# Patient Record
Sex: Male | Born: 1940 | Race: Black or African American | Hispanic: No | Smoking: Current every day smoker
Health system: Southern US, Community
[De-identification: ages and names within clinical notes are randomized; demographics above are authoritative.]

## PROBLEM LIST (undated history)

## (undated) DIAGNOSIS — I10 Essential (primary) hypertension: Secondary | ICD-10-CM

## (undated) DIAGNOSIS — Z794 Long term (current) use of insulin: Secondary | ICD-10-CM

## (undated) DIAGNOSIS — I219 Acute myocardial infarction, unspecified: Secondary | ICD-10-CM

## (undated) DIAGNOSIS — E1165 Type 2 diabetes mellitus with hyperglycemia: Secondary | ICD-10-CM

## (undated) DIAGNOSIS — C61 Malignant neoplasm of prostate: Secondary | ICD-10-CM

## (undated) DIAGNOSIS — R740 Nonspecific elevation of levels of transaminase and lactic acid dehydrogenase [LDH]: Secondary | ICD-10-CM

## (undated) DIAGNOSIS — E11621 Type 2 diabetes mellitus with foot ulcer: Secondary | ICD-10-CM

## (undated) DIAGNOSIS — R7401 Elevation of levels of liver transaminase levels: Secondary | ICD-10-CM

## (undated) DIAGNOSIS — I509 Heart failure, unspecified: Secondary | ICD-10-CM

## (undated) DIAGNOSIS — I2510 Atherosclerotic heart disease of native coronary artery without angina pectoris: Secondary | ICD-10-CM

## (undated) DIAGNOSIS — IMO0001 Reserved for inherently not codable concepts without codable children: Secondary | ICD-10-CM

## (undated) DIAGNOSIS — L97519 Non-pressure chronic ulcer of other part of right foot with unspecified severity: Secondary | ICD-10-CM

## (undated) DIAGNOSIS — I998 Other disorder of circulatory system: Secondary | ICD-10-CM

## (undated) DIAGNOSIS — I739 Peripheral vascular disease, unspecified: Secondary | ICD-10-CM

## (undated) DIAGNOSIS — I4891 Unspecified atrial fibrillation: Secondary | ICD-10-CM

## (undated) DIAGNOSIS — E785 Hyperlipidemia, unspecified: Secondary | ICD-10-CM

## (undated) HISTORY — PX: OTHER SURGICAL HISTORY: SHX169

## (undated) HISTORY — DX: Type 2 diabetes mellitus with hyperglycemia: E11.65

## (undated) HISTORY — DX: Reserved for inherently not codable concepts without codable children: IMO0001

## (undated) HISTORY — DX: Malignant neoplasm of prostate: C61

## (undated) HISTORY — DX: Hyperlipidemia, unspecified: E78.5

## (undated) HISTORY — DX: Type 2 diabetes mellitus with foot ulcer: E11.621

## (undated) HISTORY — DX: Long term (current) use of insulin: Z79.4

## (undated) HISTORY — PX: CORONARY ARTERY BYPASS GRAFT: SHX141

## (undated) HISTORY — PX: LEG AMPUTATION BELOW KNEE: SHX694

## (undated) HISTORY — DX: Non-pressure chronic ulcer of other part of right foot with unspecified severity: L97.519

## (undated) HISTORY — DX: Peripheral vascular disease, unspecified: I73.9

## (undated) HISTORY — PX: CLOSED REDUCTION CLAVICLE FRACTURE: SUR253

## (undated) HISTORY — DX: Other disorder of circulatory system: I99.8

---

## 1998-01-29 ENCOUNTER — Emergency Department (HOSPITAL_COMMUNITY): Admission: EM | Admit: 1998-01-29 | Discharge: 1998-01-29 | Payer: Self-pay | Admitting: Emergency Medicine

## 1998-05-26 ENCOUNTER — Ambulatory Visit (HOSPITAL_COMMUNITY): Admission: RE | Admit: 1998-05-26 | Discharge: 1998-05-26 | Payer: Self-pay | Admitting: Gastroenterology

## 1998-06-28 ENCOUNTER — Encounter: Payer: Self-pay | Admitting: Emergency Medicine

## 1998-06-28 ENCOUNTER — Inpatient Hospital Stay (HOSPITAL_COMMUNITY): Admission: EM | Admit: 1998-06-28 | Discharge: 1998-07-08 | Payer: Self-pay | Admitting: Emergency Medicine

## 1998-07-01 ENCOUNTER — Encounter: Payer: Self-pay | Admitting: Thoracic Surgery (Cardiothoracic Vascular Surgery)

## 1998-07-02 ENCOUNTER — Encounter: Payer: Self-pay | Admitting: Thoracic Surgery (Cardiothoracic Vascular Surgery)

## 1998-07-03 ENCOUNTER — Encounter: Payer: Self-pay | Admitting: Thoracic Surgery (Cardiothoracic Vascular Surgery)

## 1998-07-04 ENCOUNTER — Encounter: Payer: Self-pay | Admitting: Emergency Medicine

## 1998-07-05 ENCOUNTER — Encounter: Payer: Self-pay | Admitting: Thoracic Surgery (Cardiothoracic Vascular Surgery)

## 1998-07-11 ENCOUNTER — Encounter: Payer: Self-pay | Admitting: Thoracic Surgery (Cardiothoracic Vascular Surgery)

## 1998-07-11 ENCOUNTER — Emergency Department (HOSPITAL_COMMUNITY): Admission: EM | Admit: 1998-07-11 | Discharge: 1998-07-11 | Payer: Self-pay | Admitting: Emergency Medicine

## 1998-07-12 ENCOUNTER — Inpatient Hospital Stay (HOSPITAL_COMMUNITY)
Admission: AD | Admit: 1998-07-12 | Discharge: 1998-07-14 | Payer: Self-pay | Admitting: Thoracic Surgery (Cardiothoracic Vascular Surgery)

## 2001-04-02 ENCOUNTER — Emergency Department (HOSPITAL_COMMUNITY): Admission: EM | Admit: 2001-04-02 | Discharge: 2001-04-02 | Payer: Self-pay | Admitting: *Deleted

## 2001-10-25 ENCOUNTER — Emergency Department (HOSPITAL_COMMUNITY): Admission: EM | Admit: 2001-10-25 | Discharge: 2001-10-25 | Payer: Self-pay | Admitting: Emergency Medicine

## 2001-10-28 ENCOUNTER — Encounter: Payer: Self-pay | Admitting: Internal Medicine

## 2001-10-28 ENCOUNTER — Inpatient Hospital Stay (HOSPITAL_COMMUNITY): Admission: AD | Admit: 2001-10-28 | Discharge: 2001-11-01 | Payer: Self-pay | Admitting: Internal Medicine

## 2002-08-31 ENCOUNTER — Emergency Department (HOSPITAL_COMMUNITY): Admission: EM | Admit: 2002-08-31 | Discharge: 2002-08-31 | Payer: Self-pay | Admitting: Emergency Medicine

## 2003-03-22 ENCOUNTER — Emergency Department (HOSPITAL_COMMUNITY): Admission: AD | Admit: 2003-03-22 | Discharge: 2003-03-22 | Payer: Self-pay | Admitting: Family Medicine

## 2003-04-19 ENCOUNTER — Emergency Department (HOSPITAL_COMMUNITY): Admission: EM | Admit: 2003-04-19 | Discharge: 2003-04-19 | Payer: Self-pay | Admitting: Emergency Medicine

## 2004-02-08 ENCOUNTER — Emergency Department (HOSPITAL_COMMUNITY): Admission: EM | Admit: 2004-02-08 | Discharge: 2004-02-08 | Payer: Self-pay | Admitting: Emergency Medicine

## 2004-02-18 ENCOUNTER — Emergency Department (HOSPITAL_COMMUNITY): Admission: EM | Admit: 2004-02-18 | Discharge: 2004-02-18 | Payer: Self-pay | Admitting: Emergency Medicine

## 2004-03-02 ENCOUNTER — Ambulatory Visit (HOSPITAL_COMMUNITY): Admission: RE | Admit: 2004-03-02 | Discharge: 2004-03-02 | Payer: Self-pay | Admitting: Cardiology

## 2005-05-15 ENCOUNTER — Encounter: Admission: RE | Admit: 2005-05-15 | Discharge: 2005-05-15 | Payer: Self-pay | Admitting: Gastroenterology

## 2005-08-31 ENCOUNTER — Encounter: Admission: RE | Admit: 2005-08-31 | Discharge: 2005-08-31 | Payer: Self-pay | Admitting: Orthopedic Surgery

## 2005-09-05 ENCOUNTER — Encounter: Admission: RE | Admit: 2005-09-05 | Discharge: 2005-09-05 | Payer: Self-pay | Admitting: Orthopedic Surgery

## 2005-09-13 ENCOUNTER — Inpatient Hospital Stay (HOSPITAL_COMMUNITY): Admission: RE | Admit: 2005-09-13 | Discharge: 2005-09-16 | Payer: Self-pay | Admitting: Vascular Surgery

## 2005-09-15 ENCOUNTER — Encounter (INDEPENDENT_AMBULATORY_CARE_PROVIDER_SITE_OTHER): Payer: Self-pay | Admitting: *Deleted

## 2005-10-03 ENCOUNTER — Inpatient Hospital Stay (HOSPITAL_COMMUNITY): Admission: RE | Admit: 2005-10-03 | Discharge: 2005-10-09 | Payer: Self-pay | Admitting: Orthopedic Surgery

## 2005-10-20 ENCOUNTER — Inpatient Hospital Stay (HOSPITAL_COMMUNITY): Admission: RE | Admit: 2005-10-20 | Discharge: 2005-10-25 | Payer: Self-pay | Admitting: Orthopedic Surgery

## 2005-10-20 ENCOUNTER — Encounter (INDEPENDENT_AMBULATORY_CARE_PROVIDER_SITE_OTHER): Payer: Self-pay | Admitting: Specialist

## 2005-10-23 ENCOUNTER — Ambulatory Visit: Payer: Self-pay | Admitting: Physical Medicine & Rehabilitation

## 2006-09-13 ENCOUNTER — Ambulatory Visit (HOSPITAL_COMMUNITY): Admission: RE | Admit: 2006-09-13 | Discharge: 2006-09-13 | Payer: Self-pay | Admitting: Orthopedic Surgery

## 2007-06-26 ENCOUNTER — Encounter: Admission: RE | Admit: 2007-06-26 | Discharge: 2007-07-17 | Payer: Self-pay | Admitting: Orthopedic Surgery

## 2007-12-25 ENCOUNTER — Encounter: Admission: RE | Admit: 2007-12-25 | Discharge: 2008-03-16 | Payer: Self-pay | Admitting: Orthopedic Surgery

## 2008-03-18 ENCOUNTER — Ambulatory Visit (HOSPITAL_COMMUNITY): Admission: RE | Admit: 2008-03-18 | Discharge: 2008-03-19 | Payer: Self-pay | Admitting: Orthopedic Surgery

## 2008-06-03 ENCOUNTER — Inpatient Hospital Stay (HOSPITAL_COMMUNITY): Admission: RE | Admit: 2008-06-03 | Discharge: 2008-06-06 | Payer: Self-pay | Admitting: Orthopedic Surgery

## 2008-08-10 ENCOUNTER — Encounter (HOSPITAL_BASED_OUTPATIENT_CLINIC_OR_DEPARTMENT_OTHER): Admission: RE | Admit: 2008-08-10 | Discharge: 2008-11-08 | Payer: Self-pay | Admitting: Internal Medicine

## 2008-08-12 ENCOUNTER — Inpatient Hospital Stay (HOSPITAL_COMMUNITY): Admission: EM | Admit: 2008-08-12 | Discharge: 2008-08-16 | Payer: Self-pay | Admitting: Emergency Medicine

## 2008-08-12 ENCOUNTER — Ambulatory Visit: Payer: Self-pay | Admitting: Cardiology

## 2008-08-14 ENCOUNTER — Encounter (INDEPENDENT_AMBULATORY_CARE_PROVIDER_SITE_OTHER): Payer: Self-pay | Admitting: Internal Medicine

## 2008-08-14 ENCOUNTER — Ambulatory Visit: Payer: Self-pay | Admitting: Vascular Surgery

## 2008-10-21 ENCOUNTER — Ambulatory Visit: Admission: RE | Admit: 2008-10-21 | Discharge: 2008-10-21 | Payer: Self-pay | Admitting: General Surgery

## 2008-10-21 ENCOUNTER — Ambulatory Visit: Payer: Self-pay | Admitting: Vascular Surgery

## 2008-11-10 ENCOUNTER — Encounter (HOSPITAL_BASED_OUTPATIENT_CLINIC_OR_DEPARTMENT_OTHER): Admission: RE | Admit: 2008-11-10 | Discharge: 2008-12-04 | Payer: Self-pay | Admitting: General Surgery

## 2008-11-11 ENCOUNTER — Ambulatory Visit (HOSPITAL_COMMUNITY): Admission: RE | Admit: 2008-11-11 | Discharge: 2008-11-11 | Payer: Self-pay | Admitting: General Surgery

## 2008-12-05 ENCOUNTER — Encounter (HOSPITAL_BASED_OUTPATIENT_CLINIC_OR_DEPARTMENT_OTHER): Admission: RE | Admit: 2008-12-05 | Discharge: 2009-03-05 | Payer: Self-pay | Admitting: General Surgery

## 2009-02-08 ENCOUNTER — Ambulatory Visit: Admission: RE | Admit: 2009-02-08 | Discharge: 2009-04-21 | Payer: Self-pay | Admitting: Radiation Oncology

## 2009-03-17 ENCOUNTER — Encounter (HOSPITAL_BASED_OUTPATIENT_CLINIC_OR_DEPARTMENT_OTHER): Admission: RE | Admit: 2009-03-17 | Discharge: 2009-04-19 | Payer: Self-pay | Admitting: General Surgery

## 2009-04-12 LAB — URINALYSIS, MICROSCOPIC - CHCC
Bilirubin (Urine): NEGATIVE
Ketones: NEGATIVE mg/dL
Protein: NEGATIVE mg/dL
Specific Gravity, Urine: 1.02 (ref 1.003–1.035)

## 2009-04-14 LAB — URINE CULTURE

## 2009-04-20 ENCOUNTER — Encounter (HOSPITAL_BASED_OUTPATIENT_CLINIC_OR_DEPARTMENT_OTHER): Admission: RE | Admit: 2009-04-20 | Discharge: 2009-07-15 | Payer: Self-pay | Admitting: General Surgery

## 2009-05-05 ENCOUNTER — Ambulatory Visit (HOSPITAL_BASED_OUTPATIENT_CLINIC_OR_DEPARTMENT_OTHER): Admission: RE | Admit: 2009-05-05 | Discharge: 2009-05-05 | Payer: Self-pay | Admitting: Urology

## 2009-05-25 ENCOUNTER — Ambulatory Visit: Admission: RE | Admit: 2009-05-25 | Discharge: 2009-06-25 | Payer: Self-pay | Admitting: Radiation Oncology

## 2009-07-16 ENCOUNTER — Encounter (HOSPITAL_BASED_OUTPATIENT_CLINIC_OR_DEPARTMENT_OTHER): Admission: RE | Admit: 2009-07-16 | Discharge: 2009-10-14 | Payer: Self-pay | Admitting: General Surgery

## 2009-08-03 ENCOUNTER — Ambulatory Visit: Payer: Self-pay | Admitting: Vascular Surgery

## 2009-09-01 ENCOUNTER — Ambulatory Visit: Payer: Self-pay | Admitting: Vascular Surgery

## 2009-10-15 ENCOUNTER — Encounter: Admission: RE | Admit: 2009-10-15 | Discharge: 2009-10-15 | Payer: Self-pay | Admitting: Family Medicine

## 2009-10-26 ENCOUNTER — Encounter (HOSPITAL_BASED_OUTPATIENT_CLINIC_OR_DEPARTMENT_OTHER): Admission: RE | Admit: 2009-10-26 | Discharge: 2009-12-16 | Payer: Self-pay | Admitting: General Surgery

## 2010-07-10 LAB — GLUCOSE, CAPILLARY
Glucose-Capillary: 148 mg/dL — ABNORMAL HIGH (ref 70–99)
Glucose-Capillary: 93 mg/dL (ref 70–99)
Glucose-Capillary: 124 mg/dL — ABNORMAL HIGH (ref 70–99)

## 2010-07-21 ENCOUNTER — Emergency Department (HOSPITAL_COMMUNITY)
Admission: EM | Admit: 2010-07-21 | Discharge: 2010-07-21 | Disposition: A | Discharge status: Home / Self Care | Payer: Medicare Other | Attending: Emergency Medicine | Admitting: Emergency Medicine

## 2010-07-21 ENCOUNTER — Emergency Department (HOSPITAL_COMMUNITY): Payer: Medicare Other

## 2010-07-21 DIAGNOSIS — C61 Malignant neoplasm of prostate: Secondary | ICD-10-CM | POA: Insufficient documentation

## 2010-07-21 DIAGNOSIS — E119 Type 2 diabetes mellitus without complications: Secondary | ICD-10-CM | POA: Insufficient documentation

## 2010-07-21 DIAGNOSIS — D649 Anemia, unspecified: Secondary | ICD-10-CM | POA: Insufficient documentation

## 2010-07-21 DIAGNOSIS — I252 Old myocardial infarction: Secondary | ICD-10-CM | POA: Insufficient documentation

## 2010-07-21 DIAGNOSIS — R10815 Periumbilic abdominal tenderness: Secondary | ICD-10-CM | POA: Insufficient documentation

## 2010-07-21 DIAGNOSIS — I2510 Atherosclerotic heart disease of native coronary artery without angina pectoris: Secondary | ICD-10-CM | POA: Insufficient documentation

## 2010-07-21 DIAGNOSIS — R110 Nausea: Secondary | ICD-10-CM | POA: Insufficient documentation

## 2010-07-21 DIAGNOSIS — K921 Melena: Secondary | ICD-10-CM | POA: Insufficient documentation

## 2010-07-21 DIAGNOSIS — I10 Essential (primary) hypertension: Secondary | ICD-10-CM | POA: Insufficient documentation

## 2010-07-21 DIAGNOSIS — N419 Inflammatory disease of prostate, unspecified: Secondary | ICD-10-CM | POA: Insufficient documentation

## 2010-07-21 DIAGNOSIS — K6289 Other specified diseases of anus and rectum: Secondary | ICD-10-CM | POA: Insufficient documentation

## 2010-07-21 DIAGNOSIS — Z79899 Other long term (current) drug therapy: Secondary | ICD-10-CM | POA: Insufficient documentation

## 2010-07-21 DIAGNOSIS — R112 Nausea with vomiting, unspecified: Secondary | ICD-10-CM

## 2010-07-21 DIAGNOSIS — R109 Unspecified abdominal pain: Secondary | ICD-10-CM | POA: Insufficient documentation

## 2010-07-21 LAB — POCT CARDIAC MARKERS
CKMB, poc: 4.9 ng/mL (ref 1.0–8.0)
Myoglobin, poc: 184 ng/mL (ref 12–200)
Troponin i, poc: <0.05 ng/mL (ref 0.00–0.09)

## 2010-07-21 LAB — CBC
HCT: 31.5 % — ABNORMAL LOW (ref 39.0–52.0)
Hemoglobin: 9.9 g/dL — ABNORMAL LOW (ref 13.0–17.0)
MCH: 22.4 pg — ABNORMAL LOW (ref 26.0–34.0)
MCHC: 31.4 g/dL (ref 30.0–36.0)
MCV: 71.3 fL — ABNORMAL LOW (ref 78.0–100.0)
Platelets: 356 10*3/uL (ref 150–400)
RBC: 4.42 MIL/uL (ref 4.22–5.81)
RDW: 14.6 % (ref 11.5–15.5)
WBC: 8.3 10*3/uL (ref 4.0–10.5)

## 2010-07-21 LAB — COMPREHENSIVE METABOLIC PANEL
ALT: 18 U/L (ref 0–53)
AST: 25 U/L (ref 0–37)
Albumin: 2.6 g/dL — ABNORMAL LOW (ref 3.5–5.2)
Alkaline Phosphatase: 70 U/L (ref 39–117)
BUN: 12 mg/dL (ref 6–23)
CO2: 27 mEq/L (ref 19–32)
Calcium: 8.7 mg/dL (ref 8.4–10.5)
Chloride: 105 mEq/L (ref 96–112)
Creatinine, Ser: 1.13 mg/dL (ref 0.4–1.5)
GFR calc Af Amer: >60 mL/min (ref >60)
GFR calc non Af Amer: >60 mL/min (ref >60)
Glucose, Bld: 196 mg/dL — ABNORMAL HIGH (ref 70–99)
Potassium: 3.8 mEq/L (ref 3.5–5.1)
Sodium: 137 mEq/L (ref 135–145)
Total Bilirubin: 0.5 mg/dL (ref 0.3–1.2)
Total Protein: 6.8 g/dL (ref 6.0–8.3)

## 2010-07-21 LAB — DIFFERENTIAL
Basophils Absolute: 0 10*3/uL (ref 0.0–0.1)
Basophils Relative: 0 % (ref 0–1)
Eosinophils Absolute: 0.1 10*3/uL (ref 0.0–0.7)
Eosinophils Relative: 1 % (ref 0–5)
Lymphocytes Relative: 22 % (ref 12–46)
Lymphs Abs: 1.8 10*3/uL (ref 0.7–4.0)
Monocytes Absolute: 0.6 10*3/uL (ref 0.1–1.0)
Monocytes Relative: 7 % (ref 3–12)
Neutro Abs: 5.9 10*3/uL (ref 1.7–7.7)
Neutrophils Relative %: 71 % (ref 43–77)

## 2010-07-21 LAB — URINE MICROSCOPIC-ADD ON

## 2010-07-21 LAB — URINALYSIS, ROUTINE W REFLEX MICROSCOPIC
Bilirubin Urine: NEGATIVE
Glucose, UA: NEGATIVE mg/dL
Ketones, ur: NEGATIVE mg/dL
Leukocytes, UA: NEGATIVE
Nitrite: NEGATIVE
Protein, ur: 30 mg/dL — AB
Specific Gravity, Urine: 1.025 (ref 1.005–1.030)
Urobilinogen, UA: 1 mg/dL (ref 0.0–1.0)
pH: 6 (ref 5.0–8.0)

## 2010-07-21 LAB — GLUCOSE, CAPILLARY: Glucose-Capillary: 128 mg/dL — ABNORMAL HIGH (ref 70–99)

## 2010-07-21 LAB — OCCULT BLOOD, POC DEVICE: Fecal Occult Bld: NEGATIVE

## 2010-07-21 LAB — LIPASE, BLOOD: Lipase: 24 U/L (ref 11–59)

## 2010-07-21 MED ORDER — IOHEXOL 300 MG/ML  SOLN
100.0000 mL | Freq: Once | INTRAMUSCULAR | Status: AC | PRN
  Administered 2010-07-21: 100 mL via INTRAVENOUS

## 2010-07-22 LAB — GC/CHLAMYDIA PROBE AMP, GENITAL
Chlamydia, DNA Probe: NEGATIVE
GC Probe Amp, Genital: NEGATIVE

## 2010-07-29 LAB — GLUCOSE, CAPILLARY
Glucose-Capillary: 127 mg/dL — ABNORMAL HIGH (ref 70–99)
Glucose-Capillary: 207 mg/dL — ABNORMAL HIGH (ref 70–99)
Glucose-Capillary: 222 mg/dL — ABNORMAL HIGH (ref 70–99)
Glucose-Capillary: 246 mg/dL — ABNORMAL HIGH (ref 70–99)
Glucose-Capillary: 249 mg/dL — ABNORMAL HIGH (ref 70–99)

## 2010-07-30 LAB — GLUCOSE, CAPILLARY
Glucose-Capillary: 190 mg/dL — ABNORMAL HIGH (ref 70–99)
Glucose-Capillary: 207 mg/dL — ABNORMAL HIGH (ref 70–99)
Glucose-Capillary: 211 mg/dL — ABNORMAL HIGH (ref 70–99)
Glucose-Capillary: 221 mg/dL — ABNORMAL HIGH (ref 70–99)
Glucose-Capillary: 222 mg/dL — ABNORMAL HIGH (ref 70–99)
Glucose-Capillary: 224 mg/dL — ABNORMAL HIGH (ref 70–99)
Glucose-Capillary: 226 mg/dL — ABNORMAL HIGH (ref 70–99)
Glucose-Capillary: 231 mg/dL — ABNORMAL HIGH (ref 70–99)
Glucose-Capillary: 249 mg/dL — ABNORMAL HIGH (ref 70–99)
Glucose-Capillary: 110 mg/dL — ABNORMAL HIGH (ref 70–99)
Glucose-Capillary: 127 mg/dL — ABNORMAL HIGH (ref 70–99)
Glucose-Capillary: 135 mg/dL — ABNORMAL HIGH (ref 70–99)
Glucose-Capillary: 148 mg/dL — ABNORMAL HIGH (ref 70–99)
Glucose-Capillary: 176 mg/dL — ABNORMAL HIGH (ref 70–99)
Glucose-Capillary: 217 mg/dL — ABNORMAL HIGH (ref 70–99)
Glucose-Capillary: 218 mg/dL — ABNORMAL HIGH (ref 70–99)
Glucose-Capillary: 219 mg/dL — ABNORMAL HIGH (ref 70–99)
Glucose-Capillary: 241 mg/dL — ABNORMAL HIGH (ref 70–99)
Glucose-Capillary: 50 mg/dL — ABNORMAL LOW (ref 70–99)

## 2010-07-31 LAB — GLUCOSE, CAPILLARY
Glucose-Capillary: 100 mg/dL — ABNORMAL HIGH (ref 70–99)
Glucose-Capillary: 162 mg/dL — ABNORMAL HIGH (ref 70–99)
Glucose-Capillary: 194 mg/dL — ABNORMAL HIGH (ref 70–99)
Glucose-Capillary: 200 mg/dL — ABNORMAL HIGH (ref 70–99)

## 2010-07-31 LAB — COMPREHENSIVE METABOLIC PANEL
Albumin: 3.3 g/dL — ABNORMAL LOW (ref 3.5–5.2)
BUN: 26 mg/dL — ABNORMAL HIGH (ref 6–23)
Calcium: 8.9 mg/dL (ref 8.4–10.5)
Creatinine, Ser: 1.27 mg/dL (ref 0.4–1.5)
Glucose, Bld: 253 mg/dL — ABNORMAL HIGH (ref 70–99)
Potassium: 4.3 mEq/L (ref 3.5–5.1)
Total Protein: 7.4 g/dL (ref 6.0–8.3)

## 2010-07-31 LAB — HEMOGLOBIN A1C
Hgb A1c MFr Bld: 8.7 % — ABNORMAL HIGH (ref 4.6–6.1)
Mean Plasma Glucose: 203 mg/dL

## 2010-08-03 LAB — BRAIN NATRIURETIC PEPTIDE
Pro B Natriuretic peptide (BNP): 161 pg/mL — ABNORMAL HIGH (ref 0.0–100.0)
Pro B Natriuretic peptide (BNP): 169 pg/mL — ABNORMAL HIGH (ref 0.0–100.0)
Pro B Natriuretic peptide (BNP): 268 pg/mL — ABNORMAL HIGH (ref 0.0–100.0)

## 2010-08-03 LAB — BASIC METABOLIC PANEL
BUN: 19 mg/dL (ref 6–23)
BUN: 20 mg/dL (ref 6–23)
CO2: 24 mEq/L (ref 19–32)
CO2: 26 mEq/L (ref 19–32)
Calcium: 8.9 mg/dL (ref 8.4–10.5)
Chloride: 104 mEq/L (ref 96–112)
Chloride: 105 mEq/L (ref 96–112)
Creatinine, Ser: 1.25 mg/dL (ref 0.4–1.5)
Creatinine, Ser: 1.3 mg/dL (ref 0.4–1.5)
Creatinine, Ser: 1.41 mg/dL (ref 0.4–1.5)
GFR calc Af Amer: >60 mL/min (ref >60)
GFR calc Af Amer: >60 mL/min (ref >60)
Potassium: 3.7 mEq/L (ref 3.5–5.1)
Sodium: 136 mEq/L (ref 135–145)

## 2010-08-03 LAB — GLUCOSE, CAPILLARY
Glucose-Capillary: 105 mg/dL — ABNORMAL HIGH (ref 70–99)
Glucose-Capillary: 120 mg/dL — ABNORMAL HIGH (ref 70–99)
Glucose-Capillary: 138 mg/dL — ABNORMAL HIGH (ref 70–99)
Glucose-Capillary: 165 mg/dL — ABNORMAL HIGH (ref 70–99)
Glucose-Capillary: 166 mg/dL — ABNORMAL HIGH (ref 70–99)
Glucose-Capillary: 174 mg/dL — ABNORMAL HIGH (ref 70–99)
Glucose-Capillary: 185 mg/dL — ABNORMAL HIGH (ref 70–99)
Glucose-Capillary: 186 mg/dL — ABNORMAL HIGH (ref 70–99)
Glucose-Capillary: 207 mg/dL — ABNORMAL HIGH (ref 70–99)
Glucose-Capillary: 54 mg/dL — ABNORMAL LOW (ref 70–99)
Glucose-Capillary: 84 mg/dL (ref 70–99)
Glucose-Capillary: 85 mg/dL (ref 70–99)
Glucose-Capillary: 92 mg/dL (ref 70–99)
Glucose-Capillary: 99 mg/dL (ref 70–99)

## 2010-08-03 LAB — CARDIAC PANEL(CRET KIN+CKTOT+MB+TROPI)
CK, MB: 4.3 ng/mL — ABNORMAL HIGH (ref 0.3–4.0)
CK, MB: 4.7 ng/mL — ABNORMAL HIGH (ref 0.3–4.0)
Relative Index: 1.1 (ref 0.0–2.5)
Relative Index: 1.2 (ref 0.0–2.5)
Total CK: 381 U/L — ABNORMAL HIGH (ref 7–232)
Troponin I: 0.01 ng/mL (ref 0.00–0.06)

## 2010-08-03 LAB — WOUND CULTURE

## 2010-08-03 LAB — CK TOTAL AND CKMB (NOT AT ARMC)
CK, MB: 5.3 ng/mL — ABNORMAL HIGH (ref 0.3–4.0)
Relative Index: 1.3 (ref 0.0–2.5)
Total CK: 408 U/L — ABNORMAL HIGH (ref 7–232)

## 2010-08-03 LAB — CBC
HCT: 28.6 % — ABNORMAL LOW (ref 39.0–52.0)
Hemoglobin: 9.2 g/dL — ABNORMAL LOW (ref 13.0–17.0)
MCHC: 32.1 g/dL (ref 30.0–36.0)
MCV: 71.3 fL — ABNORMAL LOW (ref 78.0–100.0)
Platelets: 319 10*3/uL (ref 150–400)
RBC: 4.05 MIL/uL — ABNORMAL LOW (ref 4.22–5.81)
RDW: 15.9 % — ABNORMAL HIGH (ref 11.5–15.5)
WBC: 8.6 10*3/uL (ref 4.0–10.5)

## 2010-08-03 LAB — COMPREHENSIVE METABOLIC PANEL
ALT: 31 U/L (ref 0–53)
Albumin: 2.8 g/dL — ABNORMAL LOW (ref 3.5–5.2)
Alkaline Phosphatase: 59 U/L (ref 39–117)
Chloride: 111 mEq/L (ref 96–112)
Potassium: 3.6 mEq/L (ref 3.5–5.1)
Sodium: 143 mEq/L (ref 135–145)
Total Bilirubin: 0.4 mg/dL (ref 0.3–1.2)
Total Protein: 6.9 g/dL (ref 6.0–8.3)

## 2010-08-03 LAB — DIFFERENTIAL
Basophils Relative: 0 % (ref 0–1)
Eosinophils Absolute: 0.1 10*3/uL (ref 0.0–0.7)
Lymphs Abs: 1.5 10*3/uL (ref 0.7–4.0)
Monocytes Relative: 7 % (ref 3–12)
Neutro Abs: 6.3 10*3/uL (ref 1.7–7.7)
Neutrophils Relative %: 74 % (ref 43–77)

## 2010-08-03 LAB — ANAEROBIC CULTURE

## 2010-08-03 LAB — SEDIMENTATION RATE: Sed Rate: 60 mm/hr — ABNORMAL HIGH (ref 0–16)

## 2010-08-03 LAB — LIPID PANEL
Cholesterol: 137 mg/dL (ref 0–200)
LDL Cholesterol: 82 mg/dL (ref 0–99)
Triglycerides: 58 mg/dL (ref <150)

## 2010-08-03 LAB — TSH: TSH: 1.629 u[IU]/mL (ref 0.350–4.500)

## 2010-08-03 LAB — APTT: aPTT: 32 seconds (ref 24–37)

## 2010-08-03 LAB — PROTIME-INR: INR: 1 (ref 0.00–1.49)

## 2010-08-05 NOTE — Consult Note (Signed)
NAME:  Philip Strong, Philip Strong NO.:  [id number]  MEDICAL RECORD NO.:  [id number]           PATIENT TYPE:  E  LOCATION:  [facility]                         FACILITY:  [facility]  PHYSICIAN:  Luis Abed, MD, FACCDATE OF BIRTH:  [birth year]  DATE OF CONSULTATION:  07/21/2010 DATE OF DISCHARGE:  07/21/2010                                CONSULTATION   PRIMARY CARDIOLOGIST:  Previously Dr. Roger Shelter.  PRIMARY CARE PROVIDER:  Dr. Sanda Linger.  PATIENT PROFILE:  A 70 year old African American male with prior history of coronary artery disease status post coronary artery bypass grafting in 2001 as well as peripheral vascular disease, presents to the ER with a 72-month history of progressive abdominal bloating, discomfort with nausea and vomiting.  PROBLEM LIST: 1. Abdominal pain.     a.     CT of the abdomen and pelvis performed today showing no      acute findings with mild cardiomegaly.  The aorta is calcified,      but not aneurysmal. 2. Coronary artery disease.     a.     Status post MI in 2001 with subsequent coronary artery      bypass grafting in the same year.     b.     August 14, 2008, 2-D echocardiogram, EF 55%-60% with moderate      LVH, grade 2 diastolic dysfunction, mild mitral regurgitation.      Mildly reduced RV function.  Mild-to-moderate tricuspid      regurgitation.  PASP 30 mmHg. 3. Peripheral vascular disease.     a.     Status post right BKA.     b.     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  The patient      refused AKA. 4. Insulin-dependent diabetes mellitus. 5. Hyperlipidemia. 6. Morbid obesity. 7. Prostate cancer status post seed implant in January 2011 followed     by Dr. Isabel Caprice. 8. Questionable history of TIA. 9. History of surgery of the right fifth finger.  ALLERGIES:  No known drug allergies.  HISTORY OF PRESENT ILLNESS:  A 70 year old [place] American male with the above problem list.  The patient is status post MI in 2001  with subsequent coronary artery bypass grafting.  He has had limited followup with Cardiology since then.  The patient also has peripheral vascular disease, status post right BKA with chronic nonhealing ulcer.  [facility] and has not seen there since August 2011.  Also, says he has not seen his primary care provider in a while, and his pending has changed to Dr. Sanda Linger.  Over the past month, the patient has intermittent epigastric and abdominal discomfort associated with nausea and occasional vomiting as well as bloating and gassy sensation.  Symptoms can last minutes to hour and improved with meals.  Over the past 3 or 4 days, symptoms have progressed and more frequent and also more likely be associated with nausea and vomiting.  The patient also reports progressive dysuria, occasional hematuria, and rectal pain when he urinating.  He is also noted bright red  blood on toilet paper when he wipes along with bowel movement.  For all of the reasons above, the patient presented to the [facility] ED.  He has not had any chest pain, pressure, tightness, squeezing, burning, anginal equivalent.  He also denies any recent dyspnea.  Despite his BKA, he does get around with using crutches and says he does sit-ups for 1 hour each day.  Here in the ED, the patient has had a CT of his abdomen and pelvis which shows no acute findings.  There is question of a T-wave changes on an isolated on one of his three 12-leads here and we have been asked to evaluate.  HOME MEDICATIONS: 1. Klor-Con 10 mEq daily. 2. Lasix 20 mg daily. 3. Norvasc 10 mg daily. 4. Lantus 6 units b.i.d. 5. Humalog 35 units t.i.d. with meals. 6. Lisinopril 40 mg daily. 7. Lovastatin 40 mg daily. 8. Toprol-XL 100 mg daily  FAMILY HISTORY:  Mother died at 51 with history of diabetes.  Father died of unknown age with "heart problems."  His 3 sisters with diabetes, 3 brothers with  hypertension.  SOCIAL HISTORY:  The patient lives in [place] by himself.  He is unemployed.  He previously smoked half a pack a day for only 8 or 9 years and quit 6 to 7 years ago.  He has not had any alcohol recently and only ever drank socially previously.  He smokes 5 marijuana joints a year.  REVIEW OF SYSTEMS:  Positive for abdominal discomfort, nausea, vomiting, or abdominal bloating.  He has dysuria with occasional hematuria.  He has noticed bright red blood on toilet paper when he wipes.  He has also had rectal discomfort with urination.  He is full code.  Otherwise, all systems reviewed and negative.  PHYSICAL EXAMINATION:  VITAL SIGNS:  Temperature 98, heart rate 61, respirations 18, blood pressure 175/52, pulse ox 100% on room air. GENERAL:  Pleasant, African American male, in no acute distress.  Awake, alert, and oriented x3.  Has a normal affect. HEENT:  Normal nares, grossly intact, nonfocal. SKIN:  Warm and dry without lesions or masses. NECK:  Supple and obese.  Difficult to assess JVP.  No bruits. LUNGS:  Respirations are unlabored.  Clear to auscultation. CARDIAC:  Regular.  Distant S1-S2.  No S3, S4, or murmurs. ABDOMEN:  Obese, soft, with mild central abdominal tenderness with deep palpation.  Bowel sounds present x4. EXTREMITIES:  Notable for a right BKA.  His left lower extremity is somewhat tight with trace to 1+ left lower extremity edema to the mid ankle.  Left DP is 1+.  CT of the abdomen and pelvis showed no acute findings, mild cardiomegaly, aorta is calcified and nonaneurysmal.  EKG, sinus bradycardia.  He has Q in III and aVF.  No acute ST-T changes.  On one of his three EKGs here in the ER, he has biphasic T on one isolated beat in inferior leads, but rhythm strip shows that is isolated to this one beat.  Hemoglobin 9.9, hematocrit 31.5, WBC 8.3, platelets 356.  Sodium 137, potassium 3.8, chloride 105, CO2 27, BUN 12, creatinine 1.13, glucose 196,  total bilirubin 0.5, alkaline phosphatase 70, AST 25, ALT 18, total protein 6.8, albumin 2.6, lipase 24, CK-MB 4.9, troponin-I less than 0.05.  Urinalysis negative.  Calcium 8.7.  ASSESSMENT: 1. Abdominal/epigastric pain.  The patient has a 6-month history of     progressive abdominal epigastric discomfort associated with     bloating, nausea, and  vomiting.  This prompted ER visit today.  The     patient denies any recent chest pain or dyspnea.  He says he does     not have a prior anginal equivalent.  EKG has been reviewed and     that shows no evidence of ischemic changes or other objective     findings of ischemia.  Point-of-care markers are negative.     Recommend primary care and GI followup along with initiation of the     PPI as symptoms do improve with meals and are concerning for     possible peptic ulcer disease.  No further ischemic evaluation once     at this time. 2. Dysuria with rectal pain.  The patient has history of prostate     cancer.  Consider urologic evaluation. 3. History of bright red blood per rectum.  The patient says this is     present when he wipes.  He also has rectal discomfort.  Question     hemorrhoids.  Defer to primary care/GI.     Nicolasa Ducking, ANP   ______________________________ Luis Abed, MD, [facility]    CB/MEDQ  D:  07/21/2010  T:  07/22/2010  Job:  [id number]  Electronically Signed by Nicolasa Ducking ANP on 08/02/2010 04:11:59 PM Electronically Signed by Willa Rough MD FACC on 08/05/2010 02:47:45 PM

## 2010-08-09 LAB — GLUCOSE, CAPILLARY
Glucose-Capillary: 275 mg/dL — ABNORMAL HIGH (ref 70–99)
Glucose-Capillary: 280 mg/dL — ABNORMAL HIGH (ref 70–99)
Glucose-Capillary: 352 mg/dL — ABNORMAL HIGH (ref 70–99)
Glucose-Capillary: 79 mg/dL (ref 70–99)
Glucose-Capillary: 80 mg/dL (ref 70–99)
Glucose-Capillary: 83 mg/dL (ref 70–99)
Glucose-Capillary: 87 mg/dL (ref 70–99)

## 2010-08-09 LAB — COMPREHENSIVE METABOLIC PANEL
BUN: 22 mg/dL (ref 6–23)
CO2: 22 mEq/L (ref 19–32)
Chloride: 106 mEq/L (ref 96–112)
Creatinine, Ser: 1.19 mg/dL (ref 0.4–1.5)
GFR calc non Af Amer: >60 mL/min (ref >60)
Glucose, Bld: 239 mg/dL — ABNORMAL HIGH (ref 70–99)
Total Bilirubin: 0.5 mg/dL (ref 0.3–1.2)

## 2010-08-09 LAB — HEMOGLOBIN A1C: Mean Plasma Glucose: 217 mg/dL

## 2010-08-09 LAB — CBC
HCT: 30 % — ABNORMAL LOW (ref 39.0–52.0)
MCV: 72.2 fL — ABNORMAL LOW (ref 78.0–100.0)
RBC: 4.16 MIL/uL — ABNORMAL LOW (ref 4.22–5.81)
WBC: 7.8 10*3/uL (ref 4.0–10.5)

## 2010-08-09 LAB — GLUCOSE, RANDOM: Glucose, Bld: 595 mg/dL (ref 70–99)

## 2010-08-09 LAB — PROTIME-INR: Prothrombin Time: 12.9 seconds (ref 11.6–15.2)

## 2010-08-26 ENCOUNTER — Ambulatory Visit: Payer: Medicare Other | Admitting: Internal Medicine

## 2010-09-06 NOTE — Op Note (Signed)
NAME:  Philip Strong, Philip Strong NO.:  [id number]   MEDICAL RECORD NO.:  [id number]          PATIENT TYPE:  INP   LOCATION:  5031                         FACILITY:  [facility]   PHYSICIAN:  Nadara Mustard, MD     DATE OF BIRTH:  Apr 15, 1941   DATE OF PROCEDURE:  03/18/2008  DATE OF DISCHARGE:                               OPERATIVE REPORT   POSTOPERATIVE DIAGNOSIS:  Chronic wound, right transtibial amputation.   POSTOPERATIVE DIAGNOSIS:  Chronic wound, right transtibial amputation.   PROCEDURE:  1. Revision of right transtibial amputation.  2. Cultures obtained x2.  3. Wound VAC applied.   SURGEON:  Nadara Mustard, MD   ANESTHESIA:  General.   ESTIMATED BLOOD LOSS:  Minimal.   ANTIBIOTICS:  2 g of Kefzol.   DRAINS:  None.   COMPLICATIONS:  None.   SPECIMEN:  None.   DISPOSITION:  To PACU in stable condition.   INDICATIONS FOR PROCEDURE:  The patient is a 70 year old gentleman who  is status post a right transtibial amputation.  He has had chronic ulcer  which has failed to heal despite conservative wound care measures as  well as p.o. antibiotics.  He presented at this time for revision of the  amputation.  Risks and benefits were discussed including infection,  neurovascular injury, nonhealing wound, and need for additional surgery.  The patient states he understands and wished to proceed at this time.   DESCRIPTION OF PROCEDURE:  The patient was brought to OR room #1 after  undergoing a general anesthetic.  After adequate level of anesthesia  obtained, the patient's right lower extremity was prepped using DuraPrep  and draped into a sterile field.  The chronic wound was ellipsed out in  1 block of tissue.  There was nonviable muscle deep within the wound and  this was also debrided back to viable muscle edges.  There was no  purulence.  The wound was irrigated with normal saline.  Cultures were  obtained.  The wound was closed using a far-near-near-far  suture  with 2-0 nylon.  The wound was covered with a wound VAC, set to -  75 mm of continuous suction.  The patient was extubated and taken to  PACU in stable condition.  Plan for 24-hour observation.  Discharge to  home in the morning.      Nadara Mustard, MD  Electronically Signed     MVD/MEDQ  D:  03/18/2008  T:  03/18/2008  Job:  [id number]

## 2010-09-06 NOTE — Assessment & Plan Note (Signed)
[facility]   NAME:  Philip Strong, DEC NO.:  [id number]   MEDICAL RECORD NO.:  [id number]      DATE OF BIRTH:  28-Jan-1941   PHYSICIAN:  Joanne Gavel, M.D.         VISIT DATE:  10/07/2008                                   OFFICE VISIT   HISTORY OF PRESENT ILLNESS:  This 70 year old male status post right BKA  with a large wound on medial aspect of the amputation stump.  The pain  is quite severe and there has been no odor or drainage.   PHYSICAL EXAMINATION:  Temperature 97.7, pulse 72, respirations 20,  blood pressure 140/70.  The lateral wound of the stump has essentially  healed.  This is covered with a scab.  The medial wound is essentially  unchanged in size, but there appeared to be some epithelialization at  the margins.  The middle of the wound has a very thick slough.  He will  not allow me even to do try to debride this.   PLAN:  Switch from hydrogel and PROMOGRAN to Santyl.  He will change  this three times a week.  We will see him in 7 days.      Joanne Gavel, M.D.  Electronically Signed     RA/MEDQ  D:  10/07/2008  T:  10/08/2008  Job:  [id number]

## 2010-09-06 NOTE — Procedures (Signed)
DUPLEX DEEP VENOUS EXAM - LOWER EXTREMITY   INDICATION:  Nonhealing right stump.   HISTORY:  Edema:  yes  Trauma/Surgery:  yes  Pain:  yes  PE:  no  Previous DVT:  no  Anticoagulants:  Other:   DUPLEX EXAM:                CFV   SFV   PopV  PTV    GSV                R  L  R  L  R  L  R   L  R  L  Thrombosis    0     0     0  Spontaneous   +     +     +  Phasic        +     +     +  Augmentation  +     +     +  Compressible  +     +     +  Competent     +     +     +   Legend:  + - yes  o - no  p - partial  D - decreased   IMPRESSION:  No evidence of deep vein thrombosis noted in the right  stump.    _____________________________  Philip Strong, M.D.   MG/MEDQ  D:  08/03/2009  T:  08/04/2009  Job:  [id number]

## 2010-09-06 NOTE — Discharge Summary (Signed)
NAME:  EASON, HOUSMAN               ACCOUNT NO.:  [id number]   MEDICAL RECORD NO.:  [id number]          PATIENT TYPE:  INP   LOCATION:  4708                         FACILITY:  [facility]   PHYSICIAN:  Kerrin Champagne, M.D.   DATE OF BIRTH:  [birth year]   DATE OF ADMISSION:  08/12/2008  DATE OF DISCHARGE:  08/16/2008                               DISCHARGE SUMMARY   ADMISSION DIAGNOSES:  1. 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  2. Diabetes mellitus.  3. Coronary artery disease, status post coronary artery bypass graft.  4. Hypertension  5. Hyperlipidemia.  6. History of myocardial infarction.  7. History of peripheral vascular disease, status post below-knee      amputation with wound dehiscence followed by Dr. Lajoyce Corners.  8. Congestive heart failure exacerbation.   DISCHARGE DIAGNOSES:  1. 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  2. Diabetes mellitus.  3. Coronary artery disease, status post coronary artery bypass graft.  4. Hypertension  5. Hyperlipidemia.  6. History of myocardial infarction.  7. History of peripheral vascular disease, status post below-knee      amputation with wound dehiscence followed by Dr. Lajoyce Corners.  8. Congestive heart failure exacerbation.  9. Pseudomonas infection to the right below-knee amputation wound.   PROCEDURE:  On August 14, 2008, the patient underwent incision and  debridement of right below-knee amputation stump wound medially  measuring 4 cm x 14 cm.  This was performed by Dr. Otelia Sergeant under general  anesthesia.   CONSULTATIONS:  1. Michiel Cowboy, MD, of Internal Medicine.  2. Di Kindle. Edilia Bo, MD, of Vascular Surgery.   BRIEF HISTORY:  The patient is a 70 year old male with known peripheral  vascular disease and diabetes  mellitus.  He underwent right great toe  amputation in June 2007, and then eventually required right below-knee  amputation.  He had stent placement in the right superficial femoral  artery by Dr. Arbie Cookey in 2007.  He has had chronic draining wound of the  right BKA.  This has been followed with Wound Care and Dr. Lajoyce Corners over the  past several months.  The patient was admitted to [facility] on August 12, 2008, when he presented to the emergency room with bilateral lower  extremity edema and exacerbation of congestive heart failure.  At that  time, he was having severe pain at the right BKA.  An exudative type of  purulent matter over the entire wound with yellow-green color was noted.  It was felt he would benefit from surgical intervention and eventually  underwent incision and debridement as stated above.   BRIEF HOSPITAL COURSE:  The patient initially was admitted by Dr.  Therisa Doyne, of [facility].  At that time, he was  evaluated for congestive heart failure with a 2-D echo, multiple labs as  well  as Lasix, strict I and O's and weight monitoring.  A consult was  obtained through the wound care nurse at [facility].  She recommended  that the patient be seen by Dr. Lajoyce Corners in consultation.  In Dr. Audrie Lia  absence, Dr. Otelia Sergeant saw the patient for evaluation of his chronic ulcer  of the right BKA.  Dr. Edilia Bo was notified for consult from a vascular  standpoint.  It was felt any vascular intervention would have little  impact on the circulation of his BKA and would be associated with  significant risk.  It was felt he would benefit from local debridement  and underwent the procedure by Dr. Otelia Sergeant on August 14, 2008.  During the  procedure, cultures were obtained.  Cultures eventually grew  Pseudomonas.  Wound care was started by the physical therapist during  the hospital stay with bedside pulsatile lavage and wet-to-dry dressing  changes.  The patient had less pain of his stump  following the  debridement.  He was seen by the physical therapist for mobility.  He  was felt stable for discharge to his home on August 16, 2008.  At that  time, he had no shortness of breath or cough.  He did remain on  telemetry and was noted to have 6 beats of V-TACH, but was asymptomatic.  His lungs were clear and heart rate was regular, otherwise.  From a  medical standpoint, his diabetes was stable as was his congestive heart  failure.  Arrangements were made through the case manager for home  health assistance for dressing changes and physical therapy.   PERTINENT LABORATORY VALUES:  On admission, the patient underwent  echocardiogram showing left ventricular ejection fraction 55-60%.  Otherwise, findings were without acute diagnosis.  EKG on admission  showed sinus rhythm, prolonged QT with no change since previous tracing  confirmed by Dr. Donette Larry.  CT scan of the right lower extremity showed  open wound along the medial aspect of the right leg with packing  material, wound in close proximity to the tibial, no abscess noted.  No  definite erosion changes of osteomyelitis, marked to diffuse osteopenia  noted.  Small wound along the lateral aspect of the BKA without  significant penetration.  CBC on admission with WBC 8.6 and repeat on  April 24 at 6.5.  Hemoglobin and hematocrit on admission 9.2 and 28.9.  Repeat values with hemoglobin 9.2 and hematocrit 28.6.  Sed rate on  April 24 was noted to be 60.  Chemistry studies on admission were within  normal limits with exception of glucose 153.  Repeat values showed  improvement in blood glucose values.  Albumin 2.8 on admission.  Hemoglobin A1c 7.4 on admission.  Cardiac markers showed CK and CK-MB,  MB mildly elevated with troponin values within normal limits.  BNP on  admission 169, repeat on April 22 was 268 and repeat on April 24 was  161.  Lipid profile showed values all within normal limits.  Wound  culture of the right BKA  with final growth of Pseudomonas aeruginosa  with no anaerobes isolated.   PLAN:  The patient was discharged to his home.  Arrangements were made  for home health wound care on a daily basis with normal saline packing.  He will follow up with Dr. Otelia Sergeant 1 week from discharge.  Follow up with  Dr. Donette Larry 1 week from discharge.  He was instructed to call the office  to arrange these appointments.  The patient will keep  his wound dry and  clean other than when he is having dressing changes by the home health  nurse.  Dietary restrictions include low-carbohydrate diet for his  diabetes.   MEDICATIONS:  1. Mevacor 40 mg daily.  2. Lasix 40 mg 2 daily for 1 week, then once daily.  3. Potassium 20 mEq daily.  4. Norvasc 10 mg one-half tablet daily.  5. Aspirin 81 mg daily.  6. Lisinopril 40 mg daily.  7. Lantus 60 units daily.  8. NovoLog sliding scale.  9. Metanx daily.  10.Percocet 1-2 every 4-6 hours as needed for pain.  11.Amaryl 2 mg daily at noon.   The patient will keep his BKA elevated.  He will call should he have  questions or concerns prior to his return office visit.   CONDITION ON DISCHARGE:  Stable.      Wende Neighbors, P.A.      Kerrin Champagne, M.D.  Electronically Signed    SMV/MEDQ  D:  09/17/2008  T:  09/18/2008  Job:  [id number]

## 2010-09-06 NOTE — H&P (Signed)
NAME:  Philip Strong, Philip Strong               ACCOUNT NO.:  [id number]   MEDICAL RECORD NO.:  [id number]          PATIENT TYPE:  INP   LOCATION:  1823                         FACILITY:  [facility]   PHYSICIAN:  Michiel Cowboy, MDDATE OF BIRTH:  Aug 12, 1940   DATE OF ADMISSION:  08/12/2008  DATE OF DISCHARGE:                              HISTORY & PHYSICAL   PRIMARY CARE Maximillion Gill:  Georgann Housekeeper, MD.   CHIEF COMPLAINT:  Shortness of breath.   The patient is a 70 year old gentleman who reports that over the past 2  weeks he had been having progressive worsening shortness of breath and  extremity edema which is somewhat improved.  He does not remember any  medications that he takes but states that he has been taking them as  prescribed.  No diet discrepancies, denies any chest pain.  He does have  a history of coronary artery disease and diabetes.  Otherwise, no  worsening shortness of breath on exertion, somewhat worsening orthopnea  though.  No abdominal swelling.  No fevers, no chills.  The patient does  endorse right leg pain.  He has a history of wound dehiscence there for  quite some time, treated repeatedly by Dr. Lajoyce Corners and followed by Dr. Lajoyce Corners  and apparently not healing well.  The patient has been recently on  antibiotics, but there is otherwise no diarrhea, no constipation.   REVIEW OF SYSTEMS:  Otherwise unremarkable.   PAST MEDICAL HISTORY:  Significant for:  1. Coronary artery disease status post CABG.  2. Diabetes.  3. Hypertension.  4. Hyperlipidemia.  5. History of myocardial infarction.  The patient does not have a      cardiologist.  6. Status post peripheral vascular disease and AKA amputation on the      right with dehiscence of the wound, followed by Dr. Lajoyce Corners.   SOCIAL HISTORY:  The patient does not smoke but does drink occasional  alcohol.  Does not abuse drugs.   FAMILY HISTORY:  Noncontributory.   ALLERGIES:  No known drug allergies.   MEDICATIONS:  The patient  takes some kind of fluid pill, he does not  know the dose.  Though, he does remember he takes Lantus 60 units in the  morning and 60 units at night and some insulin 30 units in the morning  but he cannot remember what the name of it is.   VITALS:  Temperature 98.4, blood pressure 166/70, pulse 86, respirations  20, saturating 100% on room air.  The patient appears to be in no acute  distress.  HEAD:  Nontraumatic.  Moist mucous membranes.  LUNGS:  Distant breath sounds bilaterally.  The patient is a very heavy  gentleman, but no crackles could be heard, no wheezes.  HEART:  Regular rate and rhythm, a slight systolic murmur is appreciated  about 2/6.  ABDOMEN:  Obese but nontender, nondistended.  LOWER EXTREMITIES:  Chronic edematous changes on left lower extremity  with no pitting edema but rather thickening of the skin and right lower  extremity surgically absent above the knee.  There is a poorly healed  wound,  there is some granulation tissue and somewhat foul discharge.  The patient states that has not changed for years now.  NEUROLOGIC:  Intact.  SKIN:  Significant for the wound above-mentioned.   LABS:  White blood cell count 8.6, hemoglobin 9.2, sodium 140, potassium  4.2, creatinine 1.25, BNP 169.  Chest x-ray showing sequelae of CABG,  mild of pulmonary edema and cardiomegaly, glucose 165.  Electrocardiogram showing sinus rhythm, heart rate 81.  There are Q-  waves in lead III, a small Q-wave in lead aVF.  No significant ST-  segment changes noted, no significant change from electrocardiogram on  November 2009.   ASSESSMENT AND PLAN:  1. This is a 70 year old gentleman with a past medical history      significant for coronary artery disease and diabetes who presents      with congestive heart failure exacerbation.  He does not carry      diagnosis of heart failure, per his records, but I, unfortunately,      cannot have an access to his office records.  He may just not  be      aware of his diagnosis.  He does not know any of his medications      besides his Lantus.  2. Congestive heart failure exacerbation.  Will cycle cardiac enzymes,      obtain 2-D echocardiogram, check TSH, give intravenous Lasix,      strict ins and outs, daily weight and orthostatics.  Make sure he      is on lisinopril.  Will need to restart his home medications as      soon as possible once the list is available.  3. Diabetes.  Will continue Lantus and sliding scale.  Unsure if he is      taking any oral medications for this.  4. History of hypertension.  Unsure what he takes at home but make      sure he is on lisinopril here.  5. History of coronary artery disease.  No chest pain, but the patient      is diabetic.  Will cycle cardiac enzymes since his heart failure      could be a presentation of coronary artery disease.  6. Right chronic leg wound.  Will check plain films for osteomyelitis.      Would strongly consider Dr. Audrie Lia opinion to see if his wound has      changed or not and if the patient would need a course of      antibiotics or not.  Will also call for wound consult.  7. Prophylaxis.  Protonix plus Lovenox.   Dr. Georgann Housekeeper, his primary care physician, will resume care in the  morning.      Michiel Cowboy, MD  Electronically Signed     AVD/MEDQ  D:  08/12/2008  T:  08/12/2008  Job:  [id number]   cc:   Georgann Housekeeper, MD

## 2010-09-06 NOTE — Procedures (Signed)
VASCULAR LAB EXAM   INDICATION:  Nonhealing right stump.   HISTORY:  Diabetes:  yes  Cardiac:  yes  Hypertension:  yes   EXAM:  Duplex of the right stump, history of above-knee amputation.   IMPRESSION:  Greater than 75% stenosis noted in the right mid  superficial femoral artery of the right stump.   ___________________________________________  Quita Skye. Hart Rochester, M.D.   MG/MEDQ  D:  08/03/2009  T:  08/04/2009  Job:  [id number]

## 2010-09-06 NOTE — Assessment & Plan Note (Signed)
[facility]   NAME:  Philip Strong, Philip Strong               ACCOUNT NO.:  [id number]   MEDICAL RECORD NO.:  [id number]      DATE OF BIRTH:  [birth year]   PHYSICIAN:  Joanne Gavel, M.D.         VISIT DATE:  10/14/2008                                   OFFICE VISIT   HISTORY OF PRESENT ILLNESS:  This is a 70 year old male with a right BKA  stump and a large wound.  This wound has been present for several years  has healed and broken down.   PHYSICAL EXAMINATION:  Today reveals, the lateral wound is healed.  The  medial wound is essentially unchanged.  There is some slough but  basically bright red and the patient will not allow any debridement,  even allow [place] to touch it.  He has been treated with Santyl for 7 days.   PLAN:  Continue Santyl, change 3 times a week.  I will order vascular  studies to see if there is an inflow problem.  This patient has made  very little progress in the 2 months that he has been coming to the  clinic for this particular wound.      Joanne Gavel, M.D.  Electronically Signed     RA/MEDQ  D:  10/14/2008  T:  10/14/2008  Job:  [id number]

## 2010-09-06 NOTE — Assessment & Plan Note (Signed)
[facility]   NAME:  Philip Strong, Philip Strong               ACCOUNT NO.:  [id number]   MEDICAL RECORD NO.:  [id number]      DATE OF BIRTH:  10-10-1940   PHYSICIAN:  Barry Dienes. Eloise Harman, M.D.     VISIT DATE:                                   OFFICE VISIT   SUBJECTIVE:  The patient is a 70 year old black man who was seen for  reevaluation of her right below-knee amputation stump infection.  He has  been treated with hyperbaric oxygen treatment which he is tolerating  very well.  He is enthusiastic about the progress today.  He has not had  significant pain in the area of the stump.   OBJECTIVE:  VITAL SIGNS:  Blood pressure 162/69, pulse 59, respirations  20, temperature 97.9, blood glucose level was 166.   The condition of the wound is as follows:  Wound #1:  It is located on the medial aspect of the right below-knee  amputation site just proximal to the distal most surface.  This wound  measures 12.5 cm x 2.9 cm x 0.4 cm.  The wound does not have significant  eschar but has minimal necrotic debris.  The wound base is red in color  with pink granulation tissue.  There was underlying muscle exposed but  no bone or tendon.  There was no foul odor and no significant drainage.  The periwound integrity was intact.   ASSESSMENT:  Interval improved appearance of right leg ulcer with  hyperbaric oxygen treatment.   PLAN:  After 5% lidocaine gel was applied to the ulcer, a #15-scalpel  blade was used to gently debride the necrotic slough from the wound.  This was accomplished without significant pain or discomfort.  Following  this, the wound was treated with Puracol, covered by hydrogel, covered  by a gauze, and wrapped in Kerlix.  They will be repeated in this  fashion every other day.  He will continue ongoing hyperbaric oxygen  treatments.  He should have a physician reevaluation in approximately 1  week.           ______________________________  Barry Dienes. Eloise Harman,  M.D.     DGP/MEDQ  D:  12/08/2008  T:  12/09/2008  Job:  [id number]

## 2010-09-06 NOTE — Assessment & Plan Note (Signed)
[facility]   NAME:  Philip Strong, Philip Strong               ACCOUNT NO.:  [id number]   MEDICAL RECORD NO.:  [id number]      DATE OF BIRTH:  1940-06-26   PHYSICIAN:  Jonelle Sports. Sevier, M.D.  VISIT DATE:  10/21/2008                                   OFFICE VISIT   HISTORY:  This 70 year old white male is being followed for ulcerations  of the right BKA stump, which originally had ulcerations both at the  lateral margin of the previous surgical incision and also much larger  open area immediately.  These have been treated with Santyl and then  more recently apparently with Silvadene cream.   He was seen by Dr. Wiliam Ke last week and was concerned about some problems  with vascular inflow and sent the patient for arterial studies, which  were indeed done this morning.  The sonographer's report, which has not  been reviewed and signed by the physician and which contains no  physician recommendation regarding any intervention, shows moderate  irregular plaque no noted throughout with significant stenoses in the  profunda artery, proximal to mid femoral artery and possibly the mid to  distal femoral artery.  The waveforms are monophasic from the common  femoral through the popliteal artery.   Despite this, the patient feels that the wound has gotten smaller and  this is verified as will be seen below.   PHYSICAL EXAMINATION:  VITAL SIGNS:  Blood pressure 130/87, pulse 82,  respirations 17, and temperature 98.1.   The more lateral wound has completely healed and the medial wound now  measures 3.0 x 10.0 x 0.2 cm with a good clean granular base.  This has  a significant reduction in size from his examination a week ago.   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   DISPOSITION:  Given that the patient is showing progressive improvement  more aggressive intervention is probably not indicated  at this point in  time.  Certainly, if he regresses at all, it will be an option to see if  the vascular surgeons feel like in opening of these vessels up and also  certainly the use of hyperbaric oxygen therapy would be in  consideration.   The wound cleaned with his continued improvement; however, our plan  today is simply to continue with dressing on a daily basis with  Silvadene cream, this to be applied after appropriate liquid soap  cleansing the wound on a daily basis and then this in turn covered with  a dry nonstick pad and a bulky dressing.   The patient will change this daily at home and will be seen again in 2  weeks or sooner if need be.           ______________________________  Jonelle Sports. Cheryll Cockayne, M.D.    RES/MEDQ  D:  10/21/2008  T:  10/22/2008  Job:  [id number]

## 2010-09-06 NOTE — Assessment & Plan Note (Signed)
[facility]   NAME:  Philip Strong, Philip Strong               ACCOUNT NO.:  [id number]   MEDICAL RECORD NO.:  [id number]      DATE OF BIRTH:  July 21, 1940   PHYSICIAN:  Joanne Gavel, M.D.         VISIT DATE:  09/02/2008                                   OFFICE VISIT   The patient had a BK amputation approximately 3 years ago.  The wound  has broken down several times and has closed several times.  Right now,  the patient has a sizable wound in the medial aspect of his amputation  and a much smaller wound, which is almost healed in the lateral aspect  of his amputation.  Last week, he was started on a Silvadene dressing.  I believe it will be better of with Santyl.   PLAN:  Santyl, moist gauze, and then wrap.  See in 7 days to consider  further debridement surgically.      Joanne Gavel, M.D.  Electronically Signed     RA/MEDQ  D:  09/02/2008  T:  09/02/2008  Job:  [id number]

## 2010-09-06 NOTE — Assessment & Plan Note (Signed)
[facility]   NAME:  Philip Strong, Philip Strong               ACCOUNT NO.:  [id number]   MEDICAL RECORD NO.:  [id number]      DATE OF BIRTH:  07-20-1940   PHYSICIAN:  Leonie Man, M.D.         VISIT DATE:                                   OFFICE VISIT   PROBLEM:  Wound dehiscence following right below-knee amputation with  nonhealing diabetic wound in this 70 year old man.  Curent Treatment:  Wet-to-dry saline gauze dressings and more recently, with Puracol .  The patient is also currently undergoing  hyperbaric oxygen therapy and  tolerating that well.   PHYSICAL EXAMINATION:  [name] is in no distress.  Temperature 97.8, pulse 55, respirations 18, and blood pressure 167/79.  Capillary blood glucose is 150.  The wound bed now is very clean and granulating with current wound  measurement: 3.6 x 12.5 x 0.5 cm.  Tthe wound does not require any debridement today.   TREATMENT:  Puracol, hydrogel dressings.   DISP  Sschedule Apligraft Pending approval.  I think this will more rapidly close his wound.      Leonie Man, M.D.  Electronically Signed     PB/MEDQ  D:  11/23/2008  T:  11/24/2008  Job:  [id number]

## 2010-09-06 NOTE — Assessment & Plan Note (Signed)
[facility]   NAME:  Philip Strong, Philip Strong NO.:  [id number]   MEDICAL RECORD NO.:  [id number]      DATE OF BIRTH:  09-09-1940   PHYSICIAN:  Leonie Man, M.D.         VISIT DATE:                                   OFFICE VISIT   PROBLEM:  Diabetic ulcer   HPI  Nonhealing right below-knee amputation wound in this 70 year old  diabetic man.  The patient had been treated for this stump with Santyl  wet-to-dry dressings.  He presents today again for wound evaluation.   EXAM  On examination today, he is afebrile, temperature 98.5, pulse 93,  respirations 20, blood pressure 146/76.   Capillary blood glucose is 114.  The wounds dimensions are smaller today with the right medial  transtibial wound at 4.3 x 12.5 x 0.4 cm and the lateral wound at 0.5 x  1.9 x 0.3.   </PROCEDURE/TREATMENT  Selective debridement on both these wounds for removal of a significant  amount of slough and necrotic material and fibrous tissue.  This was  debrided with a curette down to good clean granulations.   Wound dressing treatments with hydrogel and Promogran to both of these  wounds covered with a dry sterile dressing.  The patient will change  this every 3 days.   DISPOSITION:  Follow up in 1 week.      Leonie Man, M.D.  Electronically Signed     PB/MEDQ  D:  09/23/2008  T:  09/23/2008  Job:  [id number]

## 2010-09-06 NOTE — Assessment & Plan Note (Signed)
[facility]   NAME:  Philip Strong, Philip Strong               ACCOUNT NO.:  [id number]   MEDICAL RECORD NO.:  [id number]      DATE OF BIRTH:  [birth year]   PHYSICIAN:  Leonie Man, M.D.    VISIT DATE:  12/21/2008                                   OFFICE VISIT   PROBLEM:  Wound dehiscence following right below-knee amputation with  nonhealing diabetic wound in this 70 year old gentleman.  Current  treatment being hyperbaric oxygen and damp-to-dry gauze dressings.  More  recently, he has been having Puracol placed on the wound.   He comes in for reevaluation today.  His temperature is 98.3, pulse 73,  respirations 19, and blood pressure is 162/72.   The wound base is clean with no odor or any significant drainage.  I  have decided to go ahead and place an Apligraf on this wound, which was  ordered approximately 1 week ago.   However, the patient positioned supinely and the left stump area is  appropriately prepped, I prepared the Apligraf with multiple  fenestrations and applied it over the wound.  I cut off the excess and  carried it down to the remainder of the wound for complete coverage of  his stump wound.  This was covered with Mepitel and held in place with  Steri-Strips and a bulky gauze dressing was applied.  I will plan to see  the patient back in approximately 1 week for reevaluation.      Leonie Man, M.D.  Electronically Signed     PB/MEDQ  D:  12/21/2008  T:  12/22/2008  Job:  [id number]

## 2010-09-06 NOTE — Op Note (Signed)
NAME:  Philip Strong, Philip Strong               ACCOUNT NO.:  [id number]   MEDICAL RECORD NO.:  [id number]          PATIENT TYPE:  INP   LOCATION:  5008                         FACILITY:  [facility]   PHYSICIAN:  Nadara Mustard, MD     DATE OF BIRTH:  12/02/1940   DATE OF PROCEDURE:  06/03/2008  DATE OF DISCHARGE:                               OPERATIVE REPORT   PREOPERATIVE DIAGNOSIS:  Dehiscence, right transtibial amputation.   POSTOPERATIVE DIAGNOSIS:  Dehiscence, right transtibial amputation.   PROCEDURE:  Right transtibial amputation revision.   SURGEON:  Nadara Mustard, MD   ANESTHESIA:  General.   ESTIMATED BLOOD LOSS:  Minimal.   ANTIBIOTICS:  Vancomycin 1 g.   DRAINS:  None.   COMPLICATIONS:  None.   TOURNIQUET TIME:  None.   WOUND VAC:  Set at minus 75 mmHg.   DISPOSITION:  To PACU in stable condition.   INDICATIONS FOR PROCEDURE:  The patient is a 70 year old gentleman with  diabetic insensate neuropathy, status post transtibial amputation of the  right.  He has progressive wound dehiscence and breakdown and due to  failure of conservative care, the patient presents at this time for  revision transtibial amputation.  Risks and benefits were discussed  including infection, neurovascular injury, persistent pain, and need for  additional surgery.  The patient states he understands and wished to  proceed at this time.   DESCRIPTION OF PROCEDURE:  The patient was brought to OR room 1, and  underwent a general anesthetic.  After adequate level of anesthesia  obtained, the patient's right lower extremity was prepped using DuraPrep  and draped in a sterile field, and the dehiscence of the wound was  draped with an impervious Ioban, so this was not involved in the  surgical field.  An elliptical incision was made around the wound and  this was carried down to bone.  The tibia and fibula were resected  approximately 2 cm back to good healthy bone.  The entire wound was  removed in 1  block of tissue.  The wound was irrigated with normal  saline.  Hemostasis was obtained.  The wound was closed with #1 PDS with  both the deep and superficial fascial layers closed with #1 PDS.  The  skin was closed using a  modified vertical mattress suture as well as far-near, near-far suture  with 2-0 nylon.  The wound was then covered with a wound VAC sponge set  to minus 75 mmHg.  Discharged to PACU in stable condition.  Plan for  admission IV vancomycin and continuation of the wound VAC at minus 75  mmHg.      Nadara Mustard, MD  Electronically Signed     MVD/MEDQ  D:  06/03/2008  T:  06/03/2008  Job:  [id number]

## 2010-09-06 NOTE — Assessment & Plan Note (Signed)
[facility]   NAME:  Philip Strong, Philip Strong               ACCOUNT NO.:  [id number]   MEDICAL RECORD NO.:  [id number]      DATE OF BIRTH:  [birth year]   PHYSICIAN:  Joanne Gavel, M.D.         VISIT DATE:  11/11/2008                                   OFFICE VISIT   HISTORY OF PRESENT ILLNESS:  A 70 year old male with a BK stump  ulceration which has been present for several months.  He has been  treating the patient with Silvadene and just recently today has noticed  the onset of some pain.   PHYSICAL EXAMINATION:  Temperature 97.7, pulse 80, respirations 24, and  blood pressure 135/70.  The wound is 12.8 x 3.7 x 0.4.  It is bright red  in its base.  There is essentially no sign of healing.   I asked Dr. Lurene Shadow for consultation advice and he thinks we should stop  silver, change to Puracol, and set the patient up for HBO.  We will see  him in 7 days and consider HBO treatment.      Joanne Gavel, M.D.  Electronically Signed     RA/MEDQ  D:  11/11/2008  T:  11/11/2008  Job:  [id number]

## 2010-09-06 NOTE — Op Note (Signed)
NAME:  Philip Strong, Philip Strong               ACCOUNT NO.:  [id number]   MEDICAL RECORD NO.:  [id number]          PATIENT TYPE:  INP   LOCATION:  4708                         FACILITY:  [facility]   PHYSICIAN:  Kerrin Champagne, M.D.   DATE OF BIRTH:  [birth year]   DATE OF PROCEDURE:  08/14/2008  DATE OF DISCHARGE:                               OPERATIVE REPORT   PREOPERATIVE DIAGNOSIS:  Infected right below-knee amputation stump  medial wound, superficial involving skin and the fascial layers and  fatty layers.   POSTOPERATIVE DIAGNOSIS:  Infected right below-knee amputation stump  medial wound, superficial involving skin and the fascial layers and  fatty layers.   PROCEDURE:  Incision and debridement of right below-knee amputation  stump wound, medial, measuring 4 cm x 14 cm.   SURGEON:  Kerrin Champagne, MD   ANESTHESIA:  General via oral tracheal intubation, Dr. Leonides Grills.  Judie Petit.   ESTIMATED BLOOD LOSS:  25 mL.   DRAINS:  None.   TOURNIQUET TIME:  Zero minute.   BRIEF CLINICAL HISTORY:  The patient is a 70 year old male with  diabetes.  He has a history of peripheral vascular disease.  He was  treated for a right great toe amputation in June 2007, then on to right  below-knee amputation, and had stent placed in the right superficial  femoral artery by Dr. Tawanna Cooler Early in 2007.  He had problems with healing  of the wound.  He eventually went on to the wound dehiscence in November  2009.  He underwent open debridement of this area of his medial leg.  At  that time, this had persistent drainage and open wound since then a  period of nearly 6-8 months.  He is brought to the operating room as he  has been admitted to Medicine Service to undergo diuresis for anasarca  with bilateral lower extremity edema findings.  During this  hospitalization, requested consulted for evaluation of his stump.  His  attending physician, Dr. Aldean Baker is not available for evaluation.  He is found to  have an infection superficially involving the right  medial below-knee amputation stump.  He has severe pain with any  attempts at palpation or debridement of this area either in his room or  in the office.  It is felt that he required a general anesthetic in  order to undergo debridement of the open wound site.  The findings of  severe pain tended by history of peripheral vascular disease suggests  that this is a vascular type ulcer and is having difficulty healing.   INTRAOPERATIVE FINDINGS:  A exudative type purulent material over the  entire wound site with yellow green in coloration, underwent debridement  of edges and scraping of the wound down to more bleeding granulation  type tissue.  Cultures were obtained following the superficial  debridement, anaerobic and aerobic.   DESCRIPTION OF PROCEDURE:  After adequate general anesthesia, right  lower extremity was prepped with Betadine scrub and prep solution and  draped in the usual manner to the level of the knee.  Right leg was then  debrided  over the medial ulcer extending from the medial aspect of the  calf about 3 cm distal to the knee joint line across the anterior aspect  of the stump distally and medially.  The entire length of this open  wound is now 4 cm x 14 cm.  The entire length of this was debrided  sharply using a #10 blade scalpel and curettage using about a 1/4 inch  to 1/2 inch curette down to bleeding granulation tissue.  Following  this, cultures were obtained both anaerobic and aerobic.  The patient  then had a dressing of normal saline wet-to-dry applied.  Dressing of 4  x 4s soaked with saline applied to the open wound site and ABDs fixed to  the skin with Kerlix.  The patient was then reactivated, extubated, and  returned to the recovery room in satisfactory condition.      Kerrin Champagne, M.D.  Electronically Signed     JEN/MEDQ  D:  08/14/2008  T:  08/15/2008  Job:  [id number]

## 2010-09-06 NOTE — Discharge Summary (Signed)
NAME:  BRANDOL, CORP               ACCOUNT NO.:  [id number]   MEDICAL RECORD NO.:  [id number]          PATIENT TYPE:  INP   LOCATION:  4708                         FACILITY:  [facility]   PHYSICIAN:  Georgann Housekeeper, MD      DATE OF BIRTH:  06-09-1940   DATE OF ADMISSION:  08/12/2008  DATE OF DISCHARGE:  08/16/2008                               DISCHARGE SUMMARY   DISCHARGE DIAGNOSES:  1. Volume overload diastolic dysfunction.  2. Leg edema.  3. Right below-knee amputation with stump wound, slow to heal, status      post surgical debridement by Orthopedic.  4. Renal insufficiency.  5. History of coronary artery disease.  6. Hypertension.  7. History of diabetes.   DISCHARGE MEDICATIONS:  1. Mevacor 40 mg daily.  2. Lasix 40 mg b.i.d. for 1 week, then daily.  3. Potassium 20 mEq daily.  4. Norvasc 5 mg daily.  5. Aspirin 81 mg daily.  6. Lisinopril 40 mg daily.  7. Lantus 50 units b.i.d.  8. NovoLog sliding scale with meals.  9. Metanx 1 tablet daily.  10.Percocet 1-2 q.6 p.r.n.  11.Amaryl 2 mg 1 tablet at noon.   Continue wound care as per home health and Wound Clinic to the right  BKA.   CONSULTATION:  Orthopedic.   PROCEDURES:  A 2-D echo with results suggesting LV function 55-60% with  diastolic defects consistent with diastolic dysfunction.   LABORATORY DATA:  His white count was normal.  Hemoglobin 9.2,  creatinine 1.4.  LFTs normal.  Hemoglobin A1c 7.4.  Cardiac markers  negative.  Cholesterol total 137, LDL 82, HDL 43.  TSH 1.6.   HOSPITAL COURSE:  A 70 year old male admitted with leg edema worsening,  no shortness of breath.  1. History of CAD, underwent 2-D echo.  He ruled out for an MI.  Echo      showed diastolic dysfunction, was put on IV Lasix and then switched      to p.o., did well with the swelling.  He still has significant      edema on the right thigh around the stump and the wound.  Continued      on Lasix and potassium.  Electrolytes remained stable.  2. His right BKA continues to have poor wound healing.  He had been      consulted by Vascular and did not think any vascular intervention      at this point.  He was seen by Orthopedic Dr. Otelia Sergeant for Dr. Lajoyce Corners,      who is his primary orthopedic and underwent some surgical cleaning      and debridement.  No evidence of any infection.  He has continued      with the wound care as well [facility] Clinic for followup of the wound.      He tolerated the procedure well in the OR.  Please refer to the      orthopedic note for details.  3. As far as the diabetes, continue on Lantus and sliding scale.  A1c      was 7.4.  4. History  of hypertension.  Blood pressure remained stable in 130s to      150s as the patient was discharged home in stable and improved      condition.  Followup outpatient with the [facility]ound Clinic as well as      Orthopedic Dr. Otelia Sergeant or Dr. Lajoyce Corners, and I will see him back in 1-2      weeks.      Georgann Housekeeper, MD  Electronically Signed     KH/MEDQ  D:  09/23/2008  T:  09/23/2008  Job:  [id number]

## 2010-09-06 NOTE — Assessment & Plan Note (Signed)
[facility]   NAME:  Philip Strong, Philip Strong               ACCOUNT NO.:  [id number]   MEDICAL RECORD NO.:  [id number]      DATE OF BIRTH:  January 07, 1941   PHYSICIAN:  Lenon Curt. Chilton Si, M.D.   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  The patient was subsequently admitted for an  infected right BKA stump.  He underwent operative procedure to incise  and debride the stump area by Dr. Vira Browns.  The patient remained in  the hospital in aggregate less than 1 week and now returns to Wound Care  for continued treatment.   While in the hospital, lab work indicated a hemoglobin A1c of 9.2 and a  glucose of 595.   PHYSICAL EXAMINATION:  VITAL SIGNS:  Temp 97.6, pulse 76, respirations  20, and blood pressure 126/76.  Capillary glucose 118.  EXTREMITIES:  Wound #1 in the medial right leg stump area shows a wound  5 x 14 x 0.4 cm.  There is a wound #2 laterally in the right stump area  measuring 0.2 x 2.8 x 0.3 cm.  The largest wound is wide open with fatty  tissue exposed.  It does appear reasonably clean.  There is not a lot of  debris on the wound, and there is only moderate amount of drainage.   TREATMENT:  I recultured the right stump area because of possibility of  cellulitis.  Following this, we applied Silvadene, wet gauze, a bulky  wrap, and Coban outer wrap.  This should be done daily.  The patient  states he is already on an antibiotic and is supposed to call to let us  know what he is currently using.   Because of the wide open nature of this wound, we have recommended that  he return Sep 02, 2008 for repeat evaluation.  Culture report should be  back by then, and hopefully, we will know what antibiotic he is taking.   ICD-9 #998.30 disruption of the wound.  CPT  [id number].      Lenon Curt Chilton Si, M.D.  Electronically Signed     AGG/MEDQ  D:  08/28/2008  T:  08/29/2008  Job:  [id number]

## 2010-09-06 NOTE — Assessment & Plan Note (Signed)
[facility]   NAME:  Philip Strong, Philip Strong               ACCOUNT NO.:  [id number]   MEDICAL RECORD NO.:  [id number]      DATE OF BIRTH:  Sep 12, 1940   PHYSICIAN:  Jonelle Sports. Sevier, M.D.  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  Several weeks ago, he had a  septic episode, this was treated aggressively and since that time with  conventional wound therapy.  He has shown continued improvement.  Most  recently, he has been using Silvadene on these wounds, although the  records show that our intention was that he would have been on Santyl.   PHYSICAL EXAMINATION:  Blood pressure 151/72, pulse 75, respirations not  recorded, temperature 97.7, blood glucose 97.  On the medial aspect of  his wound, he has a large opened area measuring 5.5 x 13.0 x 0.4 cm with  a reasonable granular base, but still some areas of slough.  He has  linear more lateral lesion measuring 0.6 x 2.2 x 0.3 cm that has a  considerable slough in the base.   IMPRESSION:  Continued improvement wounds of right to below-knee  amputation stump.   DISPOSITION:  The wounds today are debrided selectively of the slough  and I am able to get them reasonably clean.  I also took some crust from  the central portion of the wound, which has healed.   The wound was then dressed with an application of Santyl covered with  moist gauze and a protective wrap.   This will be changed on a daily basis at home.   The patient will complete the Cipro, which he has previously been given  having 2 days worth of therapy remaining.   Followup visit will be given here for 2 weeks sooner p.r.n.           ______________________________  Jonelle Sports. Cheryll Cockayne, M.D.     RES/MEDQ  D:  09/16/2008  T:  09/16/2008  Job:  [id number]

## 2010-09-06 NOTE — Assessment & Plan Note (Signed)
OFFICE VISIT   Philip Strong, Philip Strong  DOB:  10-20-1940                                       09/01/2009  ZJIRC#:[id number]   The patient is a 70 year old male referred by Dr. 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  The patient had a right below knee  amputation four years ago by Dr. Lajoyce Corners.  He states that this never  completely healed.  He has been undergoing local wound care since that  time.  He has undergone several treatments including a VAC which did not  help and several different types of therapy with local wound care.  He  has had no problems in the left leg.  He denies any prior interventions  in the right leg.  However, from our records he previously underwent a  right superficial femoral artery stent in May of 2007 by Dr. Arbie Cookey.  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   The patient denies any pain in his stump.  He states that the wound has  been improving over the last few weeks.  He states he has had more  improved in the last few weeks then in the last year.  He has been using  compression therapy in his right leg to decrease the amount of fluid on  the right side.  He denies any fever or chills.   CHRONIC MEDICAL PROBLEMS:  Include diabetes, hypertension, coronary  artery disease and elevated cholesterol, all of which are currently  controlled.  Past medical history is otherwise unremarkable.   PAST SURGICAL HISTORY:  He denies.   FAMILY HISTORY:  Remarkable for his mother and sisters who had diabetes  and vascular disease at a young age.   SOCIAL HISTORY:  He is single.  He has three children.  He is retired.  He is a former smoker, quit in 2000.  He does not consume alcohol  regularly.   REVIEW OF SYSTEMS:  Full twelve point review of systems was performed  with the patient  today.  Please see intake referral form for details  regarding that.   PHYSICAL EXAM:  Vital signs:  Blood pressure is 165/72 in the left arm,  heart rate is 82 and regular, respirations 16.  HEENT:  Unremarkable.  Neck:  Has 2+ carotid pulses without bruit.  Chest:  Clear to  auscultation.  Cardiac:  Exam is regular rate and rhythm without murmur.  Abdomen:  Soft, nontender, nondistended, obese, no masses.  Extremities:  He has a right below knee amputation with an open wound on the medial  aspect which measures 9 x 3 cm.  There are some areas of granulation  tissue with small islands of fibrinous exudate.  There is no purulent  discharge.  There is no surrounding erythema.  Left foot is warm and  well-perfused.  He has a 2+ right femoral pulse.  He has a 1+ left  femoral pulse.  He has absent popliteal and pedal pulses in the left  leg.  He has absent popliteal pulse in the right leg.  There is some  limb discrepancy with the right leg being approximately 10% larger than  the left secondary to edema.  Neurological:  Exam shows symmetric upper  extremity and lower extremity motor strength with no focal weakness.  Musculoskeletal:  Exam shows no major deformities other than his right  below knee amputation.  Skin:  Has no open ulcers or rashes other than  the aforementioned wound in the right below knee amputation.   I reviewed his duplex exam dated 08/03/2009.  This showed a greater than  75% stenosis of the right mid superficial femoral artery.   I had a lengthy discussion with the patient today regarding the wound in  his right leg.  He currently does not wish to have any intervention due  to the fact that he thinks the wound is improving.  I did offer him that  we could repeat his arteriogram to see if the stent in his right leg had  narrowed down and possibly increased perfusion pressure to this wound to  improve wound healing.  He currently is not interested in this since he   thinks the wound is improving.  I also discussed with him the  possibility of doing an above knee amputation to rid him of this chronic  wound on the right leg and he was also not interested in that at this  time.  Since he is currently not interested in any intervention I  believe the best option for him would be to have him return in six  months' time to reduplex his right superficial femoral artery and will  do an ABI on the left leg for followup at that time as well.  He will  continue to his local wound care as he is currently doing with  compression and local dressings.  He will continue to follow with Dr.  Lurene Shadow.     Philip Hora. Fields, MD  Electronically Signed   CEF/MEDQ  D:  09/01/2009  T:  09/02/2009  Job:  [id number]   cc:   Leonie Man, M.D.

## 2010-09-06 NOTE — Assessment & Plan Note (Signed)
[facility]   NAME:  Philip Strong, Philip Strong               ACCOUNT NO.:  [id number]   MEDICAL RECORD NO.:  [id number]      DATE OF BIRTH:  22-Jan-1941   PHYSICIAN:  Jonelle Sports. Sevier, M.D.  VISIT DATE:  11/04/2008                                   OFFICE VISIT   HISTORY:  This 70 year old black male with a BK stump ulcer on the right  lower extremity, is problematic because of known inflow blockage in that  extremity more proximally.  Because his wound is progressed despite  this, we have not moved to vascular surgical re-consultation on the one  hand or to use of HBO therapy on the other hand.  Most recently at home,  he has been cleansing the wound daily and placing Silvadene on it,  covered then with a bulky dressing.  He reports no fever, chills, odor,  drainage, or systemic symptoms.   PHYSICAL EXAMINATION:  Blood pressure 181/72, pulse 71, respirations 18,  and temperature 97.9.  The wound itself today appears essentially  unchanged, measures perhaps slightly larger in its width at 13.1 x 4.1  cm and 0.2 cm in depth.  Approximately, 90% of the base of the wound is  covered with a gelatinous very nonadherent slough.  There is no  surrounding erythema and no significant odor or drainage.   IMPRESSION:  Stable, but not progressing wound.   DISPOSITION:  The plan today will be to change the patient back to wet-  to-dry dressings of which he seems to handle more satisfactory than his  Silvadene applications.  This is done after the wound is debrided using  a curette of all the slough adhered before described.  He tolerates this  without any anesthesia.  He is treated today with a wet-to-dry dressing,  covered it with a bulky over dressing, and is instructed to change this  daily at home, cleansing the wound with warm Dial soap water, rinsing it  well, patting it dry, then reapplying wet-to-dry dressing as we have  done today.   Followup visit will be by the nurse in 1  week and by the physician in 2.           ______________________________  Jonelle Sports. Cheryll Cockayne, M.D.     RES/MEDQ  D:  11/04/2008  T:  11/04/2008  Job:  [id number]

## 2010-09-06 NOTE — Assessment & Plan Note (Signed)
[facility]   NAME:  Philip Strong, Philip Strong               ACCOUNT NO.:  [id number]   MEDICAL RECORD NO.:  [id number]      DATE OF BIRTH:  04/08/1941   PHYSICIAN:  Jonelle Sports. Sevier, M.D.  VISIT DATE:  08/12/2008                                   OFFICE VISIT   HISTORY:  This 70 year old black male was referred by Dr. Lajoyce Corners for  assistance with management of a dehisced BK amputation wound of the  right lower extremity.   The patient has a fairly complex medical history and is a very poor  historian when it comes to details.  He apparently has had hypertension  and diabetes for a number of years and a recent random glucose of 595  with hemoglobin A1c of 9.2 would suggest that his diabetes control has  been less than optimal.   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  That original wound  dehisced and in February this year was taken back to the operating room  and the wound was debrided and attempts were made again to close it or  at least to approximated and he was placed in a wound VAC.  That was  continued for a period of time, but the exact duration is uncertain.  Whatever, the wound never healed and has continued to show evidence of  some necrotic tissue and not to close.  His most recent debridement was  that done in the hospital in February with some subsequent office  debridements by Dr. Lajoyce Corners.   He arrives today for our evaluation and advice.   PAST MEDICAL HISTORY:  Significant for hypertension and diabetes as  indicated above and also he has had coronary artery bypass grafting.  Although the details are not clear by any means, apparently, there were  some attempts to diurese him because of extreme edema in the stump and  in his contralateral  leg but that resulted in some deterioration of  renal function and so was discontinued.  He was anemic on blood test  done in February of this year at the time his hospitalization and it is  not clear whether that has ever been addressed.   He denies any awareness of any chronic renal disease or chronic hepatic  disease and says that the edema on an ongoing basis has not been a  significant problem as best he can recall.   His regular medications at the moment include:  1. Glimepiride 2 mg b.i.d.  2. Lovastatin 40 mg daily.  3. Lisinopril 40 mg daily.  4. Lantus 60 units b.i.d.  5. NovoLog 32 units daily in the a.m.  6. Amlodipine 10 mg daily.  7. Klor-Con 20 mEq daily when he takes Lasix.  8. Lasix 20 mg daily, but apparently that had been stopped because of      the concern for his renal deterioration.  9. He has recently been on doxycycline 100 mg b.i.d. (no culture      results available or found in computer).  10.Oxycodone/APAP 5/500 b.i.d. as  needed.   He has no known medicinal allergies.   PHYSICAL EXAMINATION:  VITAL SIGNS:  Blood pressure is 176/71, pulse is  69, respirations 20, and temperature 97.7.  GENERAL:  This is a huge black male appearing younger than his stated  age who has significant edema of the entirety of the lower extremities  of both the problematic right extremity and the left extremity.  EXTREMITIES:  Indeed, the edema in his left calf is so tense that it  feels almost like wound.  Distal pulses not palpable in that extremity  through the edema.  The right lower extremity ends in a BK transtibial amputation which is  dehisced particularly with the suture line being significantly dehisced  in its medial portion and a little bit so at the lateral end of the  wound.  This major area of dehiscence measures 26 x 6 x 0.3 cm and is  covered with malodorous seminecrotic slough and other debris.  Some  suture material is seen no protruding from the wound.    I have listened today to his heart and he has a slow rhythm and I can  not hear a ventricular gallop.   DISPOSITION:  I initially attempted to begin some limited debridement of  the slough from his wound base, but he is extremely skittish and fearful  and would not allow this even those things that I think would not have  been associated with any significant pain whatsoever.  It thus becomes  clear that this wound cannot be debrided without some regional or more  extensive anesthesia.   On the other hand too it seems unlikely that this wound will ever heel  as long as he has the rather severe edema that he presently has.  He  admittedly has been unfaithful in wearing a recommended stump shrinker,  but I do not think that alone would be an adequate answer.   In view of the above and his unstable medical condition with unexplained  and untreated of anemia, history of renal deterioration with diuretic  use and his poorly controlled diabetes, I have called his primary  doctor, Dr. Donette Larry today and discussed with him the need for  hospitalization for medical tuning up and stabilization which should  include an echocardiogram to give [place] an ejection fraction and should  include a chest film.  Both those things would be necessary information  before we could undertake possibly hyperbaric oxygen.  Once he is  medically stable in the hospital (and Dr. Donette Larry agrees with the need  for this) and his fluid overload has been controlled, the wound will be  appropriately debrided by Dr. Lajoyce Corners again and a wound VAC immediately  applied.  He will then be returned for followup in this clinic and we  will hope that everything will allow [place] to offer him hyperbaric oxygen  therapy which is justified in the face of a failed flap if you will, the  consequences of it, its unhealing being even higher likely AK amputation  on that extremity.   Accordingly at Dr. Venita Sheffield recommendations, the patient is sent  from  here directly to the [facility] ER where he will be seen and evaluated  by an [facility] hospitalist, admitted, and Dr. Donette Larry will follow through  with recommendations as above.           ______________________________  Jonelle Sports. Cheryll Cockayne, M.D.     RES/MEDQ  D:  08/12/2008  T:  08/13/2008  Job:  [id number]   cc:   Nadara Mustard, MD  Georgann Housekeeper, MD

## 2010-09-06 NOTE — Op Note (Signed)
NAME:  Philip Strong, Philip Strong               ACCOUNT NO.:  [id number]   MEDICAL RECORD NO.:  [id number]          PATIENT TYPE:  AMB   LOCATION:  SDS                          FACILITY:  [facility]   PHYSICIAN:  Nadara Mustard, MD     DATE OF BIRTH:  1940-09-07   DATE OF PROCEDURE:  09/13/2006  DATE OF DISCHARGE:                               OPERATIVE REPORT   PREOPERATIVE DIAGNOSIS:  Nonhealing wound, right transtibial amputation.   POSTOPERATIVE DIAGNOSIS:  Nonhealing wound, right transtibial  amputation.   PROCEDURE:  1. Irrigation and debridement of skin and soft tissue, preparation of      wound bed.  2. Application of Apligraf, 2 units to 2 wounds, right transfibula      amputation.   SURGEON:  Dr. Lajoyce Corners   ANESTHESIA:  None.   DRAINS:  None.   COMPLICATIONS:  None.   DISPOSITION:  To home in stable condition.   INDICATION FOR PROCEDURE:  The patient is a 70 year old gentleman who is  status post a right transfibula amputation.  After failing all  conservative care, the wounds have progressed to the point where they  have not healed any further, and the patient presents at this time for  application of Apligraf.  Risks and benefits were discussed.  The  patient states he understands and wishes to proceed with application of  Apligraf at this time.   DESCRIPTION OF PROCEDURE:  The patient was brought to the short-stay,  and the right leg was cleansed with sterile saline, and the skin and  soft tissue was debrided back to bleeding, viable granulation tissue.  After preparation of the wound bed, the Apligraf was meshed x2 units.  Each unit was applied to each wound.  The wounds were then covered with  Mepitel Bactroban cream, 4x4's, Kerlix, and a Coban dressing.  The  patient was discharged to home in stable condition.  Follow up in the  office in 1 week to change the dressing.      Nadara Mustard, MD  Electronically Signed     MVD/MEDQ  D:  09/13/2006  T:  09/13/2006  Job:   [id number]

## 2010-09-06 NOTE — Assessment & Plan Note (Signed)
[facility]   NAME:  Philip Strong, Philip Strong               ACCOUNT NO.:  [id number]   MEDICAL RECORD NO.:  [id number]      DATE OF BIRTH:  27-Apr-1940   PHYSICIAN:  Joanne Gavel, M.D.         VISIT DATE:  09/09/2008                                   OFFICE VISIT   HISTORY OF PRESENT ILLNESS:  This is a 70 year old male status post  right BK amputation.  This wound has been nonhealing for several months.  He was seen on Sep 02, 2008 and the wound was treated with Santyl moist  gauze wrapped with Coban.  He is to be seen daily by home health care.  Today,  the wound is essentially unchanged.  There is some slough at the  base which was debrided using a curette and 5% Xylocaine ointment.  He  tolerated this well.  Hemostasis was obtained with pressure.  The  bleeding was minimal.   PLAN:  Continue Santyl and wet-to-dry dressings.  See [place] in 1 week.      Joanne Gavel, M.D.  Electronically Signed     RA/MEDQ  D:  09/09/2008  T:  09/09/2008  Job:  [id number]

## 2010-09-06 NOTE — Assessment & Plan Note (Signed)
[facility]   NAME:  Philip Strong, Philip Strong               ACCOUNT NO.:  [id number]   MEDICAL RECORD NO.:  [id number]      DATE OF BIRTH:  [birth year]   PHYSICIAN:  Jonelle Sports. Sevier, M.D.  VISIT DATE:  09/30/2008                                   OFFICE VISIT   This 70 year old black male who was followed for wounds of a right BKA  stump, small wound laterally and a much larger wound on the medial  aspect.  These have made gradual progress with appropriate treatment.   He reports considerable pain in the area since showering this morning,  but otherwise has had no particular symptoms.  He has changed his own  dressings at home, which may or may not be advisable, but has had no  awareness of heavy drainage, odor, bleeding, or systemic symptoms.   PHYSICAL EXAMINATION:  Blood pressure is 150/69, pulse 64, respirations  19, temperature 98.3.  The medial wound measures 0.5 x 1.5 x 0.1 cm and  is partially epithelialized, really essentially healed.   The lateral wound is 4.0 x 12.5 x 0.3 cm which represents a slight  contraction from previously.  The base of this wound is covered with  slightly adherent slough.   IMPRESSION:  Continued improvement wounds of right below-knee amputation  stump.   DISPOSITION:  The larger wound is debrided using a curette under 5%  topical lidocaine protection and gets down to a good clean granular  base.  The wound will be dressed today with an application of hydrogel  and Promogran covered by dry gauze, Kerlix, and Coban and he was  instructed not to change the dressing at home at all.  The smaller  lateral wound was treated with an application of Neosporin underneath  the same general dressing.  He was advised to elevate the stump as much  as possible.   He was given Darvocet-N 100 one every 4 hours as needed for pain #30.           ______________________________  Jonelle Sports. Cheryll Cockayne, M.D.     RES/MEDQ  D:  09/30/2008  T:  10/01/2008   Job:  [id number]

## 2010-09-09 NOTE — Discharge Summary (Signed)
NAME:  EBEN, CHOINSKI NO.:  [id number]   MEDICAL RECORD NO.:  [id number]          PATIENT TYPE:  INP   LOCATION:  5731                         FACILITY:  [facility]   PHYSICIAN:  Nadara Mustard, MD     DATE OF BIRTH:  21-Apr-1941   DATE OF ADMISSION:  09/13/2005  DATE OF DISCHARGE:  09/16/2005                                 DISCHARGE SUMMARY   DIAGNOSES:  1.  Peripheral vascular disease with blockage of the superficial femoral      artery on the right.  2.  Ischemic gangrenous right fourth toe.   PROCEDURES:  1.  Arteriogram a right lower extremity knee.  2.  Placement of a stent by Dr. Arbie Cookey.  3.  Right fourth ray amputation by Dr. Lajoyce Corners.   Discharged to home in stable condition.   INDICATIONS FOR PROCEDURE:  The patient is a 70 year old gentleman with  ischemic gangrenous right fourth toe.  The patient was seen for evaluation  and initial admission by vascular surgery for his ischemic changes to the  right foot.   The patient's hospital course essentially unremarkable.  He underwent  arterial studies with CVTS.  The patient had a small complete occlusive  lesion of the superficial femoral artery.  A stent graft was placed to  completely open the arterial flow.  The patient had progressive ischemic  changes with pain to the right fourth toe and orthopedic was consulted for  evaluation and treatment of the ischemic gangrenous changes to the right  fourth toe.  Discussed wound care treatment versus fourth ray amputation and  the patient wished to proceed with a fourth ray amputation.  The patient  underwent a right foot fourth ray amputation on Sep 15, 2005,  anesthesia  with an ankle block, a tourniquet was not used.  He received Kefzol for  infection prophylaxis.  The patient progressed well after therapy and was  able to ambulate without weightbearing on the foot and he was discharged to  home in stable condition on Sep 16, 2005, with follow-up with Dr. Lajoyce Corners in  2  weeks.      Nadara Mustard, MD  Electronically Signed     MVD/MEDQ  D:  10/12/2005  T:  10/12/2005  Job:  [id number]

## 2010-09-09 NOTE — Op Note (Signed)
NAME:  DJIMON, LUNDSTROM NO.:  [id number]   MEDICAL RECORD NO.:  [id number]          PATIENT TYPE:  INP   LOCATION:  5001                         FACILITY:  [facility]   PHYSICIAN:  Nadara Mustard, MD     DATE OF BIRTH:  10-06-1940   DATE OF PROCEDURE:  10/20/2005  DATE OF DISCHARGE:                                 OPERATIVE REPORT   PREOPERATIVE DIAGNOSIS:  Gangrene, right foot, status post revascularization  to the right lower extremity.   POSTOPERATIVE DIAGNOSIS:  Gangrene, right foot, status post  revascularization to the right lower extremity.   PROCEDURE:  Right below-the-knee amputation.   SURGEON:  Nadara Mustard, MD   ANESTHESIA:  General.   ESTIMATED BLOOD LOSS:  Minimal.   ANTIBIOTICS:  Vancomycin 1 g.   DRAINS:  None.   COMPLICATIONS:  None.   TOURNIQUET TIME:  Ten minutes at 300 mmHg at the thigh.   SPECIMENS:  Sent to Pathology.   DISPOSITION:  To PACU in stable condition.   INDICATION FOR PROCEDURE:  The patient is a 70 year old gentleman with  ischemic necrotic changes to the forefoot on the right.  He is status post a  fourth ray amputation and revascularizations to the right lower extremity.  Despite revascularization, the patient has had progressive ischemic,  necrosis and gangrenous changes to the forefoot with black gangrene to the  fifth toe as well as the third toe at this time.  Due to the patient's  progressive gangrenous changes, surgical interventions were discussed  including a midfoot amputation versus below-the-knee amputation.  The  patient states he would like to proceed with a below-the-knee amputation.  Risks and benefits were discussed including infection, neurovascular injury,  persistent pain, and need for additional surgery.  The patient states he  understands and wish to proceed at this time.   DESCRIPTION OF PROCEDURE:  The patient was brought to OR room 15 and  underwent a general anesthetic.  After adequate  level of anesthesia was  obtained, the patient's right lower extremity was prepped using DuraPrep,  draped into a sterile field and the right foot was draped into an impervious  stockinette.  After sterile prepping, the patient received of 1 g of  vancomycin IV.  The leg was elevated and tourniquet inflated to 300 mmHg.  A  transverse incision was made 10 cm distal to the tibial tubercle.  This  curved proximally and a large posterior flap was created.  The tibia was  transected 1 cm proximal to the skin incision with a bevel anteriorly and  the fibula was transected 1 cm proximal to the tibia.  The amputation knife  was used to create a large posterior flap.  The sciatic nerve was pulled,  cut and allowed to retract.  The vascular bundles were suture-ligated x3  each with 2-0 silk.  The tourniquet was deflated after 10 minutes and  hemostasis was obtained.  The deep and superficial fascial layers were  closed using #2 PDS.  The skin was closed using approximated staples and 2-0  Vicryl using a far-near/near-far suture.  The wound edges closed well.  There was a significant amount of swelling which required the nylon sutures.  The wound was covered with Adaptic, orthopedic sponges, sterile Webril and a  Coban dressing.  The patient was extubated and taken to PACU in stable  condition.      Nadara Mustard, MD  Electronically Signed     MVD/MEDQ  D:  10/20/2005  T:  10/21/2005  Job:  [id number]

## 2010-09-09 NOTE — Discharge Summary (Signed)
NAME:  Philip Strong, Philip Strong NO.:  [id number]   MEDICAL RECORD NO.:  [id number]          PATIENT TYPE:  INP   LOCATION:  5014                         FACILITY:  [facility]   PHYSICIAN:  Nadara Mustard, MD     DATE OF BIRTH:  05/30/1940   DATE OF ADMISSION:  10/03/2005  DATE OF DISCHARGE:  10/09/2005                                 DISCHARGE SUMMARY   DIAGNOSIS:  Methicillin resistant Staphylococcus aureus infection with wound  dehiscence status post fourth ray amputation, right foot.   PROCEDURE:  Irrigation and debridement right foot wound with application of  Wound VAC.   DISPOSITION:  Discharged to home in stable condition with prescriptions for  Vicodin and doxycycline. Follow-up in the office in two weeks.   HISTORY OF PRESENT ILLNESS:  The patient is a 70 year old gentleman who is  status post a fourth ray amputation secondary to ulceration infection with a  history of diabetes, hypertension, and peripheral vascular disease.   HOSPITAL COURSE:  The patient underwent surgical intervention on October 03, 2005, for debridement of necrotic right foot wound status post fourth ray  amputation.  The wound was debrided back to viable bleeding tissue and a  Wound VAC was applied.  The patient was started on IV vancomycin.  Cultures  returned positive for gram positive rods and gram positive cocci.  Culture  and sensitivity were obtained.  The patient was discharged with home Wound  VAC therapy. The patient's cultures returned and cultures were sensitive to  the doxycycline.  The patient was discharged to home on doxycycline with  home health nursing for Wound VAC changes Monday, Wednesday, Friday, with  follow-up in the office in two weeks.      Nadara Mustard, MD  Electronically Signed     MVD/MEDQ  D:  11/04/2005  T:  11/04/2005  Job:  [phone] [id number]

## 2010-09-09 NOTE — Discharge Summary (Signed)
NAME:  Philip Strong, Philip Strong               ACCOUNT NO.:  [id number]   MEDICAL RECORD NO.:  [id number]          PATIENT TYPE:  INP   LOCATION:  5001                         FACILITY:  [facility]   PHYSICIAN:  Nadara Mustard, MD     DATE OF BIRTH:  Oct 18, 1968   DATE OF ADMISSION:  10/20/2005  DATE OF DISCHARGE:  10/25/2005                               DISCHARGE SUMMARY   FINAL DIAGNOSIS:  Gangrene right foot, status post revascularization.   PROCEDURE:  Right below-the-knee amputation.   Discharged to home in stable condition with home health physical therapy  and follow-up in the office in 2 weeks.   HISTORY OF PRESENT ILLNESS:  The patient is a 70 year old gentleman with  type 2 diabetes and ischemic changes and gangrenous changes of the right  foot.  He is status post revascularization and status post wound  debridement and after failure of conservative care for foot salvage, the  patient presents at this time for transtibial amputation.  The patient  underwent a transtibial amputation of the right leg on October 20, 2005.  He received vancomycin for infection prophylaxis.  Tourniquet time was  10 minutes at 300 mmHg.  Postoperatively the patient progressed well.  Rehab was consulted and he was placed on Lyrica for neuropathic pain.  The patient progressed well with physical therapy and was discharged to  rehab in stable condition on October 24, 2005, on postoperative day #4 with  plans for discharge for follow-up in the office 2 weeks after discharge  from rehab.      Nadara Mustard, MD  Electronically Signed     MVD/MEDQ  D:  04/12/2006  T:  04/12/2006  Job:  [id number]

## 2010-09-09 NOTE — Op Note (Signed)
NAME:  Philip Strong, Philip Strong               ACCOUNT NO.:  [id number]   MEDICAL RECORD NO.:  [id number]          PATIENT TYPE:  AMB   LOCATION:  SDS                          FACILITY:  [facility]   PHYSICIAN:  Nadara Mustard, MD     DATE OF BIRTH:  [birth year]   DATE OF PROCEDURE:  09/16/2006  DATE OF DISCHARGE:  09/13/2006                               OPERATIVE REPORT   PREOPERATIVE DIAGNOSIS:  Gangrene right fourth toe.   POSTOPERATIVE DIAGNOSIS:  Gangrene right fourth toe.   PROCEDURE:  Right fourth ray amputation right foot.   SURGEON:  Nadara Mustard, MD.   ANESTHESIA:  Ankle block plus general.   ESTIMATED BLOOD LOSS:  Minimal.   ANTIBIOTICS:  1 gram of Kefzol.   TOURNIQUET TIME:  None.   DISPOSITION:  To PACU in stable condition.   INDICATIONS FOR PROCEDURE:  The patient is a 70 year old gentleman with  gangrene of the right fourth toe.  The patient presents at this time for  surgical intervention due to failure of conservative care with gangrene  of the fourth toe.  Risks and benefits were discussed including  infection, neurovascular injury, nonhealing of the wound, need for  additional surgery.  The patient states he understands and wished  proceed at this time.   DESCRIPTION OF PROCEDURE:  The patient was brought to the OR and  underwent an ankle block plus general anesthetic.  After adequate level  of anesthesia obtained, the patient's right lower extremities prepped  using DuraPrep and draped into a sterile field.  The fourth ray was  amputated with a dorsal incision only, there was no plantar incision.  The ray was resected in one block of tissue.  There was no purulence, no  abscess deep in the wound.  The wound was irrigated with normal saline.  Hemostasis was obtained with electrocautery.  The incision was closed  using a modified vertical mattress suture with 2-0 nylon.  The wound was  covered with Adaptic orthopedic sponges, Webril and Coban.  The patient  was  extubated and taken to PACU in stable condition.      Nadara Mustard, MD  Electronically Signed     MVD/MEDQ  D:  12/12/2006  T:  12/13/2006  Job:  [id number]

## 2010-09-09 NOTE — Discharge Summary (Signed)
[place]  Patient:    Philip Strong, Philip Strong Visit Number: [id number] MRN: [id number]          Service Type: MED Location: 5000 [id number] [id number] Attending Physician:  Lillia Mountain Dictated by:   Georgann Housekeeper, M.D. Admit Date:  10/28/2001 Discharge Date: 11/01/2001                             Discharge Summary  DATE OF BIRTH:  03-04-1941  DISCHARGE DIAGNOSIS:  Cellulitis of the right foot with retained fourth and fifth toe with abscess, rule out osteomyelitis.  MEDICATIONS ON DISCHARGE:  1. Augmentin 875 b.i.d. for two weeks.  2. Lamisil 250 mg once a day for two weeks.  3. Lantus 30 units q.d.  4. Actos 45 mg once a day.  5. Aspirin 325 mg q.d.  6. Lipitor 40 mg q.d.  7. Prilosec 30 mg q.d.  8. Iron 325 once a day.  9. Percocet 1 q.6-8h p.r.n. 10. Glucophage 500 mg, two tablets b.i.d. 11. Lotensin 20 mg b.i.d.  CONDITION ON DISCHARGE:  Stable.  WOUND CARE:  Soak in Dial soap with room temperature water for 20 minutes twice a day with applying wool dry between the toes.  Off the feet for at least one week.  FOLLOWUP:  Followup with me at the office in one week and follow up with orthopedics, Dr. Lajoyce Corners, in one week.  HOSPITAL COURSE:  The patient is 70 years old with type 2 diabetes, fairly well-controlled, admitted with infection of a fungal bacteria between the fourth and fifth space of the toe with cellulitis, maceration ulcer. Initially, he had a normal white count, was started on Zosyn, and orthopedics was consulted.  X-ray was negative with no evidence of any osteomyelitis. Because of the location, it was thought to be also a fungal element since he had some fungal nail infections.  He was started on Lamisil along with the Zosyn.  The patient remained afebrile and the wound was superficially drained and the cultures grew out gram positive cocci with a few white cells.  The patient was switched to p.o. Augmentin and continued on  Lamisil and as far as his diabetes, it remained fairly well-controlled at the end of the hospitalization with a blood sugar between the 108-180 range.  As far as his anemia, he had a history of longstanding chronic anemia which had been worked up in the past.  Iron studies were unremarkable and pain control was obtained with Percocet. Dictated by:   Georgann Housekeeper, M.D. Attending Physician:  Lillia Mountain DD:  11/01/01 TD:  11/04/01 Job: [id number]

## 2010-09-09 NOTE — Discharge Summary (Signed)
NAME:  Philip Strong, Philip Strong               ACCOUNT NO.:  [id number]   MEDICAL RECORD NO.:  [id number]          PATIENT TYPE:  INP   LOCATION:  5008                         FACILITY:  [facility]   PHYSICIAN:  Nadara Mustard, MD     DATE OF BIRTH:  07/20/1940   DATE OF ADMISSION:  06/03/2008  DATE OF DISCHARGE:  06/06/2008                               DISCHARGE SUMMARY   FINAL DIAGNOSIS:  Dehiscence, right transtibial amputation.   PROCEDURE:  Right revision and right transtibial amputation.   DISPOSITION:  Discharged to home in stable condition.   INDICATIONS FOR PROCEDURE:  The patient is a 70 year old gentleman with  dehiscence of a transtibial amputation.  He has had chronic ulceration  and failure of healing and presents at this time for revision of the  amputation after failure of conservative care.   HOSPITAL COURSE:  The patient's hospital course was essentially  unremarkable.  He underwent revision of the transtibial amputation on  June 03, 2008.  He received vancomycin for infection prophylaxis and  a wound VAC was placed to improve the healing.  A tourniquet was not  used during the case.  Postoperatively, the patient progressed well.  There was a very small amount of drainage from the wound VAC.  Internal  Medicine was consulted for better control of his diabetes.  The patient  was discharged to home in stable condition on June 06, 2008 with  followup in the office in 2 weeks.      Nadara Mustard, MD  Electronically Signed     Nadara Mustard, MD  Electronically Signed    MVD/MEDQ  D:  07/16/2008  T:  07/17/2008  Job:  [phone] [id number]

## 2010-09-09 NOTE — Op Note (Signed)
NAME:  BENTZION, DAURIA NO.:  [id number]   MEDICAL RECORD NO.:  [id number]            PATIENT TYPE:   LOCATION:                                 FACILITY:   PHYSICIAN:  Nadara Mustard, MD          DATE OF BIRTH:   DATE OF PROCEDURE:  10/03/2005  DATE OF DISCHARGE:                                 OPERATIVE REPORT   PREOP DIAGNOSIS:  Necrotic right foot wound status post right fourth ray  amputation and revascularization to the right lower extremity.   POSTOP DIAGNOSIS:  Necrotic right foot wound status post right fourth ray  amputation and revascularization to the right lower extremity.   PROCEDURE:  1.  Irrigation and debridement of skin, soft tissue, and bone.  2.  Application wound V.A.C.   SURGEON:  Nadara Mustard, MD   ANESTHESIA:  General.   ESTIMATED BLOOD LOSS:  Minimal.   ANTIBIOTICS:  Vancomycin 1 gram.   DRAINS:  None.   COMPLICATIONS:  None.   TOURNIQUET TIME:  None.   DISPOSITION:  To PACU in stable condition.   INDICATIONS FOR PROCEDURE:  The patient is a 70 year old gentleman with type  2 diabetes.  The patient previously had ischemic necrotic right fourth toe.  He underwent revascularization with a stent placed by vascular surgery.  He  had improvement of his flow; however, due to the ischemic necrosis and  increased pain; the patient underwent a fourth ray amputation.  On follow up  the patient's wound has dehisced with breaking down and marginal necrosis of  the skin; and presents at this time for revision, irrigation, and  debridement.   DESCRIPTION OF PROCEDURE:  The patient was brought to OR room #4 and  underwent a general anesthetic.  Risks and benefits were discussed with the  patient preoperatively including infection, neurovascular injury, nonhealing  of the wound, need for a below-the-knee amputation.  The patient states that  he understands and wishes to proceed at this time.  The right lower  extremity was prepped using  DuraPrep and draped into a sterile field.  Approximately 1/4-inch around the wound edges was ellipsed out; and the  proximal aspect of the bone was also rongeured back.   After debridement, there was good bleeding petechial tissue.  Cultures were  obtained prior to debridement with deep wound cultures obtained.  The wound  was irrigated with pulse lavage.  There was good bleeding petechial tissue;  and a wound vac was placed deep in the wound to improve the granulation  tissue; and to maximize the patient's foot salvage.  The wound V.A.C. was  set for minus 75 mmHg continuous.  The patient was extubated, and taken to  PACU in stable condition.   PLAN:  For admission, IV vancomycin, discharge once cultures are finalized,  and once home, IV wound V.A.C. is established.      Nadara Mustard, MD  Electronically Signed     MVD/MEDQ  D:  10/03/2005  T:  10/03/2005  Job:  [id number]

## 2010-09-09 NOTE — Op Note (Signed)
NAME:  Philip Strong, Philip Strong NO.:  [id number]   MEDICAL RECORD NO.:  [id number]          PATIENT TYPE:  INP   LOCATION:  5731                         FACILITY:  [facility]   PHYSICIAN:  Larina Earthly, M.D.    DATE OF BIRTH:  February 02, 1941   DATE OF PROCEDURE:  09/14/2005  DATE OF DISCHARGE:                                 OPERATIVE REPORT   PREOPERATIVE DIAGNOSIS:  Right foot ischemia with right fourth toe gangrene  and superficial femoral artery occlusion.   POSTOPERATIVE DIAGNOSIS:  Right foot ischemia with right fourth toe gangrene  and superficial femoral artery occlusion.   PROCEDURE:  Angioplasty and Smart stent stenting of the occluded right  superficial femoral artery.   SURGEON:  Larina Earthly, M.D.   ANESTHESIA:  1% lidocaine local.   COMPLICATIONS:  None.   DISPOSITION:  To the holding area stable.   INDICATIONS FOR PROCEDURE:  The patient is a 70 year old gentleman with  gangrene of his fourth toe on the right.  He had had a diagnostic  arteriogram the day prior to this procedure.  This revealed an occlusion of  his superficial femoral artery over a short segment of the adductor canal.  The patient had had vein harvested from the right leg for prior coronary  artery bypass grafting.  He had a decision to return for antegrade stick in  his common femoral artery with hopes of crossing and stenting his short-  segment superficial femoral artery occlusion.   PROCEDURE IN DETAIL:  The patient was taken to the peripheral vascular  catheterization lab and placed in supine position, where the area of the  right groin was prepped and draped in a sterile fashion.  An antegrade stick  was used to access the common femoral artery on the right and a Wholey wire  was passed through the superficial femoral artery down to the level of the  occlusion.  A 5-French sheath was passed over this and a hand injection was  used to demonstrate the level of the occlusion.  An  end-hole catheter was  positioned down to the level of the occlusion.  This was probed with a  Wholey wire and the Wholey wire would not cross.  A Glidewire was then used  to probe this area as well.  Finally the Glidewire was prolapsed and was  able to cross the occlusion and reenter into the area of the widely patent  distal superficial femoral artery and proximal popliteal artery.  A  guidewire was used to cross this and injection through the sheath revealed  that the guidewire was indeed in the true lumen.  A 5 mm x 8 cm balloon was  then used to angioplasty this area.  Repeat angiogram and hand injection  through the sheath showed excellent flow through this.  There was continued  stenosis; therefore, this was upsized to a 6 mm x 6 cm balloon.  Hand  injection following this showed better flow.  There was mobile intimal  debris below the level of the angioplasty.  Due to the continued resistance  of dilatation in  the central portion of the prior total occlusion, a  decision was made to cover this area with a Smart stent.  An 8 mm x 6 cm  balloon was chosen.  This was positioned at the level of the prior  occlusion.  This was deployed and repeat injection showed excellent  positioning.  There continued to be some residual stenosis, and therefore a  7 mm balloon of 4 cm length was used to inflate at this  specific area.  Final hand injection showed no evidence of residual stenosis  with excellent angiographic appearance.  The patient tolerated the procedure  without immediate complication and was transferred to the holding area in  stable condition.      Larina Earthly, M.D.  Electronically Signed     TFE/MEDQ  D:  09/15/2005  T:  09/15/2005  Job:  [id number]

## 2010-09-09 NOTE — H&P (Signed)
[place]  Patient:    Philip Strong, Philip Strong Visit Number: [id number] MRN: [id number]          Service Type: MED Location: 5000 [id number] [id number] Attending Physician:  Lillia Mountain Dictated by:   Mary A. Placey, FNP Admit Date:  10/28/2001                           History and Physical  CHIEF COMPLAINT:  Pain, redness, and swelling in the fourth and fifth toes of the right foot.  HISTORY OF PRESENT ILLNESS:  Seen Friday in the emergency room for probable cellulitis; 750 mg of Keflex q.6h. since.  No improvement.  No fever or chills.  He is a diabetic.  His blood sugars are stable.  He has had minor symptoms five weeks ago that went away.  X-ray during this evaluation suggests early osteomyelitis.  Decision was to admit.  PAST MEDICAL HISTORY:  DRUG ALLERGIES:  No known drug allergies.  CURRENT MEDICINES: 1. Lantus 30 units at night. 2. Actos 45 mg q.d. 3. Glucophage 500 mg 2 b.i.d. 4. Lipitor 40 mg q.d. 5. Lotensin 20 mg b.i.d. 6. Prevacid 30 mg 1 q.d. 7. Aspirin 325 mg 1 q.d. 8. Viagra p.r.n.  MEDICAL: 1. Type 2 diabetes 10 years ago. 2. CAD with bypass surgery x 5 in 2000, Clarence H. Cornelius Moras, M.D. 3. Hiatal hernia with peptic ulcer disease, Ulyess Mort, M.D.    endoscoped. 4. Guaiac positive stools. 5. Iron deficiency anemia in 1996 with colonoscopy in 2000, showing polyps. 6. Hemorrhoids. 7. Erectile dysfunction. 8. Tenia pedis. 9. Tobacco abuse, quit in 2001.  SURGICAL: 1. Circumcision by Dr. Earlene Plater. 2. Abscess to left inner thigh and left axilla. 3. Coronary artery bypass by Salvatore Decent. Cornelius Moras, M.D., March 2000.  FAMILY HISTORY:  Mother died at age 67 with diabetes, peripheral vascular disease, amputation of the leg.  Father died at age 17 of hypertension. Brother 13 has gout and hypertension.  Sister diabetic with peripheral vascular disease, just got an amputation.  One sister died of diabetes and complications of  neuropathy and peripheral vascular disease.  SMOKING HISTORY:  About 27 years.  Quit two years ago.  Occasional alcohol. Smoked marijuana in the past but does not any longer.  SOCIAL HISTORY:  Married to his second wife.  Four children. 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  REVIEW OF SYSTEMS:  Unremarkable except for chief complaint.  PHYSICAL EXAMINATION:  GENERAL APPEARANCE:  Well-appearing male in moderate distress.  VITAL SIGNS:  WT 265, T 97.0, P 72, BP 146/78.  HEENT:  Eyes PERRLA.  Ears:  EAC/TMs clear.  Oral cavity clear. Nose:  Unremarkable.  NECK:  Supple with no thyromegaly, lymphadenopathy, or nodules.  No carotid bruits.  CHEST:  Clear to auscultation.  HEART:  Regular rate and rhythm without murmurs, rubs, gallops, or click.  ABDOMEN:  Soft, nontender with normal bowel sounds with no bruit, masses, or hepatosplenomegaly.  URINARY TRACT:  Deferred.  RECTAL:  Deferred.  EXTREMITIES:  No edema.  Peripheral pulses intact.  Questionable abscess between fourth and fifth toe, right foot.  Tender, swelling, and redness, radiating up around the fourth and fifth toe.  Pain to palpation in both on the top of the foot as well as on the bottom of the foot.  X-ray suggests early osteomyelitis versus abscess.  NEUROLOGIC:  Cranial nerves 2-12 intact.  ASSESSMENT:  Questionable abscess fourth  and fifth toe versus osteomyelitis by x-ray.  PLAN:  Admit for further evaluation including evaluation by orthopedics. Dictated by:   Mary A. Hilbert Corrigan, FNP Attending Physician:  Lillia Mountain DD:  10/28/01 TD:  10/30/01 Job: [id number]

## 2010-09-09 NOTE — H&P (Signed)
NAME:  Philip Strong, Philip Strong NO.:  [id number]   MEDICAL RECORD NO.:  [id number]          PATIENT TYPE:  INP   LOCATION:  1827                         FACILITY:  [facility]   PHYSICIAN:  Marlan Palau, M.D.  DATE OF BIRTH:  [birth year]   DATE OF ADMISSION:  02/18/2004  DATE OF DISCHARGE:                                HISTORY & PHYSICAL   HISTORY OF PRESENT ILLNESS:  Philip Strong is a 70 year old, right-handed,  black male, born February 09, 1941 with a history of hypertension, diabetes,  hypercholesterolemia.  The patient works at the [employer] system.  Had gotten off work this evening.  Returned home around 1 a.m., had half a  beer. The patient noted that the left arm and hand became somewhat weak,  possibly a bit numb.  The patient denied any problems with the left face or  the left leg.  The patient feels that the right eye was weak, possibly  having some double vision.  The patient called EMS, was brought to the  hospital.  Deficits cleared within two hours.  The patient again denied  headache.  CT scan of the head was done, was unremarkable.  The patient  claims he is back to baseline at this time.  The patient denies any other  prior history of similar events.   PAST MEDICAL HISTORY:  1.  History of diabetes.  2.  Transient left upper extremity weakness.  3.  Coronary artery disease.  4.  CABG procedure x5 in the past.  5.  History of peptic ulcer disease.  6.  History of anemia.  7.  History of erectile dysfunction.  8.  Hypertension.  9.  Hypercholesterolemia.  10. History of fracture of the clavicle, right ankle, right fifth finger in      the past.  11. History of left thigh abscess drainage.   MEDICATIONS:  1.  Metformin 1000 mg twice a day, 500 mg midday.  2.  Lotrel 5/10 mg tablet one a day.  3.  Zestoretic 10/25 mg daily.  4.  Avandia 4 mg daily.  5.  Lipitor 40 mg a day.  6.  Aspirin 81 mg a day.  7.  Lantus insulin 66 units subcutaneously  q.a.m.  The patient is on another      form of insulin, possibly Regular, 16 units twice a day.   ALLERGIES:  The patient has no known drug allergies.   HABITS:  Drinks alcohol on occasion.  Smokes on occasion.   SOCIAL HISTORY:  The patient is divorced.  Lives in the [place] area.  Has three children who are alive and well.  Again the patient works for the  [employer] system.   FAMILY HISTORY:  Notable that mother died with diabetes, peripheral vascular  disease.  Father died possibly with heart disease.  The patient had four  sisters, one brother, all passed away with diabetes, heart disease,  peripheral vascular disease.   REVIEW OF SYSTEMS:  Notable for no fevers, chills.  The patient denies  headache, denies problems swallowing, has had slight cough lately,  denies  chest pain.  Has had some nausea two days ago, none now.  The patient denies  any problem controlling the bowels or bladder.  Denies any __________  episodes, seizures, prior history of stroke.   PHYSICAL EXAMINATION:  VITAL SIGNS:  Blood pressure 161/85, heart rate 64,  respiratory rate 20, afebrile.  GENERAL:  The patient is a moderately obese, black male who is alert and  cooperative at the time of examination.  HEENT:  Head is atraumatic.  Pupils are equal, round and reactive to light.  Disks are flat bilaterally.  NECK:  Supple.  No carotid bruits.  No nodes.  RESPIRATORY:  Clear.  CARDIOVASCULAR:  Distant heart sounds.  No obvious murmurs or rubs noted.  ABDOMEN:  Quite obese, nontender.  No organomegaly is seen.  EXTREMITIES:  Without significant edema.  NEUROLOGIC:  Cranial nerves as above.  Facial symmetry is present.  The  patient has good sensation to pinprick and soft touch.  Has good strength of  the facial muscles, muscles of head turning and shoulder shrug bilaterally.  __________ .  Extraocular movements are full.  Visual fields are full.  Motor test reveals 5/5 strength in all fours.   Good symmetric motor tone is  noted throughout.  Sensory testing is intact to pinprick and soft touch.  Vibratory sensation throughout.  The patient has good finger-nose-finger and  toe-to-finger bilaterally.  The patient is not ambulated.  Deep tendon  reflexes are depressed but symmetric.  Toes are neutral bilaterally.  No  drift is seen.   LABORATORY DATA:  Pending at this time.  Chest x-ray and EKG are pending.  CT scan of the head is unremarkable.   IMPRESSION:  1.  Transient left upper extremity weakness, rule out transient ischemic      attack.  2.  Diabetes.  3.  Hypertension.  4.  Hypercholesterolemia.   The patient has multiple risk factors for stroke.  Will admit this patient  at this point for further cerebrovascular work-up.   PLAN:  1.  Admission to [facility].  2.  Admission blood work, still pending.  3.  MRI of the brain.  4.  MRI angiogram of the intracranial and extracranial vessels.  5.  2-D echocardiogram.  6.  Aspirin therapy.  7.  Fluid hydration.  8.  Will follow the patient's clinical course while in-house.       CKW/MEDQ  D:  02/18/2004  T:  02/18/2004  Job:  [id number]   cc:   Georgann Housekeeper, MD  [address]  [place]  [place] [address]  Fax: [phone]   [facility]  [address] - [address]

## 2010-09-09 NOTE — Op Note (Signed)
NAME:  Philip Strong, Philip Strong               ACCOUNT NO.:  [id number]   MEDICAL RECORD NO.:  [id number]          PATIENT TYPE:  INP   LOCATION:  NA                           FACILITY:  [facility]   PHYSICIAN:  Larina Earthly, M.D.    DATE OF BIRTH:  12-Feb-1941   DATE OF PROCEDURE:  09/15/2005  DATE OF DISCHARGE:                                 OPERATIVE REPORT   PREOPERATIVE DIAGNOSIS:  Right foot ischemia with gangrene of right fourth  toe.   POSTOPERATIVE DIAGNOSIS:  Right foot ischemia with gangrene of right fourth  toe.   PROCEDURES:  Aortogram with bilateral lower extremity runoff.   SURGEON:  Larina Earthly, M.D.   ANESTHESIA:  1% lidocaine local.   COMPLICATIONS:  None.   DISPOSITION:  To holding area stable.   DESCRIPTION OF PROCEDURE:  The patient was taken peripheral vascular  catheterization lab and placed in supine position where both right left  groins were prepped and draped in the usual sterile fashion. Using local  anesthesia and a single-wall puncture the right common femoral artery was  entered.  A guidewire was passed up the level suprarenal aorta.  A pigtail  catheter was positioned at level suprarenal aorta and AP projection was  undertaken.  This revealed patent single renal arteries bilaterally.  There  was very tight aortic bifurcation.  There was no evidence of iliac occlusive  disease.  Next a crossover catheter was used to cannulate the left common  iliac artery. Runoff was obtained via this crossover catheter. This revealed  patent external iliac artery and patent profunda and superficial femoral  artery.  There was some mild stenosis at the adductor canal on the left.  Popliteal artery was widely patent and there was three-vessel runoff with  posterior tibial and anterior tibial being the dominant runoff vessels.  There was washout of the distal tibia with poor visualization.  The patient  was asymptomatic in this side.  Further images were not obtainable the  left  to save contrast load.  Next the right leg runoff was obtained via the right  femoral sheath. This revealed patent proximal superficial femoral artery and  patent profunda femoris artery.  There was a total occlusion at the adductor  canal with collateral formation around this.  Popliteal artery was widely  patent with no evidence of stenosis.  There was three-vessel runoff with  moderate irregularity throughout the vessels but flow into the foot via the  posterior tibial and anterior tibial arteries.  The patient tolerated  procedure without any complication and was transferred to the holding area  in stable condition.   FINDINGS:  1.  Widely patent aortoiliac segment.  2.  Right superficial femoral artery occlusion at the adductor canal with      reconstitution of above knee popliteal artery and three-vessel runoff.  3.  Patent superficial femoral, popliteal, and tibial vessels on the left.      Larina Earthly, M.D.  Electronically Signed     TFE/MEDQ  D:  09/15/2005  T:  09/15/2005  Job:  [id number]

## 2010-10-08 ENCOUNTER — Emergency Department (HOSPITAL_COMMUNITY)
Admission: EM | Admit: 2010-10-08 | Discharge: 2010-10-08 | Disposition: A | Discharge status: Home / Self Care | Payer: Medicare Other | Attending: Emergency Medicine | Admitting: Emergency Medicine

## 2010-10-08 DIAGNOSIS — I10 Essential (primary) hypertension: Secondary | ICD-10-CM | POA: Insufficient documentation

## 2010-10-08 DIAGNOSIS — R109 Unspecified abdominal pain: Secondary | ICD-10-CM | POA: Insufficient documentation

## 2010-10-08 DIAGNOSIS — E119 Type 2 diabetes mellitus without complications: Secondary | ICD-10-CM | POA: Insufficient documentation

## 2010-10-08 DIAGNOSIS — Z794 Long term (current) use of insulin: Secondary | ICD-10-CM | POA: Insufficient documentation

## 2010-10-08 DIAGNOSIS — Z85038 Personal history of other malignant neoplasm of large intestine: Secondary | ICD-10-CM | POA: Insufficient documentation

## 2010-10-08 DIAGNOSIS — R300 Dysuria: Secondary | ICD-10-CM | POA: Insufficient documentation

## 2010-10-08 DIAGNOSIS — I252 Old myocardial infarction: Secondary | ICD-10-CM | POA: Insufficient documentation

## 2010-10-08 DIAGNOSIS — Z79899 Other long term (current) drug therapy: Secondary | ICD-10-CM | POA: Insufficient documentation

## 2010-10-08 DIAGNOSIS — N509 Disorder of male genital organs, unspecified: Secondary | ICD-10-CM | POA: Insufficient documentation

## 2010-10-08 DIAGNOSIS — I2510 Atherosclerotic heart disease of native coronary artery without angina pectoris: Secondary | ICD-10-CM | POA: Insufficient documentation

## 2010-10-08 DIAGNOSIS — N390 Urinary tract infection, site not specified: Secondary | ICD-10-CM | POA: Insufficient documentation

## 2010-10-08 LAB — CBC
HCT: 35.3 % — ABNORMAL LOW (ref 39.0–52.0)
Hemoglobin: 11.7 g/dL — ABNORMAL LOW (ref 13.0–17.0)
MCHC: 33.1 g/dL (ref 30.0–36.0)
MCV: 70 fL — ABNORMAL LOW (ref 78.0–100.0)
RDW: 15.8 % — ABNORMAL HIGH (ref 11.5–15.5)
WBC: 8.6 10*3/uL (ref 4.0–10.5)

## 2010-10-08 LAB — DIFFERENTIAL
Eosinophils Relative: 1 % (ref 0–5)
Lymphocytes Relative: 18 % (ref 12–46)
Lymphs Abs: 1.6 10*3/uL (ref 0.7–4.0)
Monocytes Absolute: 0.7 10*3/uL (ref 0.1–1.0)
Neutro Abs: 6.3 10*3/uL (ref 1.7–7.7)

## 2010-10-08 LAB — POCT I-STAT, CHEM 8
Chloride: 110 mEq/L (ref 96–112)
Creatinine, Ser: 1.9 mg/dL — ABNORMAL HIGH (ref 0.50–1.35)
Glucose, Bld: 155 mg/dL — ABNORMAL HIGH (ref 70–99)
Potassium: 4.2 mEq/L (ref 3.5–5.1)
Sodium: 141 mEq/L (ref 135–145)

## 2010-10-08 LAB — URINALYSIS, ROUTINE W REFLEX MICROSCOPIC
Ketones, ur: NEGATIVE mg/dL
Nitrite: POSITIVE — AB
Protein, ur: 100 mg/dL — AB
Urobilinogen, UA: 0.2 mg/dL (ref 0.0–1.0)

## 2010-10-10 LAB — GC/CHLAMYDIA PROBE AMP, GENITAL
Chlamydia, DNA Probe: NEGATIVE
GC Probe Amp, Genital: NEGATIVE

## 2011-01-24 LAB — COMPREHENSIVE METABOLIC PANEL
ALT: 16 U/L (ref 0–53)
AST: 13 U/L (ref 0–37)
Albumin: 3.4 g/dL — ABNORMAL LOW (ref 3.5–5.2)
Alkaline Phosphatase: 57 U/L (ref 39–117)
CO2: 25 mEq/L (ref 19–32)
Chloride: 106 mEq/L (ref 96–112)
GFR calc Af Amer: >60 mL/min (ref >60)
GFR calc non Af Amer: 54 mL/min — ABNORMAL LOW (ref >60)
Potassium: 4.1 mEq/L (ref 3.5–5.1)
Sodium: 138 mEq/L (ref 135–145)
Total Bilirubin: 0.5 mg/dL (ref 0.3–1.2)

## 2011-01-24 LAB — WOUND CULTURE: Gram Stain: NONE SEEN

## 2011-01-24 LAB — CBC
MCV: 73.8 fL — ABNORMAL LOW (ref 78.0–100.0)
Platelets: 325 10*3/uL (ref 150–400)
RBC: 4.59 MIL/uL (ref 4.22–5.81)
WBC: 7.7 10*3/uL (ref 4.0–10.5)

## 2011-01-24 LAB — HEMOGLOBIN A1C: Mean Plasma Glucose: 229 mg/dL

## 2011-01-24 LAB — APTT: aPTT: 28 seconds (ref 24–37)

## 2011-01-24 LAB — ANAEROBIC CULTURE

## 2011-01-24 LAB — GLUCOSE, CAPILLARY
Glucose-Capillary: 224 mg/dL — ABNORMAL HIGH (ref 70–99)
Glucose-Capillary: 266 mg/dL — ABNORMAL HIGH (ref 70–99)

## 2012-04-08 ENCOUNTER — Encounter (HOSPITAL_COMMUNITY): Payer: Self-pay | Admitting: *Deleted

## 2012-04-08 ENCOUNTER — Emergency Department (HOSPITAL_COMMUNITY)
Admission: EM | Admit: 2012-04-08 | Discharge: 2012-04-08 | Disposition: A | Discharge status: Home / Self Care | Payer: Medicare Other | Attending: Emergency Medicine | Admitting: Emergency Medicine

## 2012-04-08 DIAGNOSIS — H53149 Visual discomfort, unspecified: Secondary | ICD-10-CM | POA: Insufficient documentation

## 2012-04-08 DIAGNOSIS — F17200 Nicotine dependence, unspecified, uncomplicated: Secondary | ICD-10-CM | POA: Insufficient documentation

## 2012-04-08 DIAGNOSIS — Z794 Long term (current) use of insulin: Secondary | ICD-10-CM | POA: Insufficient documentation

## 2012-04-08 DIAGNOSIS — I10 Essential (primary) hypertension: Secondary | ICD-10-CM | POA: Insufficient documentation

## 2012-04-08 DIAGNOSIS — S88119A Complete traumatic amputation at level between knee and ankle, unspecified lower leg, initial encounter: Secondary | ICD-10-CM | POA: Insufficient documentation

## 2012-04-08 DIAGNOSIS — E119 Type 2 diabetes mellitus without complications: Secondary | ICD-10-CM | POA: Insufficient documentation

## 2012-04-08 DIAGNOSIS — I2510 Atherosclerotic heart disease of native coronary artery without angina pectoris: Secondary | ICD-10-CM | POA: Insufficient documentation

## 2012-04-08 DIAGNOSIS — Z79899 Other long term (current) drug therapy: Secondary | ICD-10-CM | POA: Insufficient documentation

## 2012-04-08 DIAGNOSIS — Z7982 Long term (current) use of aspirin: Secondary | ICD-10-CM | POA: Insufficient documentation

## 2012-04-08 DIAGNOSIS — H109 Unspecified conjunctivitis: Secondary | ICD-10-CM

## 2012-04-08 DIAGNOSIS — I252 Old myocardial infarction: Secondary | ICD-10-CM | POA: Insufficient documentation

## 2012-04-08 HISTORY — DX: Atherosclerotic heart disease of native coronary artery without angina pectoris: I25.10

## 2012-04-08 HISTORY — DX: Acute myocardial infarction, unspecified: I21.9

## 2012-04-08 HISTORY — DX: Essential (primary) hypertension: I10

## 2012-04-08 MED ORDER — TETRACAINE HCL 0.5 % OP SOLN
1.0000 [drp] | Freq: Once | OPHTHALMIC | Status: AC
  Administered 2012-04-08: 1 [drp] via OPHTHALMIC
  Filled 2012-04-08: qty 2

## 2012-04-08 MED ORDER — TOBRAMYCIN 0.3 % OP SOLN
2.0000 [drp] | Freq: Four times a day (QID) | OPHTHALMIC | Status: DC
  Administered 2012-04-08: 2 [drp] via OPHTHALMIC
  Filled 2012-04-08: qty 5

## 2012-04-08 NOTE — ED Provider Notes (Signed)
History   This chart was scribed for Doug Sou, MD by Charolett Bumpers, ED Scribe. The patient was seen in room TR07C/TR07C. Patient's care was started at 1204.   CSN: [id number]  Arrival date & time 04/08/12  1106   First MD Initiated Contact with Patient 04/08/12 1204      Chief Complaint  Patient presents with  . Eye Pain    The history is provided by the patient. No language interpreter was used.  Philip Strong is a 71 y.o. male who presents to the Emergency Department complaining of constant, moderate left eye redness that started 3 days ago. He reports associated pain, swelling and photophobia that started yesterday and has been gradually worsening. He reports the swelling improved this morning. He reports his eyelashes were stuck together this morning He denies any itchiness, discharge, visual disturbances, vomiting, nausea. He denies any known injuries. He reports a h/o HTN, DM and right BKA. He admits to tobacco use but denies any alcohol use. Reports he checked his blood sugar this morning which was in the 140s  Past Medical History  Diagnosis Date  . Diabetes mellitus without complication   . Hypertension   . Coronary artery disease   . Myocardial infarct     Past Surgical History  Procedure Date  . Leg amputation below knee     right    No family history on file.  History  Substance Use Topics  . Smoking status: Current Every Day Smoker  . Smokeless tobacco: Not on file  . Alcohol Use: Yes     Comment: occ      Review of Systems  Constitutional: Negative.   Eyes: Positive for photophobia, pain and redness. Negative for discharge, itching and visual disturbance.  Respiratory: Negative.   Musculoskeletal: Negative.   Skin: Negative.   Neurological: Negative.     Allergies  Review of patient's allergies indicates no known allergies.  Home Medications   Current Outpatient Rx  Name  Route  Sig  Dispense  Refill  . AMLODIPINE BESYLATE 10 MG  PO TABS   Oral   Take 10 mg by mouth daily.         . ASPIRIN 325 MG PO TABS   Oral   Take 325 mg by mouth daily.         Marland Kitchen FERROUS SULFATE 325 (65 FE) MG PO TABS   Oral   Take 325 mg by mouth daily with breakfast.         . INSULIN ISOPHANE & REGULAR (70-30) 100 UNIT/ML [place] SUSP   Subcutaneous   Inject 68 Units into the skin 2 (two) times daily with a meal.         . LISINOPRIL 40 MG PO TABS   Oral   Take 40 mg by mouth daily.         Marland Kitchen METOPROLOL SUCCINATE ER 100 MG PO TB24   Oral   Take 100 mg by mouth daily. Take with or immediately following a meal.         . POTASSIUM CHLORIDE ER 10 MEQ PO TBCR   Oral   Take 10 mEq by mouth daily.         Marland Kitchen RANITIDINE HCL 150 MG PO TABS   Oral   Take 150 mg by mouth daily.           BP 170/46  Pulse 56  Temp 97.9 F (36.6 C) (Oral)  Resp 18  SpO2 95%  Physical Exam  Nursing note and vitals reviewed. Constitutional: He appears well-developed and well-nourished.  HENT:  Head: Normocephalic and atraumatic.  Eyes: Conjunctivae normal are normal. Pupils are equal, round, and reactive to light.  Slit lamp exam:      The left eye shows no fluorescein uptake.       Intraocular pressure in left eye is 10, right eye is 13. Left eye remarkable for syrup for some conjunctival erythema no swelling of lids no pain on extraocular movement on slit-lamp exam no cell and flare fluorescein negative  Neck: Neck supple. No tracheal deviation present. No thyromegaly present.  Cardiovascular:  No murmur heard. Pulmonary/Chest: Effort normal and breath sounds normal.  Abdominal: Soft. He exhibits no distension.       Obese  Musculoskeletal: Normal range of motion. He exhibits no edema.       Right sided BKA  Neurological: He is alert. Coordination normal.  Skin: Skin is warm and dry. No rash noted.  Psychiatric: He has a normal mood and affect.    ED Course  Procedures (including critical care time)  DIAGNOSTIC  STUDIES: Oxygen Saturation is 95% on room air, adequate by my interpretation.    COORDINATION OF CARE:  12:10-Discussed planned course of treatment with the patient including a fundoscopic exam and checking eye pressure with a Tonopen, who is agreeable at this time.   12:28: Intraocular eye pressure checked with Tonopen: Left eye: 10; Right eye: 13   Labs Reviewed - No data to display No results found.   No diagnosis found.    MDM  Plan tobramycin ophthalmic solution 2 drops left eye 4 times daily referral Dr. Karleen Hampshire as needed 2 days Diagnosis conjunctivitis left eye     I personally performed the services described in this documentation, which was scribed in my presence. The recorded information has been reviewed and is accurate.      Doug Sou, MD 04/08/12 1251

## 2012-04-08 NOTE — ED Notes (Signed)
PT family contact number [phone] lorenzo

## 2012-04-08 NOTE — ED Notes (Signed)
Pt is here with left eye red and swelling with out injury.  Sclera is red and irritated.  Pt has sensitivity to light

## 2012-05-01 ENCOUNTER — Encounter: Payer: Self-pay | Admitting: Cardiology

## 2012-05-01 ENCOUNTER — Encounter (INDEPENDENT_AMBULATORY_CARE_PROVIDER_SITE_OTHER): Payer: Medicare Other

## 2012-05-01 ENCOUNTER — Ambulatory Visit (INDEPENDENT_AMBULATORY_CARE_PROVIDER_SITE_OTHER): Payer: Medicare Other | Admitting: Cardiology

## 2012-05-01 VITALS — BP 156/78 | HR 65 | Ht 75.0 in

## 2012-05-01 DIAGNOSIS — I10 Essential (primary) hypertension: Secondary | ICD-10-CM | POA: Insufficient documentation

## 2012-05-01 DIAGNOSIS — I998 Other disorder of circulatory system: Secondary | ICD-10-CM | POA: Insufficient documentation

## 2012-05-01 DIAGNOSIS — I2510 Atherosclerotic heart disease of native coronary artery without angina pectoris: Secondary | ICD-10-CM

## 2012-05-01 DIAGNOSIS — IMO0001 Reserved for inherently not codable concepts without codable children: Secondary | ICD-10-CM

## 2012-05-01 DIAGNOSIS — I739 Peripheral vascular disease, unspecified: Secondary | ICD-10-CM | POA: Insufficient documentation

## 2012-05-01 DIAGNOSIS — E785 Hyperlipidemia, unspecified: Secondary | ICD-10-CM

## 2012-05-01 DIAGNOSIS — E1065 Type 1 diabetes mellitus with hyperglycemia: Secondary | ICD-10-CM

## 2012-05-01 DIAGNOSIS — I70229 Atherosclerosis of native arteries of extremities with rest pain, unspecified extremity: Secondary | ICD-10-CM

## 2012-05-01 DIAGNOSIS — R0989 Other specified symptoms and signs involving the circulatory and respiratory systems: Secondary | ICD-10-CM

## 2012-05-01 DIAGNOSIS — I999 Unspecified disorder of circulatory system: Secondary | ICD-10-CM

## 2012-05-01 HISTORY — DX: Peripheral vascular disease, unspecified: I73.9

## 2012-05-01 LAB — LIPID PANEL
HDL: 17.5 mg/dL — ABNORMAL LOW (ref >39.00)
LDL Cholesterol: 104 mg/dL — ABNORMAL HIGH (ref 0–99)
Total CHOL/HDL Ratio: 9

## 2012-05-01 LAB — HEPATIC FUNCTION PANEL
AST: 11 U/L (ref 0–37)
Total Bilirubin: 0.2 mg/dL — ABNORMAL LOW (ref 0.3–1.2)

## 2012-05-01 LAB — BASIC METABOLIC PANEL
CO2: 25 mEq/L (ref 19–32)
Calcium: 8.7 mg/dL (ref 8.4–10.5)
Creatinine, Ser: 1.2 mg/dL (ref 0.4–1.5)
Glucose, Bld: 203 mg/dL — ABNORMAL HIGH (ref 70–99)

## 2012-05-01 LAB — HEMOGLOBIN A1C: Hgb A1c MFr Bld: 12.6 % — ABNORMAL HIGH (ref 4.6–6.5)

## 2012-05-01 NOTE — Patient Instructions (Signed)
We will check complete lab work today.  We will schedule lower extremity and carotid doppler studies.   We will schedule you for a nuclear stress test.

## 2012-05-01 NOTE — Progress Notes (Signed)
Philip Strong Date of Birth: [birth year] Medical Record #[id number]  History of Present Illness: Philip Strong is seen today at the request of Dr. Isabella Stalling to reestablish cardiac care. He is a pleasant 72 year old black male with history of insulin-dependent diabetes mellitus, hypertension, and tobacco use. He has a history of coronary disease with remote myocardial infarction. He underwent coronary bypass surgery apparently in 1999. No old records are available. 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 He later developed an ulceration of the stump which has been slow to heal. He really denies any significant cardiac symptoms. He denies chest pain, shortness of breath, or palpitations. Over the past 2 weeks he has developed severe pain in his left great toe. His pain is constant and sore to touch. He has no history of TIA or stroke. While he does smoke occasional cigarettes he states he mostly smokes marijuana.  Current Outpatient Prescriptions on File Prior to Visit  Medication Sig Dispense Refill  . amLODipine (NORVASC) 10 MG tablet Take 10 mg by mouth daily.      Marland Kitchen aspirin 325 MG tablet Take 325 mg by mouth daily.      . ferrous sulfate 325 (65 FE) MG tablet Take 325 mg by mouth daily with breakfast.      . furosemide (LASIX) 20 MG tablet Take 20 mg by mouth daily.      . insulin NPH-insulin regular (NOVOLIN 70/30) (70-30) 100 UNIT/ML injection Inject 68 Units into the skin 2 (two) times daily with a meal.      . lisinopril (PRINIVIL,ZESTRIL) 40 MG tablet Take 40 mg by mouth daily.      . metoprolol succinate (TOPROL-XL) 100 MG 24 hr tablet Take 100 mg by mouth daily. Take with or immediately following a meal.      . potassium chloride (K-DUR) 10 MEQ tablet Take 10 mEq by mouth daily.      . ranitidine (ZANTAC) 150 MG tablet Take 150 mg by mouth daily.        No Known Allergies  Past Medical History  Diagnosis Date  . Diabetes mellitus, insulin dependent (IDDM),  uncontrolled   . Hypertension   . Coronary artery disease   . Myocardial infarct   . 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 PAD (peripheral artery disease) 05/01/2012  . Ischemic toe 05/01/2012  . Hyperlipidemia     Past Surgical History  Procedure Date  . Leg amputation below knee     right  . Coronary artery bypass graft   . Right patella removal   . Right finger fracture   . Closed reduction clavicle fracture     History  Smoking status  . Current Every Day Smoker  Smokeless tobacco  . Not on file    History  Alcohol Use  . Yes    Comment: occ    Family History  Problem Relation Age of Onset  . Heart attack Mother     Review of Systems: As noted in history of present illness. He reports that recently his blood sugars have been poorly controlled with consistent readings over 300. All other systems were reviewed and are negative.  Physical Exam: BP 156/78  Pulse 65  Ht 6\' 3"  (1.905 m)  SpO2 99% Is a very pleasant, obese black male in no acute distress. HEENT: Normocephalic, atraumatic. Pupils are equal round and reactive to light accommodation. Extraocular movements are  full. His oropharynx is clear with recent dental extraction. Neck is supple no JVD, adenopathy, or thyromegaly. He has bilateral carotid bruits. Lungs: Clear to auscultation percussion. Cardiovascular: Regular rate and rhythm area to normal S1 and S2. No gallop, murmur, or click. Abdomen: Soft, nontender. Obese. Bowel sounds are positive. No hepatosplenomegaly. Extremities: He has a right BKA. His incision has healed well without significant ulceration. He has hair loss on his left lower remedy with absent pedal pulses. He has a small red lesion on the bed of his left great toe that is very tender to touch. 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 Skin: Otherwise warm and dry Neuro: Alert and oriented x3. Cranial nerves II through XII are  intact. LABORATORY DATA: ECG today demonstrates normal sinus rhythm with nonspecific T-wave abnormality.  Assessment / Plan: 1. Coronary disease with remote myocardial infarction and coronary bypass surgery. Old records are not available. He has had no formal ischemic evaluation for many years. We'll schedule him for a lexiscan Myoview study. We will continue with amlodipine, ACE inhibitor, and metoprolol. Continue aspirin. We need to consider statin therapy depending on the results of his lab work. We will obtain complete lab work today including chemistries, lipid panel, CBC, and A1c.  2. PAD status post right BKA. Patient appears to have an acutely ischemic lesion on his left great toe. He has no pedal pulses. We will obtain lower extremity arterial Dopplers today. If this confirms poor circulation we will refer him for arteriogram. I stressed the importance of smoking cessation.  3. Carotid bruits. We will obtain carotid Doppler studies.  4. This would've been a diabetes mellitus, poorly controlled  5. Hypertension.  6. History of prostate CA status post radioactive seed implant.

## 2012-05-06 ENCOUNTER — Institutional Professional Consult (permissible substitution): Payer: Medicare Other | Admitting: Cardiovascular Disease

## 2012-05-06 ENCOUNTER — Other Ambulatory Visit: Payer: Self-pay

## 2012-05-06 DIAGNOSIS — E785 Hyperlipidemia, unspecified: Secondary | ICD-10-CM

## 2012-05-06 MED ORDER — ATORVASTATIN CALCIUM 20 MG PO TABS: 20.0000 mg | ORAL_TABLET | Freq: Every day | ORAL | Status: DC

## 2012-05-07 ENCOUNTER — Encounter: Payer: Self-pay | Admitting: Dermatopathology

## 2012-05-08 ENCOUNTER — Encounter: Payer: Self-pay | Admitting: Cardiovascular Disease

## 2012-05-08 ENCOUNTER — Ambulatory Visit (INDEPENDENT_AMBULATORY_CARE_PROVIDER_SITE_OTHER): Payer: Medicare Other | Admitting: Cardiovascular Disease

## 2012-05-08 VITALS — BP 142/50 | HR 61 | Ht 75.0 in

## 2012-05-08 DIAGNOSIS — Z01812 Encounter for preprocedural laboratory examination: Secondary | ICD-10-CM

## 2012-05-08 DIAGNOSIS — I739 Peripheral vascular disease, unspecified: Secondary | ICD-10-CM

## 2012-05-08 DIAGNOSIS — E1065 Type 1 diabetes mellitus with hyperglycemia: Secondary | ICD-10-CM

## 2012-05-08 DIAGNOSIS — IMO0002 Reserved for concepts with insufficient information to code with codable children: Secondary | ICD-10-CM

## 2012-05-08 MED ORDER — CLOPIDOGREL BISULFATE 75 MG PO TABS: 75.0000 mg | ORAL_TABLET | Freq: Every day | ORAL | Status: DC

## 2012-05-08 NOTE — Patient Instructions (Addendum)
You are scheduled for an abdominal aortogram on 05/15/12.  Please see your letter for instructions.  Your physician has recommended you make the following change in your medication: START Plavix 75 mg once daily  Your physician recommends that you return for lab work in: today (bmet, cbc, Customer service manager)

## 2012-05-08 NOTE — Assessment & Plan Note (Signed)
The patient has evidence of critical limb ischemia on the left side with rest pain and early ischemic changes. I recommend starting Plavix 75 mg once daily and proceeding with abdominal aortogram, lower extremity runoff and possible angioplasty. I will plan to obtain access via the right femoral artery. The pulse seems to be diminished there. If he has significant iliac disease on that side or significant tortuosity, I might have to proceed with antegrade femoral access on the left side.

## 2012-05-08 NOTE — Addendum Note (Signed)
Addended by: Lorine Bears A on: 05/08/2012 05:58 PM   Modules accepted: Orders

## 2012-05-08 NOTE — Progress Notes (Signed)
  HPI  This is a pleasant 71-year-old African American male who was referred by Dr. Jordan for evaluation and management of peripheral arterial disease. His primary care physician is Dr. Kates. He has known history of insulin-dependent diabetes mellitus, hypertension, and tobacco use. He has a history of coronary disease with remote myocardial infarction. He underwent coronary bypass surgery apparently in 1999. No old records are available. 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 He later developed an ulceration of the stump which has been slow to heal. Angiogram at that time showed occluded right SFA. He denies chest pain, shortness of breath, or palpitations. Over the past 3 weeks he has developed severe pain in his left great toe. His pain is constant and sore to touch. He has no history of TIA or stroke. While he does smoke occasional cigarettes he states he mostly smokes marijuana. Duplex ultrasound showed evidence of inflow disease worse on the right side. The proximal SFA was occluded on the right side. Left SFA was diffusely diseased. ABI on the left side was 0.35 with no waveforms to obtain toe pressure. We tried to see the patient early her for consultation after that test but he could not make it to the appointment due to transportation issues.   No Known Allergies   Current Outpatient Prescriptions on File Prior to Visit  Medication Sig Dispense Refill  . amLODipine (NORVASC) 10 MG tablet Take 10 mg by mouth daily.      . aspirin 325 MG tablet Take 325 mg by mouth daily.      . atorvastatin (LIPITOR) 20 MG tablet Take 1 tablet (20 mg total) by mouth daily.  30 tablet  6  . ferrous sulfate 325 (65 FE) MG tablet Take 325 mg by mouth daily with breakfast.      . furosemide (LASIX) 20 MG tablet Take 20 mg by mouth daily.      . insulin NPH-insulin regular (NOVOLIN 70/30) (70-30) 100 UNIT/ML injection Inject 68 Units into the skin 2 (two) times daily with a meal.      .  lisinopril (PRINIVIL,ZESTRIL) 40 MG tablet Take 40 mg by mouth daily.      . metoprolol succinate (TOPROL-XL) 100 MG 24 hr tablet Take 100 mg by mouth daily. Take with or immediately following a meal.      . potassium chloride (K-DUR) 10 MEQ tablet Take 10 mEq by mouth daily.      . ranitidine (ZANTAC) 150 MG tablet Take 150 mg by mouth daily.         Past Medical History  Diagnosis Date  . Diabetes mellitus, insulin dependent (IDDM), uncontrolled   . Hypertension   . Coronary artery disease   . Myocardial infarct   . 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 PAD (peripheral artery disease) 05/01/2012  . Ischemic toe 05/01/2012  . Hyperlipidemia   . Prostate ca     s/p radioactive seed implant     Past Surgical History  Procedure Date  . Leg amputation below knee     right  . Coronary artery bypass graft   . Right patella removal   . Right finger fracture   . Closed reduction clavicle fracture      Family History  Problem Relation Age of Onset  . Heart attack Mother      History   Social History  . Marital Status: Divorced      Spouse Name: N/A    Number of Children: N/A  . Years of Education: N/A   Occupational History  . Not on file.   Social History Main Topics  . Smoking status: Light Tobacco Smoker  . Smokeless tobacco: Not on file  . Alcohol Use: Yes     Comment: occ  . Drug Use: Yes    Special: Marijuana  . Sexually Active: Not on file   Other Topics Concern  . Not on file   Social History Narrative  . No narrative on file     ROS Constitutional: Negative for fever, chills, diaphoresis, activity change, appetite change and fatigue.  HENT: Negative for hearing loss, nosebleeds, congestion, sore throat, facial swelling, drooling, trouble swallowing, neck pain, voice change, sinus pressure and tinnitus.  Eyes: Negative for photophobia, pain, discharge and visual disturbance.  Respiratory: Negative for apnea,  cough, chest tightness, shortness of breath and wheezing.  Cardiovascular: Negative for chest pain, palpitations and leg swelling.  Gastrointestinal: Negative for nausea, vomiting, abdominal pain, diarrhea, constipation, blood in stool and abdominal distention.  Genitourinary: Negative for dysuria, urgency, frequency, hematuria and decreased urine volume.  Skin: Negative for color change, pallor, rash and wound.  Neurological: Negative for dizziness, tremors, seizures, syncope, speech difficulty, weakness, light-headedness, numbness and headaches.  Psychiatric/Behavioral: Negative for suicidal ideas, hallucinations, behavioral problems and agitation. The patient is not nervous/anxious.     PHYSICAL EXAM   BP 142/50  Pulse 61  Ht 6' 3" (1.905 m)  SpO2 99% Constitutional: He is oriented to person, place, and time. He appears well-developed and well-nourished. No distress.  HENT: No nasal discharge.  Head: Normocephalic and atraumatic.  Eyes: Pupils are equal and round. Right eye exhibits no discharge. Left eye exhibits no discharge.  Neck: Normal range of motion. Neck supple. No JVD present. No thyromegaly present.  Cardiovascular: Normal rate, regular rhythm, normal heart sounds and. Exam reveals no gallop and no friction rub. No murmur heard.  Pulmonary/Chest: Effort normal and breath sounds normal. No stridor. No respiratory distress. He has no wheezes. He has no rales. He exhibits no tenderness.  Abdominal: Soft. Bowel sounds are normal. He exhibits no distension. There is no tenderness. There is no rebound and no guarding.  Musculoskeletal: Normal range of motion. He exhibits no edema and no tenderness.  Neurological: He is alert and oriented to person, place, and time. Coordination normal.  Skin: Skin is warm and dry. No rash noted. He is not diaphoretic. No erythema. No pallor.  Psychiatric: He has a normal mood and affect. His behavior is normal. Judgment and thought content normal.   Vascular: Femoral pulses are diminished. Distal pulses in the left side are not palpable. Left foot is tender and mildly swollen. He has a small area of discoloration at the tip of the right big toe.     ASSESSMENT AND PLAN   

## 2012-05-09 ENCOUNTER — Encounter (HOSPITAL_COMMUNITY): Payer: Self-pay

## 2012-05-09 LAB — CBC WITH DIFFERENTIAL/PLATELET
Basophils Absolute: 0 10*3/uL (ref 0.0–0.1)
Basophils Relative: 0.1 % (ref 0.0–3.0)
Eosinophils Relative: 1 % (ref 0.0–5.0)
HCT: 30.2 % — ABNORMAL LOW (ref 39.0–52.0)
Hemoglobin: 9.4 g/dL — ABNORMAL LOW (ref 13.0–17.0)
Lymphs Abs: 1.6 10*3/uL (ref 0.7–4.0)
Monocytes Relative: 3.2 % (ref 3.0–12.0)
Neutro Abs: 7.1 10*3/uL (ref 1.4–7.7)
RBC: 4.2 Mil/uL — ABNORMAL LOW (ref 4.22–5.81)
RDW: 15.5 % — ABNORMAL HIGH (ref 11.5–14.6)

## 2012-05-09 LAB — BASIC METABOLIC PANEL
BUN: 23 mg/dL (ref 6–23)
CO2: 24 mEq/L (ref 19–32)
Calcium: 8.9 mg/dL (ref 8.4–10.5)
Chloride: 105 mEq/L (ref 96–112)
Creatinine, Ser: 1.2 mg/dL (ref 0.4–1.5)
Glucose, Bld: 287 mg/dL — ABNORMAL HIGH (ref 70–99)

## 2012-05-10 ENCOUNTER — Ambulatory Visit (INDEPENDENT_AMBULATORY_CARE_PROVIDER_SITE_OTHER): Payer: Medicare Other | Admitting: *Deleted

## 2012-05-10 DIAGNOSIS — I999 Unspecified disorder of circulatory system: Secondary | ICD-10-CM

## 2012-05-10 DIAGNOSIS — I2510 Atherosclerotic heart disease of native coronary artery without angina pectoris: Secondary | ICD-10-CM

## 2012-05-10 DIAGNOSIS — I739 Peripheral vascular disease, unspecified: Secondary | ICD-10-CM

## 2012-05-10 DIAGNOSIS — I998 Other disorder of circulatory system: Secondary | ICD-10-CM

## 2012-05-10 DIAGNOSIS — E1065 Type 1 diabetes mellitus with hyperglycemia: Secondary | ICD-10-CM

## 2012-05-15 ENCOUNTER — Ambulatory Visit (HOSPITAL_COMMUNITY)
Admission: RE | Admit: 2012-05-15 | Discharge: 2012-05-15 | Disposition: A | Discharge status: Home / Self Care | Payer: Medicare Other | Source: Ambulatory Visit | Attending: Cardiovascular Disease | Admitting: Cardiovascular Disease

## 2012-05-15 ENCOUNTER — Encounter (HOSPITAL_COMMUNITY)
Admission: RE | Disposition: A | Discharge status: Home / Self Care | Payer: Self-pay | Source: Ambulatory Visit | Attending: Cardiovascular Disease

## 2012-05-15 DIAGNOSIS — Z7902 Long term (current) use of antithrombotics/antiplatelets: Secondary | ICD-10-CM | POA: Insufficient documentation

## 2012-05-15 DIAGNOSIS — I739 Peripheral vascular disease, unspecified: Secondary | ICD-10-CM

## 2012-05-15 DIAGNOSIS — I252 Old myocardial infarction: Secondary | ICD-10-CM | POA: Insufficient documentation

## 2012-05-15 DIAGNOSIS — E785 Hyperlipidemia, unspecified: Secondary | ICD-10-CM | POA: Insufficient documentation

## 2012-05-15 DIAGNOSIS — I70209 Unspecified atherosclerosis of native arteries of extremities, unspecified extremity: Secondary | ICD-10-CM | POA: Insufficient documentation

## 2012-05-15 DIAGNOSIS — F1210 Cannabis abuse, uncomplicated: Secondary | ICD-10-CM | POA: Insufficient documentation

## 2012-05-15 DIAGNOSIS — Z951 Presence of aortocoronary bypass graft: Secondary | ICD-10-CM | POA: Insufficient documentation

## 2012-05-15 DIAGNOSIS — S88119A Complete traumatic amputation at level between knee and ankle, unspecified lower leg, initial encounter: Secondary | ICD-10-CM | POA: Insufficient documentation

## 2012-05-15 DIAGNOSIS — I10 Essential (primary) hypertension: Secondary | ICD-10-CM | POA: Insufficient documentation

## 2012-05-15 DIAGNOSIS — I70269 Atherosclerosis of native arteries of extremities with gangrene, unspecified extremity: Secondary | ICD-10-CM

## 2012-05-15 DIAGNOSIS — Z794 Long term (current) use of insulin: Secondary | ICD-10-CM | POA: Insufficient documentation

## 2012-05-15 DIAGNOSIS — E119 Type 2 diabetes mellitus without complications: Secondary | ICD-10-CM | POA: Insufficient documentation

## 2012-05-15 DIAGNOSIS — F17200 Nicotine dependence, unspecified, uncomplicated: Secondary | ICD-10-CM | POA: Insufficient documentation

## 2012-05-15 DIAGNOSIS — Z79899 Other long term (current) drug therapy: Secondary | ICD-10-CM | POA: Insufficient documentation

## 2012-05-15 HISTORY — PX: ABDOMINAL AORTAGRAM: SHX5454

## 2012-05-15 HISTORY — PX: LOWER EXTREMITY ANGIOGRAM: SHX5508

## 2012-05-15 LAB — GLUCOSE, CAPILLARY: Glucose-Capillary: 288 mg/dL — ABNORMAL HIGH (ref 70–99)

## 2012-05-15 SURGERY — ANGIOGRAM, LOWER EXTREMITY
Anesthesia: LOCAL | Laterality: Bilateral

## 2012-05-15 MED ORDER — SODIUM CHLORIDE 0.9 % IJ SOLN: 3.0000 mL | Freq: Two times a day (BID) | INTRAMUSCULAR | Status: DC

## 2012-05-15 MED ORDER — MIDAZOLAM HCL 2 MG/2ML IJ SOLN
INTRAMUSCULAR | Status: AC
  Filled 2012-05-15: qty 2

## 2012-05-15 MED ORDER — SODIUM CHLORIDE 0.9 % IJ SOLN: 3.0000 mL | INTRAMUSCULAR | Status: DC | PRN

## 2012-05-15 MED ORDER — ACETAMINOPHEN 325 MG PO TABS: 650.0000 mg | ORAL_TABLET | ORAL | Status: DC | PRN

## 2012-05-15 MED ORDER — FENTANYL CITRATE 0.05 MG/ML IJ SOLN
INTRAMUSCULAR | Status: AC
  Filled 2012-05-15: qty 2

## 2012-05-15 MED ORDER — ASPIRIN 81 MG PO CHEW
324.0000 mg | CHEWABLE_TABLET | ORAL | Status: AC
  Administered 2012-05-15: 324 mg via ORAL
  Filled 2012-05-15: qty 4

## 2012-05-15 MED ORDER — SODIUM CHLORIDE 0.9 % IV SOLN
INTRAVENOUS | Status: DC
  Administered 2012-05-15: 1000 mL via INTRAVENOUS

## 2012-05-15 MED ORDER — OXYCODONE HCL 5 MG PO TABS: 5.0000 mg | ORAL_TABLET | Freq: Four times a day (QID) | ORAL | Status: DC | PRN

## 2012-05-15 MED ORDER — SODIUM CHLORIDE 0.9 % IV SOLN
INTRAVENOUS | Status: DC
  Administered 2012-05-15: 09:00:00 via INTRAVENOUS

## 2012-05-15 MED ORDER — SODIUM CHLORIDE 0.9 % IV SOLN: 250.0000 mL | INTRAVENOUS | Status: DC | PRN

## 2012-05-15 NOTE — CV Procedure (Signed)
PERIPHERAL VASCULAR PROCEDURE  NAME:  Philip Strong   MRN: [id number] DOB:  05-15-1940   ADMIT DATE: 05/15/2012  Performing Cardiologist: Lorine Bears Primary Physician: [name], MD Primary Cardiologist:  Peter Swaziland, M.D.  Procedures Performed:  Abdominal Aortic Angiogram without Bi-Iliofemoral Runoff  Selective left Lower Extremity Angiography with Runoff  Indication(s):   Critical Limb Ischemia in left lower extremity. Previous below the knee amputation on the right side.   Consent: The procedure with Risks/Benefits/Alternatives and Indications was reviewed with the patient.  All questions were answered.  Medications:  Sedation: 3 mg IV Versed, 100  mcg IV Fentanyl  Contrast:  103 ml Visipaque.  Procedural details: The right groin was prepped, draped, and anesthetized with 1% lidocaine. Using modified Seldinger technique, a 5 French sheath was introduced into the right common femoral artery. The pulse was weak , I initially axis the right femoral vein and thus a 5 French sheath was placed there before placing a 5 French sheath in the right femoral artery. A 5 Fr Short Pigtail Catheter was advanced of over a  Versicore wire into the descending Aorta to a level just above the renal arteries. A power injection of 44ml/sec contrast over 1 sec was performed for Abdominal Aortic Angiography.  The catheter was then pulled back to a level just above the Aortic bifurcation, and a second power injection was performed to evaluate iliac arteries without runoff.  The pigtail catheter was a change over the versicolor wire for A crossover catheter which was then pulled back the aortic bifurcation and the wire was advanced down the contralateral common iliac artery the wire was then advanced to the contralateral common femoral artery, the catheter was exchanged into a straight tip catheter which was advanced over the wire to the external iliac artery. Contralateral second-order lower  extremity angiography was performed via power injection and bolus chase all the way to the foot. There was significant difficulty in advancing the catheter to the contralateral iliac artery due to very narrow angulation of the iliac bifurcation. We could not fully sedate the patient and he had continuous movement and discomfort in the hip area which made continuing the procedure unsafe.   Hemodynamics:  Central Aortic Pressure / Mean Aortic Pressure: 197/77  Findings:  Abdominal aorta: Normal in size with mild diffuse atherosclerosis.  Left renal artery: Mild 30% ostial disease.  Right renal artery: Normal.  Celiac artery: Patent.  Superior mesenteric artery: Patent.  Right common iliac artery: Minor irregularities.  Right internal iliac artery: Diffuse proximal disease but not well visualized due to motion.  Right external iliac artery: Minor irregularities.  Right common femoral artery: Minor irregularities.  Right profunda femoral artery: Diffuse proximal disease.  Right superficial femoral artery: Occluded at the ostium.  Left common iliac artery:  Diffuse 20% disease.  Left internal iliac artery: Patent with 30% proximal disease.  Left external iliac artery: 30% proximal disease.  Left common femoral artery: Minor irregularities.  Left profunda femoral artery: Patent with mild diffuse disease. 90% mid disease.  Left superficial femoral artery:  50% ostial stenosis. The midsegment there is diffuse 70-90% disease all the way to the distal segment.  Left popliteal artery: Minor irregularities.  Left tibial peroneal trunk: Minor irregularities.  Left anterior tibial artery: Patent and this is the main vessel supplying the left lower extremity. There is 50% disease proximally. There is subtotal occlusion in the midsegment for about 3-4 cm. The vessel extends all the way distally.  Left peroneal artery: Occluded proximally and reconstitutes distally via collaterals  from the anterior tibial  Left posterior tibial artery: Occluded proximally but fills via collaterals.  Conclusions: 1. No significant aortoiliac disease. 2. Significant and diffuse mid SFA disease on the left side with one-vessel runoff mainly via the anterior tibial artery which is subtotally occluded in the midsegment.  Recommendations:  The patient would benefit from treating his left SFA disease as well as anterior tibial disease percutaneously. However, we could not sedate him during the procedure with continuous movement which made the procedure risky. Also his iliac bifurcation has very narrow angulation and there is a possibility of inability to advance the sheath from the contralateral approach. The arm approach is not possible due to disease distally in the tibial artery. The left side antegrade stick might be the best option that it would be difficult given his obesity. Regardless, all these interventions would have to be planned likely with general anesthesia.   Lorine Bears, MD, [facility] 05/15/2012 12:09 PM

## 2012-05-15 NOTE — Interval H&P Note (Signed)
History and Physical Interval Note:  05/15/2012 11:00 AM  Philip Strong  has presented today for surgery, with the diagnosis of pvd  The various methods of treatment have been discussed with the patient and family. After consideration of risks, benefits and other options for treatment, the patient has consented to  Procedure(s) (LRB) with comments: LOWER EXTREMITY ANGIOGRAM (Bilateral) as a surgical intervention .  The patient's history has been reviewed, patient examined, no change in status, stable for surgery.  I have reviewed the patient's chart and labs.  Questions were answered to the patient's satisfaction.     Lorine Bears

## 2012-05-15 NOTE — Progress Notes (Signed)
C/O 10/10 LEFT FOOT PAIN AND DR ARIDA NOTIFIED AND PER DR Jewel Baize PA BROUGHT CLIENT PRESCRIPTION FOR PAIN MED; CLIENT UP AND RIGHT GROIN STABLE; NO BLEEDING OR HEMATOMA

## 2012-05-15 NOTE — H&P (View-Only) (Signed)
HPI  This is a pleasant 72 year old African American male who was referred by Dr. Swaziland for evaluation and management of peripheral arterial disease. His primary care physician is Dr. Isabella Stalling. He has known history of insulin-dependent diabetes mellitus, hypertension, and tobacco use. He has a history of coronary disease with remote myocardial infarction. He underwent coronary bypass surgery apparently in 1999. No old records are available. 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 He later developed an ulceration of the stump which has been slow to heal. Angiogram at that time showed occluded right SFA. He denies chest pain, shortness of breath, or palpitations. Over the past 3 weeks he has developed severe pain in his left great toe. His pain is constant and sore to touch. He has no history of TIA or stroke. While he does smoke occasional cigarettes he states he mostly smokes marijuana. Duplex ultrasound showed evidence of inflow disease worse on the right side. The proximal SFA was occluded on the right side. Left SFA was diffusely diseased. ABI on the left side was 0.35 with no waveforms to obtain toe pressure. We tried to see the patient early her for consultation after that test but he could not make it to the appointment due to transportation issues.   No Known Allergies   Current Outpatient Prescriptions on File Prior to Visit  Medication Sig Dispense Refill  . amLODipine (NORVASC) 10 MG tablet Take 10 mg by mouth daily.      Marland Kitchen aspirin 325 MG tablet Take 325 mg by mouth daily.      Marland Kitchen atorvastatin (LIPITOR) 20 MG tablet Take 1 tablet (20 mg total) by mouth daily.  30 tablet  6  . ferrous sulfate 325 (65 FE) MG tablet Take 325 mg by mouth daily with breakfast.      . furosemide (LASIX) 20 MG tablet Take 20 mg by mouth daily.      . insulin NPH-insulin regular (NOVOLIN 70/30) (70-30) 100 UNIT/ML injection Inject 68 Units into the skin 2 (two) times daily with a meal.      .  lisinopril (PRINIVIL,ZESTRIL) 40 MG tablet Take 40 mg by mouth daily.      . metoprolol succinate (TOPROL-XL) 100 MG 24 hr tablet Take 100 mg by mouth daily. Take with or immediately following a meal.      . potassium chloride (K-DUR) 10 MEQ tablet Take 10 mEq by mouth daily.      . ranitidine (ZANTAC) 150 MG tablet Take 150 mg by mouth daily.         Past Medical History  Diagnosis Date  . Diabetes mellitus, insulin dependent (IDDM), uncontrolled   . Hypertension   . Coronary artery disease   . Myocardial infarct   . 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 PAD (peripheral artery disease) 05/01/2012  . Ischemic toe 05/01/2012  . Hyperlipidemia   . Prostate ca     s/p radioactive seed implant     Past Surgical History  Procedure Date  . Leg amputation below knee     right  . Coronary artery bypass graft   . Right patella removal   . Right finger fracture   . Closed reduction clavicle fracture      Family History  Problem Relation Age of Onset  . Heart attack Mother      History   Social History  . Marital Status: Divorced  Spouse Name: N/A    Number of Children: N/A  . Years of Education: N/A   Occupational History  . Not on file.   Social History Main Topics  . Smoking status: Light Tobacco Smoker  . Smokeless tobacco: Not on file  . Alcohol Use: Yes     Comment: occ  . Drug Use: Yes    Special: Marijuana  . Sexually Active: Not on file   Other Topics Concern  . Not on file   Social History Narrative  . No narrative on file     ROS Constitutional: Negative for fever, chills, diaphoresis, activity change, appetite change and fatigue.  HENT: Negative for hearing loss, nosebleeds, congestion, sore throat, facial swelling, drooling, trouble swallowing, neck pain, voice change, sinus pressure and tinnitus.  Eyes: Negative for photophobia, pain, discharge and visual disturbance.  Respiratory: Negative for apnea,  cough, chest tightness, shortness of breath and wheezing.  Cardiovascular: Negative for chest pain, palpitations and leg swelling.  Gastrointestinal: Negative for nausea, vomiting, abdominal pain, diarrhea, constipation, blood in stool and abdominal distention.  Genitourinary: Negative for dysuria, urgency, frequency, hematuria and decreased urine volume.  Skin: Negative for color change, pallor, rash and wound.  Neurological: Negative for dizziness, tremors, seizures, syncope, speech difficulty, weakness, light-headedness, numbness and headaches.  Psychiatric/Behavioral: Negative for suicidal ideas, hallucinations, behavioral problems and agitation. The patient is not nervous/anxious.     PHYSICAL EXAM   BP 142/50  Pulse 61  Ht 6\' 3"  (1.905 m)  SpO2 99% Constitutional: He is oriented to person, place, and time. He appears well-developed and well-nourished. No distress.  HENT: No nasal discharge.  Head: Normocephalic and atraumatic.  Eyes: Pupils are equal and round. Right eye exhibits no discharge. Left eye exhibits no discharge.  Neck: Normal range of motion. Neck supple. No JVD present. No thyromegaly present.  Cardiovascular: Normal rate, regular rhythm, normal heart sounds and. Exam reveals no gallop and no friction rub. No murmur heard.  Pulmonary/Chest: Effort normal and breath sounds normal. No stridor. No respiratory distress. He has no wheezes. He has no rales. He exhibits no tenderness.  Abdominal: Soft. Bowel sounds are normal. He exhibits no distension. There is no tenderness. There is no rebound and no guarding.  Musculoskeletal: Normal range of motion. He exhibits no edema and no tenderness.  Neurological: He is alert and oriented to person, place, and time. Coordination normal.  Skin: Skin is warm and dry. No rash noted. He is not diaphoretic. No erythema. No pallor.  Psychiatric: He has a normal mood and affect. His behavior is normal. Judgment and thought content normal.   Vascular: Femoral pulses are diminished. Distal pulses in the left side are not palpable. Left foot is tender and mildly swollen. He has a small area of discoloration at the tip of the right big toe.     ASSESSMENT AND PLAN

## 2012-05-20 ENCOUNTER — Telehealth: Payer: Self-pay

## 2012-05-20 NOTE — Telephone Encounter (Signed)
Appt scheduled for 3:00 on 05/21/12 with Dr Kirke Corin.  Pt notified of date and time.

## 2012-05-20 NOTE — Telephone Encounter (Signed)
Message copied by Zola Button on Mon May 20, 2012  8:51 AM ------      Message from: Lorine Bears A      Created: Sun May 19, 2012  3:33 PM       Add him to schedule on Wed. I need to discuss angioplasty with him with general anesthesia.

## 2012-05-22 ENCOUNTER — Ambulatory Visit: Payer: Medicare Other | Admitting: Cardiovascular Disease

## 2012-05-29 ENCOUNTER — Ambulatory Visit (INDEPENDENT_AMBULATORY_CARE_PROVIDER_SITE_OTHER): Payer: Medicare Other | Admitting: Cardiovascular Disease

## 2012-05-29 ENCOUNTER — Encounter: Payer: Self-pay | Admitting: Cardiovascular Disease

## 2012-05-29 VITALS — BP 160/80 | HR 90

## 2012-05-29 DIAGNOSIS — I739 Peripheral vascular disease, unspecified: Secondary | ICD-10-CM

## 2012-05-29 DIAGNOSIS — I2510 Atherosclerotic heart disease of native coronary artery without angina pectoris: Secondary | ICD-10-CM

## 2012-05-29 DIAGNOSIS — Z01812 Encounter for preprocedural laboratory examination: Secondary | ICD-10-CM

## 2012-05-29 NOTE — Patient Instructions (Addendum)
Your physician recommends that you return for lab work in: today (bmet , pt/inr, cbc)  Your procedure is scheduled on 06/05/12, please see your letter for details.  Your physician has requested that you have an echocardiogram before 06/05/12.. Echocardiography is a painless test that uses sound waves to create images of your heart. It provides your doctor with information about the size and shape of your heart and how well your heart's chambers and valves are working. This procedure takes approximately one hour. There are no restrictions for this procedure.

## 2012-05-29 NOTE — Progress Notes (Signed)
HPI  This is a pleasant 72 year old African American male is here today for a followup visit regarding peripheral arterial disease.  He has known history of insulin-dependent diabetes mellitus, hypertension, and tobacco use. He has a history of coronary disease with remote myocardial infarction. He underwent coronary bypass surgery apparently in 1999. No old records are available. 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 He later developed an ulceration of the stump which has been slow to heal. Angiogram at that time showed occluded right SFA. He denies chest pain, shortness of breath, or palpitations. Over the past 6 weeks he has developed severe pain in his left great toe. His pain is constant and sore to touch.  He underwent angiography which showed severe diffuse mid left SFA disease as well as one-vessel runoff below the knee with 99% stenosis in the anterior tibial artery which is the only artery supplying the foot. During the procedure, I could not fully sedate the patient and he kept moving which made the procedure somewhat risky. I did not proceed with intervention due to that reason. Also his aortic bifurcation prevented the contralateral approach. The pain in his left foot is getting worse with discoloration in the first and third toes which is getting worse. He is using oxycodone on a regular basis for pain control.  No Known Allergies   Current Outpatient Prescriptions on File Prior to Visit  Medication Sig Dispense Refill  . amLODipine (NORVASC) 10 MG tablet Take 10 mg by mouth daily.      Marland Kitchen aspirin 325 MG tablet Take 325 mg by mouth daily.      Marland Kitchen atorvastatin (LIPITOR) 20 MG tablet Take 1 tablet (20 mg total) by mouth daily.  30 tablet  6  . carboxymethylcellulose (REFRESH PLUS) 0.5 % SOLN Place 1 drop into both eyes 3 (three) times daily as needed. burning      . clopidogrel (PLAVIX) 75 MG tablet Take 1 tablet (75 mg total) by mouth daily.  90 tablet  1  . ferrous  sulfate 325 (65 FE) MG tablet Take 325 mg by mouth daily with breakfast.      . furosemide (LASIX) 20 MG tablet Take 20 mg by mouth daily.      Marland Kitchen HYDROcodone-acetaminophen (NORCO/VICODIN) 5-325 MG per tablet Take 1 tablet by mouth every 6 (six) hours as needed.      . insulin NPH-insulin regular (NOVOLIN 70/30) (70-30) 100 UNIT/ML injection Inject 70 Units into the skin 2 (two) times daily with a meal.       . lisinopril (PRINIVIL,ZESTRIL) 40 MG tablet Take 40 mg by mouth daily.      . metoprolol succinate (TOPROL-XL) 100 MG 24 hr tablet Take 100 mg by mouth daily. Take with or immediately following a meal.      . oxyCODONE (ROXICODONE) 5 MG immediate release tablet Take 1 tablet (5 mg total) by mouth every 6 (six) hours as needed for pain.  10 tablet  0  . potassium chloride (K-DUR) 10 MEQ tablet Take 10 mEq by mouth daily.      . ranitidine (ZANTAC) 150 MG tablet Take 150 mg by mouth daily.         Past Medical History  Diagnosis Date  . Diabetes mellitus, insulin dependent (IDDM), uncontrolled   . Hypertension   . Coronary artery disease   . Myocardial infarct   . 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 PAD (peripheral artery disease) 05/01/2012  . Ischemic toe 05/01/2012  . Hyperlipidemia   . Prostate ca     s/p radioactive seed implant     Past Surgical History  Procedure Date  . Leg amputation below knee     right  . Coronary artery bypass graft   . Right patella removal   . Right finger fracture   . Closed reduction clavicle fracture      Family History  Problem Relation Age of Onset  . Heart attack Mother      History   Social History  . Marital Status: Divorced    Spouse Name: N/A    Number of Children: N/A  . Years of Education: N/A   Occupational History  . Not on file.   Social History Main Topics  . Smoking status: Light Tobacco Smoker  . Smokeless tobacco: Not on file  . Alcohol Use: Yes     Comment: occ  .  Drug Use: Yes    Special: Marijuana  . Sexually Active: Not on file   Other Topics Concern  . Not on file   Social History Narrative  . No narrative on file     ROS Constitutional: Negative for fever, chills, diaphoresis, activity change, appetite change and fatigue.  HENT: Negative for hearing loss, nosebleeds, congestion, sore throat, facial swelling, drooling, trouble swallowing, neck pain, voice change, sinus pressure and tinnitus.  Eyes: Negative for photophobia, pain, discharge and visual disturbance.  Respiratory: Negative for apnea, cough, chest tightness, shortness of breath and wheezing.  Cardiovascular: Negative for chest pain, palpitations and leg swelling.  Gastrointestinal: Negative for nausea, vomiting, abdominal pain, diarrhea, constipation, blood in stool and abdominal distention.  Genitourinary: Negative for dysuria, urgency, frequency, hematuria and decreased urine volume.  Skin: Negative for color change, pallor, rash and wound.  Neurological: Negative for dizziness, tremors, seizures, syncope, speech difficulty, weakness, light-headedness, numbness and headaches.  Psychiatric/Behavioral: Negative for suicidal ideas, hallucinations, behavioral problems and agitation. The patient is not nervous/anxious.     PHYSICAL EXAM   BP 160/80  Pulse 90 Constitutional: He is oriented to person, place, and time. He appears well-developed and well-nourished. No distress.  HENT: No nasal discharge.  Head: Normocephalic and atraumatic.  Eyes: Pupils are equal and round. Right eye exhibits no discharge. Left eye exhibits no discharge.  Neck: Normal range of motion. Neck supple. No JVD present. No thyromegaly present.  Cardiovascular: Normal rate, regular rhythm, normal heart sounds and. Exam reveals no gallop and no friction rub. No murmur heard.  Pulmonary/Chest: Effort normal and breath sounds normal. No stridor. No respiratory distress. He has no wheezes. He has no rales.  He exhibits no tenderness.  Abdominal: Soft. Bowel sounds are normal. He exhibits no distension. There is no tenderness. There is no rebound and no guarding.  Musculoskeletal: Normal range of motion. He exhibits no edema and no tenderness.  Neurological: He is alert and oriented to person, place, and time. Coordination normal.  Skin: Skin is warm and dry. No rash noted. He is not diaphoretic. No erythema. No pallor.  Psychiatric: He has a normal mood and affect. His behavior is normal. Judgment and thought content normal.  Vascular: Femoral pulses are diminished. Distal pulses in the left side are not palpable. Left foot is tender and mildly swollen. The first and third toes are dark and tender suggestive of early gangrenous changes.     ASSESSMENT AND PLAN

## 2012-05-29 NOTE — Assessment & Plan Note (Signed)
Patient has no symptoms suggestive of angina or heart failure. Baseline ECG showed no significant changes. Thus, he should be at an acceptable risk for general anesthesia. However, I will obtain an echocardiogram to evaluate LV systolic function before the procedure.

## 2012-05-29 NOTE — Assessment & Plan Note (Signed)
Philip Strong has critical limb ischemia involving the left foot which is progressing and getting worse with early gangrenous changes. This is a very tough case given the inability to approach this from the contralateral femoral artery. We cannot approach this from the left arm given that that the disease is too distal in the tibial artery. The only option is an antegrade left femoral artery approach which would carry more site complication especially with his obesity. Without revascularization, the patient will likely progress to gangrene and need of amputation. I had a prolonged discussion with him about the situation. I recommend proceeding with an attempted angioplasty of the left SFA and the left anterior tibial artery. Risks, benefits and alternatives were discussed with him. This will need to be done under general anesthesia due to reasons outlined above.

## 2012-05-30 ENCOUNTER — Encounter (HOSPITAL_COMMUNITY): Payer: Self-pay | Admitting: Pharmacy Technician

## 2012-06-04 ENCOUNTER — Ambulatory Visit (HOSPITAL_COMMUNITY): Payer: Medicare Other | Attending: Cardiovascular Disease

## 2012-06-04 DIAGNOSIS — E785 Hyperlipidemia, unspecified: Secondary | ICD-10-CM | POA: Insufficient documentation

## 2012-06-04 DIAGNOSIS — F17200 Nicotine dependence, unspecified, uncomplicated: Secondary | ICD-10-CM | POA: Insufficient documentation

## 2012-06-04 DIAGNOSIS — I739 Peripheral vascular disease, unspecified: Secondary | ICD-10-CM

## 2012-06-04 DIAGNOSIS — I10 Essential (primary) hypertension: Secondary | ICD-10-CM | POA: Insufficient documentation

## 2012-06-04 DIAGNOSIS — I059 Rheumatic mitral valve disease, unspecified: Secondary | ICD-10-CM | POA: Insufficient documentation

## 2012-06-04 DIAGNOSIS — Z01812 Encounter for preprocedural laboratory examination: Secondary | ICD-10-CM

## 2012-06-04 DIAGNOSIS — I2510 Atherosclerotic heart disease of native coronary artery without angina pectoris: Secondary | ICD-10-CM | POA: Insufficient documentation

## 2012-06-04 DIAGNOSIS — I079 Rheumatic tricuspid valve disease, unspecified: Secondary | ICD-10-CM | POA: Insufficient documentation

## 2012-06-04 DIAGNOSIS — E119 Type 2 diabetes mellitus without complications: Secondary | ICD-10-CM | POA: Insufficient documentation

## 2012-06-04 DIAGNOSIS — I252 Old myocardial infarction: Secondary | ICD-10-CM | POA: Insufficient documentation

## 2012-06-04 NOTE — Progress Notes (Signed)
Short stay said to put it in the regular orders and they will get it done.

## 2012-06-04 NOTE — Progress Notes (Signed)
Echocardiogram performed.  

## 2012-06-05 ENCOUNTER — Encounter (HOSPITAL_COMMUNITY): Payer: Self-pay | Admitting: Certified Registered Nurse Anesthetist

## 2012-06-05 ENCOUNTER — Encounter (HOSPITAL_COMMUNITY)
Admission: RE | Disposition: A | Discharge status: Home / Self Care | Payer: Self-pay | Source: Ambulatory Visit | Attending: Cardiovascular Disease

## 2012-06-05 ENCOUNTER — Ambulatory Visit (HOSPITAL_COMMUNITY)
Admission: RE | Admit: 2012-06-05 | Discharge: 2012-06-06 | Disposition: A | Discharge status: Home / Self Care | Payer: Medicare Other | Source: Ambulatory Visit | Attending: Cardiovascular Disease | Admitting: Cardiovascular Disease

## 2012-06-05 DIAGNOSIS — F17200 Nicotine dependence, unspecified, uncomplicated: Secondary | ICD-10-CM | POA: Insufficient documentation

## 2012-06-05 DIAGNOSIS — E785 Hyperlipidemia, unspecified: Secondary | ICD-10-CM | POA: Diagnosis present

## 2012-06-05 DIAGNOSIS — I2510 Atherosclerotic heart disease of native coronary artery without angina pectoris: Secondary | ICD-10-CM | POA: Diagnosis present

## 2012-06-05 DIAGNOSIS — Z8546 Personal history of malignant neoplasm of prostate: Secondary | ICD-10-CM | POA: Insufficient documentation

## 2012-06-05 DIAGNOSIS — I70269 Atherosclerosis of native arteries of extremities with gangrene, unspecified extremity: Secondary | ICD-10-CM

## 2012-06-05 DIAGNOSIS — I739 Peripheral vascular disease, unspecified: Secondary | ICD-10-CM | POA: Diagnosis present

## 2012-06-05 DIAGNOSIS — I998 Other disorder of circulatory system: Secondary | ICD-10-CM

## 2012-06-05 DIAGNOSIS — Z951 Presence of aortocoronary bypass graft: Secondary | ICD-10-CM | POA: Insufficient documentation

## 2012-06-05 DIAGNOSIS — IMO0001 Reserved for inherently not codable concepts without codable children: Secondary | ICD-10-CM | POA: Diagnosis present

## 2012-06-05 DIAGNOSIS — S88119A Complete traumatic amputation at level between knee and ankle, unspecified lower leg, initial encounter: Secondary | ICD-10-CM | POA: Insufficient documentation

## 2012-06-05 DIAGNOSIS — I10 Essential (primary) hypertension: Secondary | ICD-10-CM | POA: Diagnosis present

## 2012-06-05 DIAGNOSIS — D649 Anemia, unspecified: Secondary | ICD-10-CM | POA: Insufficient documentation

## 2012-06-05 DIAGNOSIS — I70229 Atherosclerosis of native arteries of extremities with rest pain, unspecified extremity: Principal | ICD-10-CM | POA: Diagnosis present

## 2012-06-05 DIAGNOSIS — Z794 Long term (current) use of insulin: Secondary | ICD-10-CM | POA: Insufficient documentation

## 2012-06-05 DIAGNOSIS — E119 Type 2 diabetes mellitus without complications: Secondary | ICD-10-CM | POA: Insufficient documentation

## 2012-06-05 HISTORY — PX: ANGIOPLASTY ILLIAC ARTERY: SHX5720

## 2012-06-05 LAB — POCT I-STAT, CHEM 8
Chloride: 106 mEq/L (ref 96–112)
Glucose, Bld: 123 mg/dL — ABNORMAL HIGH (ref 70–99)
HCT: 27 % — ABNORMAL LOW (ref 39.0–52.0)
Hemoglobin: 9.2 g/dL — ABNORMAL LOW (ref 13.0–17.0)
Potassium: 4.1 mEq/L (ref 3.5–5.1)
Sodium: 137 mEq/L (ref 135–145)

## 2012-06-05 LAB — POCT ACTIVATED CLOTTING TIME
Activated Clotting Time: 214 seconds
Activated Clotting Time: 247 seconds

## 2012-06-05 LAB — GLUCOSE, CAPILLARY
Glucose-Capillary: 116 mg/dL — ABNORMAL HIGH (ref 70–99)
Glucose-Capillary: 147 mg/dL — ABNORMAL HIGH (ref 70–99)
Glucose-Capillary: 353 mg/dL — ABNORMAL HIGH (ref 70–99)

## 2012-06-05 SURGERY — PTA PERIPHERAL ARTERY
Laterality: Left

## 2012-06-05 MED ORDER — SODIUM CHLORIDE 0.9 % IJ SOLN: 3.0000 mL | Freq: Two times a day (BID) | INTRAMUSCULAR | Status: DC

## 2012-06-05 MED ORDER — OXYCODONE HCL 5 MG/5ML PO SOLN: 5.0000 mg | Freq: Once | ORAL | Status: AC | PRN

## 2012-06-05 MED ORDER — LISINOPRIL 40 MG PO TABS
40.0000 mg | ORAL_TABLET | Freq: Every day | ORAL | Status: DC
  Administered 2012-06-06 (×2): 40 mg via ORAL
  Filled 2012-06-05 (×2): qty 1

## 2012-06-05 MED ORDER — HEPARIN SODIUM (PORCINE) 1000 UNIT/ML IJ SOLN
INTRAMUSCULAR | Status: AC
  Filled 2012-06-05: qty 1

## 2012-06-05 MED ORDER — ONDANSETRON HCL 4 MG/2ML IJ SOLN: 4.0000 mg | Freq: Four times a day (QID) | INTRAMUSCULAR | Status: DC | PRN

## 2012-06-05 MED ORDER — CARBOXYMETHYLCELLULOSE SODIUM 0.5 % OP SOLN: 1.0000 [drp] | Freq: Three times a day (TID) | OPHTHALMIC | Status: DC | PRN

## 2012-06-05 MED ORDER — SODIUM CHLORIDE 0.9 % IV SOLN
INTRAVENOUS | Status: DC
  Administered 2012-06-05: 15:00:00 via INTRAVENOUS

## 2012-06-05 MED ORDER — HYDROCODONE-ACETAMINOPHEN 5-325 MG PO TABS
1.0000 | ORAL_TABLET | ORAL | Status: DC | PRN
  Administered 2012-06-05 (×2): 1 via ORAL
  Filled 2012-06-05 (×2): qty 1

## 2012-06-05 MED ORDER — SODIUM CHLORIDE 0.9 % IV SOLN: 250.0000 mL | INTRAVENOUS | Status: DC | PRN

## 2012-06-05 MED ORDER — METOPROLOL SUCCINATE ER 100 MG PO TB24
100.0000 mg | ORAL_TABLET | Freq: Every day | ORAL | Status: DC
  Administered 2012-06-05 – 2012-06-06 (×2): 100 mg via ORAL
  Filled 2012-06-05 (×2): qty 1

## 2012-06-05 MED ORDER — INSULIN ASPART 100 UNIT/ML ~~LOC~~ SOLN
12.0000 [IU] | Freq: Once | SUBCUTANEOUS | Status: AC
  Administered 2012-06-05: 12 [IU] via SUBCUTANEOUS

## 2012-06-05 MED ORDER — FAMOTIDINE IN NACL 20-0.9 MG/50ML-% IV SOLN
20.0000 mg | Freq: Two times a day (BID) | INTRAVENOUS | Status: DC
  Administered 2012-06-05 – 2012-06-06 (×2): 20 mg via INTRAVENOUS
  Filled 2012-06-05 (×5): qty 50

## 2012-06-05 MED ORDER — LIDOCAINE HCL (PF) 1 % IJ SOLN
INTRAMUSCULAR | Status: AC
  Filled 2012-06-05: qty 30

## 2012-06-05 MED ORDER — INSULIN ASPART 100 UNIT/ML ~~LOC~~ SOLN
0.0000 [IU] | Freq: Three times a day (TID) | SUBCUTANEOUS | Status: DC
  Administered 2012-06-05: 15 [IU] via SUBCUTANEOUS
  Administered 2012-06-06: 09:00:00 5 [IU] via SUBCUTANEOUS

## 2012-06-05 MED ORDER — HEPARIN (PORCINE) IN NACL 2-0.9 UNIT/ML-% IJ SOLN
INTRAMUSCULAR | Status: AC
  Filled 2012-06-05: qty 1500

## 2012-06-05 MED ORDER — ASPIRIN 81 MG PO CHEW
324.0000 mg | CHEWABLE_TABLET | ORAL | Status: AC
  Administered 2012-06-05: 324 mg via ORAL
  Filled 2012-06-05: qty 4

## 2012-06-05 MED ORDER — SODIUM CHLORIDE 0.9 % IV SOLN: INTRAVENOUS | Status: DC

## 2012-06-05 MED ORDER — CLOPIDOGREL BISULFATE 75 MG PO TABS
75.0000 mg | ORAL_TABLET | Freq: Every day | ORAL | Status: DC
  Administered 2012-06-06: 75 mg via ORAL
  Filled 2012-06-05: qty 1

## 2012-06-05 MED ORDER — FERROUS SULFATE 325 (65 FE) MG PO TABS
325.0000 mg | ORAL_TABLET | Freq: Every day | ORAL | Status: DC
  Administered 2012-06-06: 09:00:00 325 mg via ORAL
  Filled 2012-06-05 (×2): qty 1

## 2012-06-05 MED ORDER — ASPIRIN 325 MG PO TABS
325.0000 mg | ORAL_TABLET | Freq: Every day | ORAL | Status: DC
  Administered 2012-06-06: 325 mg via ORAL
  Filled 2012-06-05 (×2): qty 1

## 2012-06-05 MED ORDER — AMLODIPINE BESYLATE 10 MG PO TABS
10.0000 mg | ORAL_TABLET | Freq: Every day | ORAL | Status: DC
  Administered 2012-06-06 (×2): 10 mg via ORAL
  Filled 2012-06-05 (×2): qty 1

## 2012-06-05 MED ORDER — ATORVASTATIN CALCIUM 20 MG PO TABS
20.0000 mg | ORAL_TABLET | Freq: Every day | ORAL | Status: DC
  Filled 2012-06-05 (×2): qty 1

## 2012-06-05 MED ORDER — SODIUM CHLORIDE 0.9 % IJ SOLN: 3.0000 mL | INTRAMUSCULAR | Status: DC | PRN

## 2012-06-05 MED ORDER — FUROSEMIDE 20 MG PO TABS
20.0000 mg | ORAL_TABLET | Freq: Every day | ORAL | Status: DC
  Filled 2012-06-05 (×2): qty 1

## 2012-06-05 MED ORDER — POLYVINYL ALCOHOL 1.4 % OP SOLN
1.0000 [drp] | Freq: Three times a day (TID) | OPHTHALMIC | Status: DC | PRN
  Filled 2012-06-05: qty 15

## 2012-06-05 MED ORDER — ACETAMINOPHEN 325 MG PO TABS: 650.0000 mg | ORAL_TABLET | ORAL | Status: DC | PRN

## 2012-06-05 MED ORDER — FENTANYL CITRATE 0.05 MG/ML IJ SOLN
25.0000 ug | INTRAMUSCULAR | Status: DC | PRN
Start: 2012-06-05 — End: 2012-06-06

## 2012-06-05 MED ORDER — INSULIN GLARGINE 100 UNIT/ML ~~LOC~~ SOLN
20.0000 [IU] | Freq: Every day | SUBCUTANEOUS | Status: DC
  Administered 2012-06-05: 23:00:00 20 [IU] via SUBCUTANEOUS

## 2012-06-05 MED ORDER — OXYCODONE HCL 5 MG PO TABS: 5.0000 mg | ORAL_TABLET | Freq: Once | ORAL | Status: AC | PRN

## 2012-06-05 NOTE — CV Procedure (Signed)
PERIPHERAL VASCULAR PROCEDURE  NAME:  Philip Strong   MRN: [id number] DOB:  1940/05/18   ADMIT DATE: 06/05/2012  Performing Cardiologist: Lorine Bears Primary Physician: [name], MD Primary Cardiologist:  Peter Swaziland M.D.  Procedures Performed:   Selective left lower extremity arterial angiography.  Left anterior tibial artery angioplasty.  Left TP trunk balloon angioplasty  Left SFA angioplasty  Left SFA self-expanding stent placement x2  Closure device placement.  All of the above was performed via an antegrade access of the left common femoral artery under general anesthesia.  Indication(s):    Critical Limb Ischemia with rest pain and early gangrenous changes involving the first and third toes. He failed medical therapy and symptoms worsened over the last 2 weeks. This was an extremely difficult case. During his previous diagnostic angiography, we could not sedate him and he was moving continuously which put him at significant risk of vascular injury. Thus, the only way to do this safely was under general anesthesia which was done. Also this could not have been approached from the right contralateral femoral artery due to significant tortuosity in the iliac arteries and steep angulation of the iliac bifurcation. During diagnostic angiography, I could not even advance a 4 [place] catheter easily. This was a very high-risk procedure given his morbid obesity and the need for antegrade stick. This did not have been approached via the arm given that the disease is all the way distally in the tibial artery and because of his high, our equipment was not long enough to reach.    Consent: The procedure with Risks/Benefits/Alternatives and Indications was reviewed with the patient  and family).  All questions were answered.  Medications:  Sedation:  This was performed by anesthesia. He was intubated and placed under general anesthesia.  Contrast:  150 mL Visipaque ,   Fluoroscopy time of 23 minutes.     Interventional Procedure:  The left groin was prepped in a sterile fashion. The femoral artery was identified via fluoroscopy landmarks and calcifications. An antegrade stick was performed with a micropuncture needle. The micropuncture wire was advanced but went into the profunda artery. The sheath was then placed in the profunda artery. I used another 018 wire. While pulling on the micropuncture sheath, I negotiated the second wire to go into the SFA. The first wire was then removed and the sheath was advanced into the SFA. The wire was exchanged into a woolly wire. I then tried to advance a 45 cm 6 French Pinnacle sheath but was not able to advance due to kinking. Thus, I used an endhole catheter and exchange the wire into a lunderquest heavy support wire. The sheath could not be advanced even with this. Thus, the tract was dilated the dilator. Then I was able to advance the sheath to the proximal SFA. Angiography was performed in the AP position which showed severe disease in the mid SFA with no flow beyond the anterior tibial proximal segment. 6000 units of unfractionated heparin was given and throughout the case another 4000 was given to maintain an ACT above 225. I then advanced along 018 CXR I angled catheter over a [place] core wire to engage the left anterior tibial artery. The mid left anterior tibial artery was ballooned with a 3.0 x 40 mm MG a score balloon x2. I noted that there was significant disease in the TP trunk which was also ballooned with the same MG a score balloon. Angiography year showed improvement in flow. However, given  the significant disease in the SFA we could not visualize distally very well. Thus, I proceeded with angioplasty of the SFA with a 4 mm long balloon. Angiography showed improvement in results with significant recoil and dissection in the SFA. However, this allowed me to be able to see the distal vessel. There was significant  residual stenosis in the mid anterior tibial artery. Thus, I used a 4.0 x 80 mm balloon which was inflated to 10 atmospheres with prolonged two-minute inflation. The same balloon was also used to dilate the TP trunk. Angiography here showed excellent results in the tibial and TP trunk. I proceeded with stenting of the left SFA after exchanging the wire into an 035 woolly wire. I placed a 6 x 1 50 mm Smart flex self-expanding stent. This was overlapped with another 6 x 60 mm Smart self-expanding stent. The whole segment was postdilated with a long 5 mm balloon. Final angiography showed excellent results with good flow all the way to the distal foot. Then the sheath was removed and exchanged into a short 6 [place] sheath. The site was closed with a Mynx  Device. The patient tolerated the procedure well with no immediate complications.   Conclusions: 1. Successful angioplasty of the left anterior tibial artery and the left TP trunk. 2. Successful angioplasty and 2 self-expanding stent placement her to the mid SFA with excellent results.  Recommendations:  The patient will be admitted overnight given that this was a complex procedure. He would need dual antiplatelet therapy for at least one month and preferably longer.   Lorine Bears, MD, [facility] 06/05/2012 2:35 PM

## 2012-06-05 NOTE — Progress Notes (Signed)
PER CATH LAB THEY WILL DRAW LABS IN CATH LAB HOLDING

## 2012-06-05 NOTE — Preoperative (Signed)
Beta Blockers   Reason not to administer Beta Blockers:Not Applicable 

## 2012-06-05 NOTE — H&P (View-Only) (Signed)
  HPI  This is a pleasant 71-year-old African American male is here today for a followup visit regarding peripheral arterial disease.  He has known history of insulin-dependent diabetes mellitus, hypertension, and tobacco use. He has a history of coronary disease with remote myocardial infarction. He underwent coronary bypass surgery apparently in 1999. No old records are available. 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 He later developed an ulceration of the stump which has been slow to heal. Angiogram at that time showed occluded right SFA. He denies chest pain, shortness of breath, or palpitations. Over the past 6 weeks he has developed severe pain in his left great toe. His pain is constant and sore to touch.  He underwent angiography which showed severe diffuse mid left SFA disease as well as one-vessel runoff below the knee with 99% stenosis in the anterior tibial artery which is the only artery supplying the foot. During the procedure, I could not fully sedate the patient and he kept moving which made the procedure somewhat risky. I did not proceed with intervention due to that reason. Also his aortic bifurcation prevented the contralateral approach. The pain in his left foot is getting worse with discoloration in the first and third toes which is getting worse. He is using oxycodone on a regular basis for pain control.  No Known Allergies   Current Outpatient Prescriptions on File Prior to Visit  Medication Sig Dispense Refill  . amLODipine (NORVASC) 10 MG tablet Take 10 mg by mouth daily.      . aspirin 325 MG tablet Take 325 mg by mouth daily.      . atorvastatin (LIPITOR) 20 MG tablet Take 1 tablet (20 mg total) by mouth daily.  30 tablet  6  . carboxymethylcellulose (REFRESH PLUS) 0.5 % SOLN Place 1 drop into both eyes 3 (three) times daily as needed. burning      . clopidogrel (PLAVIX) 75 MG tablet Take 1 tablet (75 mg total) by mouth daily.  90 tablet  1  . ferrous  sulfate 325 (65 FE) MG tablet Take 325 mg by mouth daily with breakfast.      . furosemide (LASIX) 20 MG tablet Take 20 mg by mouth daily.      . HYDROcodone-acetaminophen (NORCO/VICODIN) 5-325 MG per tablet Take 1 tablet by mouth every 6 (six) hours as needed.      . insulin NPH-insulin regular (NOVOLIN 70/30) (70-30) 100 UNIT/ML injection Inject 70 Units into the skin 2 (two) times daily with a meal.       . lisinopril (PRINIVIL,ZESTRIL) 40 MG tablet Take 40 mg by mouth daily.      . metoprolol succinate (TOPROL-XL) 100 MG 24 hr tablet Take 100 mg by mouth daily. Take with or immediately following a meal.      . oxyCODONE (ROXICODONE) 5 MG immediate release tablet Take 1 tablet (5 mg total) by mouth every 6 (six) hours as needed for pain.  10 tablet  0  . potassium chloride (K-DUR) 10 MEQ tablet Take 10 mEq by mouth daily.      . ranitidine (ZANTAC) 150 MG tablet Take 150 mg by mouth daily.         Past Medical History  Diagnosis Date  . Diabetes mellitus, insulin dependent (IDDM), uncontrolled   . Hypertension   . Coronary artery disease   . Myocardial infarct   . 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 PAD (peripheral artery disease) 05/01/2012  . Ischemic toe 05/01/2012  . Hyperlipidemia   . Prostate ca     s/p radioactive seed implant     Past Surgical History  Procedure Date  . Leg amputation below knee     right  . Coronary artery bypass graft   . Right patella removal   . Right finger fracture   . Closed reduction clavicle fracture      Family History  Problem Relation Age of Onset  . Heart attack Mother      History   Social History  . Marital Status: Divorced    Spouse Name: N/A    Number of Children: N/A  . Years of Education: N/A   Occupational History  . Not on file.   Social History Main Topics  . Smoking status: Light Tobacco Smoker  . Smokeless tobacco: Not on file  . Alcohol Use: Yes     Comment: occ  .  Drug Use: Yes    Special: Marijuana  . Sexually Active: Not on file   Other Topics Concern  . Not on file   Social History Narrative  . No narrative on file     ROS Constitutional: Negative for fever, chills, diaphoresis, activity change, appetite change and fatigue.  HENT: Negative for hearing loss, nosebleeds, congestion, sore throat, facial swelling, drooling, trouble swallowing, neck pain, voice change, sinus pressure and tinnitus.  Eyes: Negative for photophobia, pain, discharge and visual disturbance.  Respiratory: Negative for apnea, cough, chest tightness, shortness of breath and wheezing.  Cardiovascular: Negative for chest pain, palpitations and leg swelling.  Gastrointestinal: Negative for nausea, vomiting, abdominal pain, diarrhea, constipation, blood in stool and abdominal distention.  Genitourinary: Negative for dysuria, urgency, frequency, hematuria and decreased urine volume.  Skin: Negative for color change, pallor, rash and wound.  Neurological: Negative for dizziness, tremors, seizures, syncope, speech difficulty, weakness, light-headedness, numbness and headaches.  Psychiatric/Behavioral: Negative for suicidal ideas, hallucinations, behavioral problems and agitation. The patient is not nervous/anxious.     PHYSICAL EXAM   BP 160/80  Pulse 90 Constitutional: He is oriented to person, place, and time. He appears well-developed and well-nourished. No distress.  HENT: No nasal discharge.  Head: Normocephalic and atraumatic.  Eyes: Pupils are equal and round. Right eye exhibits no discharge. Left eye exhibits no discharge.  Neck: Normal range of motion. Neck supple. No JVD present. No thyromegaly present.  Cardiovascular: Normal rate, regular rhythm, normal heart sounds and. Exam reveals no gallop and no friction rub. No murmur heard.  Pulmonary/Chest: Effort normal and breath sounds normal. No stridor. No respiratory distress. He has no wheezes. He has no rales.  He exhibits no tenderness.  Abdominal: Soft. Bowel sounds are normal. He exhibits no distension. There is no tenderness. There is no rebound and no guarding.  Musculoskeletal: Normal range of motion. He exhibits no edema and no tenderness.  Neurological: He is alert and oriented to person, place, and time. Coordination normal.  Skin: Skin is warm and dry. No rash noted. He is not diaphoretic. No erythema. No pallor.  Psychiatric: He has a normal mood and affect. His behavior is normal. Judgment and thought content normal.  Vascular: Femoral pulses are diminished. Distal pulses in the left side are not palpable. Left foot is tender and mildly swollen. The first and third toes are dark and tender suggestive of early gangrenous changes.     ASSESSMENT AND PLAN   

## 2012-06-05 NOTE — Progress Notes (Signed)
Orthopedic Tech Progress Note Patient Details:  Philip Strong 1940-09-24 [id number]  Ortho Devices Type of Ortho Device: Knee Immobilizer Ortho Device/Splint Location: left leg Ortho Device/Splint Interventions: Application   Nikki Dom 06/05/2012, 2:47 PM

## 2012-06-05 NOTE — Anesthesia Preprocedure Evaluation (Deleted)
Anesthesia Evaluation  Patient identified by MRN, date of birth, ID band Patient awake    Reviewed: Allergy & Precautions, H&P , NPO status , Patient's Chart, lab work & pertinent test results, reviewed documented beta blocker date and time   History of Anesthesia Complications Negative for: history of anesthetic complications  Airway Mallampati: III TM Distance: >3 FB Neck ROM: Full    Dental  (+) Teeth Intact and Dental Advisory Given   Pulmonary Current Smoker,          Cardiovascular hypertension, Pt. on medications and Pt. on home beta blockers + CAD, + Past MI, + CABG and + Peripheral Vascular Disease Rhythm:Regular Rate:Normal     Neuro/Psych negative neurological ROS  negative psych ROS   GI/Hepatic negative GI ROS, Neg liver ROS,   Endo/Other  diabetes, Well Controlled, Type 2, Insulin DependentMorbid obesity  Renal/GU negative Renal ROS  negative genitourinary   Musculoskeletal negative musculoskeletal ROS (+)   Abdominal Normal abdominal exam  (+)   Peds negative pediatric ROS (+)  Hematology negative hematology ROS (+)   Anesthesia Other Findings   Reproductive/Obstetrics                          Anesthesia Physical Anesthesia Plan  ASA: III  Anesthesia Plan: General   Post-op Pain Management:    Induction: Intravenous  Airway Management Planned: LMA  Additional Equipment:   Intra-op Plan:   Post-operative Plan: Extubation in OR  Informed Consent: I have reviewed the patients History and Physical, chart, labs and discussed the procedure including the risks, benefits and alternatives for the proposed anesthesia with the patient or authorized representative who has indicated his/her understanding and acceptance.   Dental advisory given  Plan Discussed with: CRNA, Anesthesiologist and Surgeon  Anesthesia Plan Comments:         Anesthesia Quick Evaluation

## 2012-06-05 NOTE — Interval H&P Note (Signed)
History and Physical Interval Note:  06/05/2012 11:57 AM  Philip Strong  has presented today for surgery, with the diagnosis of Claudication  The various methods of treatment have been discussed with the patient and family. After consideration of risks, benefits and other options for treatment, the patient has consented to  Procedure(s): LOWER EXTREMITY ANGIOGRAM (N/A) as a surgical intervention .  The patient's history has been reviewed, patient examined, no change in status, stable for surgery.  I have reviewed the patient's chart and labs.  Questions were answered to the patient's satisfaction.     Lorine Bears

## 2012-06-06 ENCOUNTER — Encounter (HOSPITAL_COMMUNITY): Payer: Self-pay | Admitting: Physician Assistant

## 2012-06-06 DIAGNOSIS — E1065 Type 1 diabetes mellitus with hyperglycemia: Secondary | ICD-10-CM

## 2012-06-06 DIAGNOSIS — I999 Unspecified disorder of circulatory system: Secondary | ICD-10-CM

## 2012-06-06 DIAGNOSIS — I70229 Atherosclerosis of native arteries of extremities with rest pain, unspecified extremity: Secondary | ICD-10-CM | POA: Diagnosis present

## 2012-06-06 DIAGNOSIS — I998 Other disorder of circulatory system: Secondary | ICD-10-CM | POA: Diagnosis present

## 2012-06-06 DIAGNOSIS — I739 Peripheral vascular disease, unspecified: Secondary | ICD-10-CM

## 2012-06-06 LAB — CBC WITH DIFFERENTIAL/PLATELET
HCT: 26.6 % — ABNORMAL LOW (ref 39.0–52.0)
Hemoglobin: 8.5 g/dL — ABNORMAL LOW (ref 13.0–17.0)
Lymphocytes Relative: 22 % (ref 12–46)
Lymphs Abs: 2.2 10*3/uL (ref 0.7–4.0)
Monocytes Absolute: 0.8 10*3/uL (ref 0.1–1.0)
Monocytes Relative: 8 % (ref 3–12)
Neutro Abs: 6.9 10*3/uL (ref 1.7–7.7)
Neutrophils Relative %: 69 % (ref 43–77)
RBC: 3.79 MIL/uL — ABNORMAL LOW (ref 4.22–5.81)
WBC: 10 10*3/uL (ref 4.0–10.5)

## 2012-06-06 LAB — BASIC METABOLIC PANEL
GFR calc non Af Amer: 66 mL/min — ABNORMAL LOW (ref >90)
Glucose, Bld: 197 mg/dL — ABNORMAL HIGH (ref 70–99)
Potassium: 3.7 mEq/L (ref 3.5–5.1)
Sodium: 137 mEq/L (ref 135–145)

## 2012-06-06 MED ORDER — GUAIFENESIN-DM 100-10 MG/5ML PO SYRP
5.0000 mL | ORAL_SOLUTION | ORAL | Status: DC | PRN
  Administered 2012-06-06: 02:00:00 5 mL via ORAL
  Filled 2012-06-06: qty 5

## 2012-06-06 NOTE — Discharge Summary (Signed)
Patient seen and examined and history reviewed. Agree with above findings and plan. See earlier rounding note.   Philip Strong 06/06/2012 9:54 AM

## 2012-06-06 NOTE — Discharge Summary (Signed)
Discharge Summary   Patient ID: Philip Strong MRN: [id number], DOB/AGE: [birth year] 72 y.o. Admit date: 06/05/2012 D/C date:     06/06/2012  Primary PV: Arida   Primary Discharge Diagnoses:  1. 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 Anemia, appears chronic 3. IDDM 4. HTN 5. Tobacco abuse  Secondary Discharge Diagnoses:  1. CAD with remote MI, CABG 1999  2. Prostate CA s/p radioactive seed placement  Hospital Course: Philip Strong is a 72 y/o M With history of IDDM, HTN, tobacco abuse, CAD, and PAD. He had a right BKA for a gangrenous right foot in 2007. He later developed an ulceration of the stump which has been slow to heal. Angiogram at that time showed occluded right SFA. Over the past 3 weeks he has developed severe pain in his left great toe, constant and sore to touch. He has no history of TIA or stroke. Doppler US showed evidence of inflow disease worse on the right side. The proximal SFA was occluded on the right side. Left SFA was diffusely diseased. ABI on the left side was 0.35 with no waveforms to obtain toe pressure. He was felt to have critical limb ischemia with early gangrenous 1st and 3rd toes. He underwent diagnostic PV angio 05/15/12, and was brought back yesterday for intervention. During his previous diagnostic angiography, they could not sedate him and he was moving continuously which put him at significant risk of vascular injury. Thus, the only way to do this safely was under general anesthesia which was done. He subsequently underwent: Selective left lower extremity arterial angiography.  Left anterior tibial artery angioplasty.  Left TP trunk balloon angioplasty  Left SFA angioplasty  Left SFA  self-expanding stent placement x2  Closure device placement.  DAPT was recommended for at least one month, preferably longer. Today he is feeling well. Hgb is 8.5 today but this is felt related to blood loss from his procedure, with baseline Hgb around 9.4. He will be instructed to discuss further monitoring with PCP. Dr. Swaziland has seen and examined him today and feels he is stable for discharge.   Discharge Vitals: Blood pressure 161/68, pulse 70, temperature 97.7 F (36.5 C), temperature source Oral, resp. rate 23, height 6\' 3"  (1.905 m), weight 303 lb 2.1 oz (137.5 kg), SpO2 95.00%. Pt to receive AM BP meds prior to discharge Labs: Lab Results  Component Value Date   WBC 10.0 06/06/2012   HGB 8.5* 06/06/2012   HCT 26.6* 06/06/2012   MCV 70.2* 06/06/2012   PLT 303 06/06/2012     Recent Labs Lab 06/06/12 0545  NA 137  K 3.7  CL 103  CO2 23  BUN 15  CREATININE 1.09  CALCIUM 9.0  GLUCOSE 197*    Lab Results  Component Value Date   CHOL 150 05/01/2012   HDL 17.50* 05/01/2012   LDLCALC 104* 05/01/2012   TRIG 145.0 05/01/2012    Diagnostic Studies/Procedures   1. PV angio this admission, please see full report and above for summary.   Discharge Medications     Medication List    TAKE these medications       amLODipine 10 MG tablet  Commonly known as:  NORVASC  Take 10 mg  by mouth daily.     aspirin 325 MG tablet  Take 325 mg by mouth daily.     atorvastatin 20 MG tablet  Commonly known as:  LIPITOR  Take 1 tablet (20 mg total) by mouth daily.     carboxymethylcellulose 0.5 % Soln  Commonly known as:  REFRESH PLUS  Place 1 drop into both eyes 3 (three) times daily as needed. Redness and burning     clopidogrel 75 MG tablet  Commonly known as:  PLAVIX  Take 1 tablet (75 mg total) by mouth daily.     ferrous sulfate 325 (65 FE) MG tablet  Take 325 mg by mouth daily with breakfast.     furosemide 20 MG tablet  Commonly known as:  LASIX  Take 20 mg by mouth daily.      HYDROcodone-acetaminophen 5-325 MG per tablet  Commonly known as:  NORCO/VICODIN  Take 1 tablet by mouth every 6 (six) hours as needed. For pain     insulin NPH-insulin regular (70-30) 100 UNIT/ML injection  Commonly known as:  NOVOLIN 70/30  Inject 70 Units into the skin 2 (two) times daily with a meal.     lisinopril 40 MG tablet  Commonly known as:  PRINIVIL,ZESTRIL  Take 40 mg by mouth daily.     metoprolol succinate 100 MG 24 hr tablet  Commonly known as:  TOPROL-XL  Take 100 mg by mouth daily. Take with or immediately following a meal.     oxyCODONE 5 MG immediate release tablet  Commonly known as:  ROXICODONE  Take 1 tablet (5 mg total) by mouth every 6 (six) hours as needed for pain.     potassium chloride 10 MEQ tablet  Commonly known as:  K-DUR  Take 10 mEq by mouth daily.     ranitidine 150 MG tablet  Commonly known as:  ZANTAC  Take 150 mg by mouth daily.        Disposition   The patient will be discharged in stable condition to home. Discharge Orders   Future Appointments Provider Department Dept Phone   08/06/2012 7:30 AM [facility] [employer] [facility] [place]) [phone]   Future Orders Complete By Expires     Diet - low sodium heart healthy  As directed     Comments:      Diabetic Diet    Increase activity slowly  As directed     Comments:      No driving for 2 days (if you were not previously driving, disregard this). No lifting over 5 lbs for 1 week. No sexual activity for 1 week. Keep procedure site clean & dry. If you notice increased pain, swelling, bleeding or pus, call/return!  You may shower, but no soaking baths/hot tubs/pools for 1 week.      Follow-up Information   Follow up with Lorine Bears, MD. (Our office will call you for an appointment)    Contact information:   [address] [place] [address] [phone]      Follow up with [name], MD. (Your blood counts show that you are  anemic (decreased blood count). Please follow up with primary care doctor for evaluation.)    Contact information:   [address] [facility] [place] [address] [phone]         Duration of Discharge Encounter: Greater than 30 minutes including physician and PA time.  Signed, Ronie Spies PA-C 06/06/2012, 8:27 AM

## 2012-06-06 NOTE — Progress Notes (Signed)
   TELEMETRY: Reviewed telemetry pt in NSR: Filed Vitals:   July 02, 2012 2018 06/06/12 0010 06/06/12 0203 06/06/12 0539  BP:  133/45  161/68  Pulse:  76  70  Temp: 97.2 F (36.2 C) 97.3 F (36.3 C)  97.8 F (36.6 C)  TempSrc: Oral Oral  Oral  Resp:  18  23  Height:      Weight:   303 lb 2.1 oz (137.5 kg)   SpO2: 100% 100%  95%    Intake/Output Summary (Last 24 hours) at 06/06/12 0751 Last data filed at 06/06/12 0500  Gross per 24 hour  Intake    850 ml  Output   1900 ml  Net  -1050 ml    SUBJECTIVE Patient feels well. Still has toe pain but thinks its better. No SOB or chest pain.  LABS: Basic Metabolic Panel:  Recent Labs  95/62/13 1302 06/06/12 0545  NA 137 137  K 4.1 3.7  CL 106 103  CO2  --  23  GLUCOSE 123* 197*  BUN 19 15  CREATININE 1.40* 1.09  CALCIUM  --  9.0   CBC:  Recent Labs  07/02/12 1302 06/06/12 0545  WBC  --  10.0  NEUTROABS  --  6.9  HGB 9.2* 8.5*  HCT 27.0* 26.6*  MCV  --  70.2*  PLT  --  303   Radiology/Studies:  No results found.  Ecg:2012/07/02 10:20:09 [facility] ROUTINE RECORD Normal sinus rhythm with sinus arrhythmia Minimal voltage criteria for LVH, may be normal variant T wave abnormality, consider lateral ischemia Prolonged QT  PHYSICAL EXAM General: Well developed,obese, in no acute distress. Head: Normocephalic, atraumatic, sclera non-icteric, no xanthomas, nares are without discharge. Neck: Negative for carotid bruits. JVD not elevated. Lungs: Clear bilaterally to auscultation without wheezes, rales, or rhonchi. Breathing is unlabored. Heart: RRR S1 S2 without murmurs, rubs, or gallops.  Abdomen: Soft, non-tender, non-distended with normoactive bowel sounds. No hepatomegaly. No rebound/guarding. No obvious abdominal masses. Msk:  Strength and tone appears normal for age. Extremities: No clubbing, cyanosis or edema.  Right BKA. Nonpalpable pulse left foot. First and third toe are dark and tender.  Small necrotic area dorsum of foot. Neuro: Alert and oriented X 3. Moves all extremities spontaneously. Psych:  Responds to questions appropriately with a normal affect.  ASSESSMENT AND PLAN: 1. PAD with critical limb ischemia of the left foot. S/p PTA of left tibial and left TP trunk. S/p stent of left SFA. Continue ASA and plavix. OK for discharge today with follow up with Dr. Kirke Corin. 2. IDDM continue home insulin dose. 3. HTN 4. S/p right BKA 5. CAD with remote CABG 6. Anemia baseline Hgb 9.4. Now 8.5 probably with blood loss from long procedure.   Principal Problem:   Critical lower limb ischemia Active Problems:   PAD (peripheral artery disease)   Coronary artery disease   Hyperlipidemia   Diabetes mellitus, insulin dependent (IDDM), uncontrolled   Hypertension    Signed, Mario Voong [place] MD,FACC 06/06/2012 7:51 AM

## 2012-06-06 NOTE — Progress Notes (Signed)
Inpatient Diabetes Program Recommendations  AACE/ADA: New Consensus Statement on Inpatient Glycemic Control (2013)  Target Ranges:  Prepandial:   less than 140 mg/dL      Peak postprandial:   less than 180 mg/dL (1-2 hours)      Critically ill patients:  140 - 180 mg/dL   Reason for Visit: Results for Philip Strong, Philip Strong (MRN [id number]) as of 06/06/2012 10:36  Ref. Range 06/05/2012 15:04 06/05/2012 18:01 06/05/2012 21:56 06/06/2012 08:02  Glucose-Capillary Latest Range: 70-99 mg/dL 409 (H) 811 (H) 914 (H) 223 (H)   Note history of diabetes.  Patient takes 70/30 insulin 70 units bid at home.  Note patient likely to discharge home today.  If he stays in the hospital, consider discontinuation of Lantus and add 70/30 insulin-35 units bid (with breakfast and supper).

## 2012-06-10 ENCOUNTER — Other Ambulatory Visit: Payer: Self-pay

## 2012-06-10 DIAGNOSIS — I739 Peripheral vascular disease, unspecified: Secondary | ICD-10-CM

## 2012-06-13 ENCOUNTER — Telehealth: Payer: Self-pay | Admitting: Cardiovascular Disease

## 2012-06-13 NOTE — Telephone Encounter (Signed)
Pt complaining of a bruised and painful hand at site of iv.  I told him it may be sore and bruised for a while.  He is to call back if it gets worse instead of better.  He agrees.

## 2012-06-13 NOTE — Telephone Encounter (Signed)
Pt had an IV put in at hospital in his hand and it hurt and per pt it has his hand all messed up

## 2012-06-18 ENCOUNTER — Encounter (INDEPENDENT_AMBULATORY_CARE_PROVIDER_SITE_OTHER): Payer: Medicare Other

## 2012-06-26 ENCOUNTER — Encounter: Payer: Self-pay | Admitting: Cardiovascular Disease

## 2012-06-26 ENCOUNTER — Ambulatory Visit (INDEPENDENT_AMBULATORY_CARE_PROVIDER_SITE_OTHER): Payer: Medicare Other | Admitting: Cardiovascular Disease

## 2012-06-26 VITALS — BP 132/68 | HR 66 | Ht 75.0 in | Wt 303.0 lb

## 2012-06-26 DIAGNOSIS — I10 Essential (primary) hypertension: Secondary | ICD-10-CM

## 2012-06-26 MED ORDER — CLOPIDOGREL BISULFATE 75 MG PO TABS: 75.0000 mg | ORAL_TABLET | Freq: Every day | ORAL | Status: DC

## 2012-06-26 NOTE — Patient Instructions (Addendum)
Your physician has requested that you have a lower or upper extremity arterial duplex in 6 months. This test is an ultrasound of the arteries in the legs or arms. It looks at arterial blood flow in the legs and arms. Allow one hour for Lower and Upper Arterial scans. There are no restrictions or special instructions  Your physician wants you to follow-up in: 6 months, one week after your duplex.  You will receive a reminder letter in the mail two months in advance. If you don't receive a letter, please call our office to schedule the follow-up appointment.  Your physician recommends that you continue on your current medications as directed. Please refer to the Current Medication list given to you today.  Marland Kitchen

## 2012-06-27 ENCOUNTER — Encounter: Payer: Self-pay | Admitting: Cardiovascular Disease

## 2012-06-27 NOTE — Progress Notes (Signed)
HPI  This is a 72 year old African American male is here today for a followup visit regarding peripheral arterial disease with critical limb ischemia.  He has known history of insulin-dependent diabetes mellitus, hypertension, and tobacco use. He has a history of coronary disease with remote myocardial infarction. He underwent coronary bypass surgery apparently in 1999. No old records are available. 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 He later developed an ulceration of the stump which has been slow to heal. Angiogram at that time showed occluded right SFA. He denies chest pain, shortness of breath, or palpitations.  He was seen recently for rest pain involving the left leg with toe ulceration and early gangrenous changes. He underwent angiography which showed severe diffuse mid left SFA disease as well as one-vessel runoff below the knee with 99% stenosis in the anterior tibial artery which is the only artery supplying the foot.  He underwent a staged endovascular intervention on the left lower extremity under general anesthesia due to inability to safely sedate him during his diagnostic angiography. The procedure was done via an antegrade access of the left femoral artery which was extremely difficult due to his obesity. I was able to perform balloon angioplasty on the left anterior tibial and tibioperoneal trunk. The left SFA was diffusely diseased and I placed 2 self-expanding stents. Since then, he reports complete resolution of his resting. The ulcer is on the toe are healing nicely.  No Known Allergies   Current Outpatient Prescriptions on File Prior to Visit  Medication Sig Dispense Refill  . amLODipine (NORVASC) 10 MG tablet Take 10 mg by mouth daily.      Marland Kitchen aspirin 325 MG tablet Take 325 mg by mouth daily.      Marland Kitchen atorvastatin (LIPITOR) 20 MG tablet Take 1 tablet (20 mg total) by mouth daily.  30 tablet  6  . carboxymethylcellulose (REFRESH PLUS) 0.5 % SOLN Place 1  drop into both eyes 3 (three) times daily as needed. Redness and burning      . ferrous sulfate 325 (65 FE) MG tablet Take 325 mg by mouth daily with breakfast.      . furosemide (LASIX) 20 MG tablet Take 20 mg by mouth daily.      Marland Kitchen HYDROcodone-acetaminophen (NORCO/VICODIN) 5-325 MG per tablet Take 1 tablet by mouth every 6 (six) hours as needed. For pain      . insulin NPH-insulin regular (NOVOLIN 70/30) (70-30) 100 UNIT/ML injection Inject 70 Units into the skin 2 (two) times daily with a meal.       . lisinopril (PRINIVIL,ZESTRIL) 40 MG tablet Take 40 mg by mouth daily.      . metoprolol succinate (TOPROL-XL) 100 MG 24 hr tablet Take 100 mg by mouth daily. Take with or immediately following a meal.      . oxyCODONE (ROXICODONE) 5 MG immediate release tablet Take 1 tablet (5 mg total) by mouth every 6 (six) hours as needed for pain.  10 tablet  0  . potassium chloride (K-DUR) 10 MEQ tablet Take 10 mEq by mouth daily.      . ranitidine (ZANTAC) 150 MG tablet Take 150 mg by mouth daily.       No current facility-administered medications on file prior to visit.     Past Medical History  Diagnosis Date  . Diabetes mellitus, insulin dependent (IDDM), uncontrolled   . Hypertension   . Coronary artery disease     a. Remote MI. b.  CABG 1999.  Marland Kitchen Myocardial infarct   . 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 PAD (peripheral artery disease) 05/01/2012    a. 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 b. s/p extensive LLE angioplasty and stent placement x 2 to the SFA and angioplasty of anterior tibial and tibioperoneal trunck in 05/2012.  . Ischemic toe   . Hyperlipidemia   . Prostate ca     s/p radioactive seed implant     Past Surgical History  Procedure Laterality Date  . Leg amputation below knee      right  . Coronary artery bypass graft    . Right patella removal    . Right finger fracture    . Closed reduction clavicle fracture    . Angioplasty illiac  artery  06/05/2012     Family History  Problem Relation Age of Onset  . Heart attack Mother      History   Social History  . Marital Status: Divorced    Spouse Name: N/A    Number of Children: N/A  . Years of Education: N/A   Occupational History  . Not on file.   Social History Main Topics  . Smoking status: Former Smoker    Quit date: 05/23/2012  . Smokeless tobacco: Not on file  . Alcohol Use: Yes     Comment: occ  . Drug Use: Yes    Special: Marijuana  . Sexually Active: Not on file   Other Topics Concern  . Not on file   Social History Narrative  . No narrative on file      PHYSICAL EXAM   BP 132/68  Pulse 66  Ht 6\' 3"  (1.905 m)  Wt 303 lb (137.44 kg)  BMI 37.87 kg/m2  SpO2 98% Constitutional: He is oriented to person, place, and time. He appears well-developed and well-nourished. No distress.  HENT: No nasal discharge.  Head: Normocephalic and atraumatic.  Eyes: Pupils are equal and round. Right eye exhibits no discharge. Left eye exhibits no discharge.  Neck: Normal range of motion. Neck supple. No JVD present. No thyromegaly present.  Cardiovascular: Normal rate, regular rhythm, normal heart sounds and. Exam reveals no gallop and no friction rub. No murmur heard.  Pulmonary/Chest: Effort normal and breath sounds normal. No stridor. No respiratory distress. He has no wheezes. He has no rales. He exhibits no tenderness.  Abdominal: Soft. Bowel sounds are normal. He exhibits no distension. There is no tenderness. There is no rebound and no guarding.  Musculoskeletal: Normal range of motion. He exhibits no edema and no tenderness.  Neurological: He is alert and oriented to person, place, and time. Coordination normal.  Skin: Skin is warm and dry. No rash noted. He is not diaphoretic. No erythema. No pallor.  Psychiatric: He has a normal mood and affect. His behavior is normal. Judgment and thought content normal.  Vascular: Femoral pulses are  diminished. Left dorsalis pedis pulse is palpable. Left foot is warm. The first and third toes are no longer dark and tender . There is dry ulceration at the tip of the first toe which seems to be healing     ASSESSMENT AND PLAN

## 2012-06-27 NOTE — Assessment & Plan Note (Signed)
He has no symptoms of angina. Recent echocardiogram showed normal LV systolic function.

## 2012-06-27 NOTE — Assessment & Plan Note (Signed)
Blood pressure is well controlled 

## 2012-06-27 NOTE — Assessment & Plan Note (Signed)
Patient is doing very well after her recent complex intervention on the left lower extremity for critical limb ischemia. He has palpable dorsalis pedis pulse. He no longer has discomfort and ulceration healing. Postprocedure ABI was normal on the left side with significant improvement in toe pressure.  I recommend continuing dual antiplatelet therapy with aspirin and Plavix. Followup lower extremity arterial duplex in 6 months from now. Continue treatment with atorvastatin.

## 2012-08-06 ENCOUNTER — Other Ambulatory Visit: Payer: Medicare Other

## 2012-08-07 ENCOUNTER — Other Ambulatory Visit (INDEPENDENT_AMBULATORY_CARE_PROVIDER_SITE_OTHER): Payer: Medicare Other

## 2012-08-07 DIAGNOSIS — E785 Hyperlipidemia, unspecified: Secondary | ICD-10-CM

## 2012-08-07 LAB — LIPID PANEL
Cholesterol: 161 mg/dL (ref 0–200)
HDL: 28 mg/dL — ABNORMAL LOW (ref >39.00)
VLDL: 18 mg/dL (ref 0.0–40.0)

## 2012-08-07 LAB — HEPATIC FUNCTION PANEL
ALT: 12 U/L (ref 0–53)
Total Protein: 7.9 g/dL (ref 6.0–8.3)

## 2012-08-08 ENCOUNTER — Other Ambulatory Visit: Payer: Self-pay

## 2012-08-08 DIAGNOSIS — E785 Hyperlipidemia, unspecified: Secondary | ICD-10-CM

## 2012-08-08 MED ORDER — ATORVASTATIN CALCIUM 80 MG PO TABS: 80.0000 mg | ORAL_TABLET | Freq: Every day | ORAL | Status: DC

## 2012-08-20 ENCOUNTER — Ambulatory Visit (INDEPENDENT_AMBULATORY_CARE_PROVIDER_SITE_OTHER): Payer: Medicare Other | Admitting: Cardiovascular Disease

## 2012-08-20 ENCOUNTER — Encounter: Payer: Self-pay | Admitting: Cardiovascular Disease

## 2012-08-20 VITALS — BP 103/54 | HR 69 | Ht 75.0 in

## 2012-08-20 DIAGNOSIS — E785 Hyperlipidemia, unspecified: Secondary | ICD-10-CM

## 2012-08-20 DIAGNOSIS — I2510 Atherosclerotic heart disease of native coronary artery without angina pectoris: Secondary | ICD-10-CM

## 2012-08-20 DIAGNOSIS — I739 Peripheral vascular disease, unspecified: Secondary | ICD-10-CM

## 2012-08-20 MED ORDER — OXYCODONE HCL 5 MG PO TABS: 5.0000 mg | ORAL_TABLET | Freq: Four times a day (QID) | ORAL | Status: DC | PRN

## 2012-08-20 NOTE — Assessment & Plan Note (Signed)
Lab Results  Component Value Date   CHOL 161 08/07/2012   HDL 28.00* 08/07/2012   LDLCALC 115* 08/07/2012   TRIG 90.0 08/07/2012   CHOLHDL 6 08/07/2012   Continue high dose atorvastatin.

## 2012-08-20 NOTE — Progress Notes (Signed)
HPI  This is a 72 year old African American male is here today for a followup visit regarding peripheral arterial disease with critical limb ischemia.  He has known history of insulin-dependent diabetes mellitus, hypertension, and tobacco use. He has a history of coronary disease with remote myocardial infarction. He underwent coronary bypass surgery apparently in 1999.  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 He later developed an ulceration of the stump which has been slow to heal. Angiogram at that time showed occluded right SFA.  He was seen  in January of this year rest pain involving the left leg with toe ulceration and early gangrenous changes. He underwent angiography which showed severe diffuse mid left SFA disease as well as one-vessel runoff below the knee with 99% stenosis in the anterior tibial artery which was the only artery supplying the foot.  He underwent a staged endovascular intervention on the left lower extremity under general anesthesia due to inability to safely sedate him during his diagnostic angiography. The procedure was done via an antegrade access of the left femoral artery which was extremely difficult due to his obesity. I was able to perform balloon angioplasty on the left anterior tibial and tibioperoneal trunk. The left SFA was diffusely diseased and I placed 2 self-expanding stents. He denies any discomfort involving the left leg. However, he is here for discomfort at the right leg stump. 2 weeks ago, he tried to put a prosthetic leg on. He walked on it for a whole day but gave him significant discomfort. When he took it off he had severe pain at the area with swelling. There has been no ulceration or discharge. No change in color.  No Known Allergies   Current Outpatient Prescriptions on File Prior to Visit  Medication Sig Dispense Refill  . amLODipine (NORVASC) 10 MG tablet Take 10 mg by mouth daily.      Marland Kitchen aspirin 325 MG tablet Take 325 mg by  mouth daily.      Marland Kitchen atorvastatin (LIPITOR) 80 MG tablet Take 1 tablet (80 mg total) by mouth daily.  30 tablet  6  . carboxymethylcellulose (REFRESH PLUS) 0.5 % SOLN Place 1 drop into both eyes 3 (three) times daily as needed. Redness and burning      . clopidogrel (PLAVIX) 75 MG tablet Take 1 tablet (75 mg total) by mouth daily.  30 tablet  6  . ferrous sulfate 325 (65 FE) MG tablet Take 325 mg by mouth daily with breakfast.      . furosemide (LASIX) 20 MG tablet Take 20 mg by mouth daily.      Marland Kitchen HYDROcodone-acetaminophen (NORCO/VICODIN) 5-325 MG per tablet Take 1 tablet by mouth every 6 (six) hours as needed. For pain      . insulin NPH-insulin regular (NOVOLIN 70/30) (70-30) 100 UNIT/ML injection Inject 70 Units into the skin 2 (two) times daily with a meal.       . lisinopril (PRINIVIL,ZESTRIL) 40 MG tablet Take 40 mg by mouth daily.      . metoprolol succinate (TOPROL-XL) 100 MG 24 hr tablet Take 100 mg by mouth daily. Take with or immediately following a meal.      . potassium chloride (K-DUR) 10 MEQ tablet Take 10 mEq by mouth daily.      . ranitidine (ZANTAC) 150 MG tablet Take 150 mg by mouth daily.       No current facility-administered medications on file prior to visit.  Past Medical History  Diagnosis Date  . Diabetes mellitus, insulin dependent (IDDM), uncontrolled   . Hypertension   . Coronary artery disease     a. Remote MI. b. CABG 1999.  Marland Kitchen Myocardial infarct   . 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 PAD (peripheral artery disease) 05/01/2012    a. 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 b. s/p extensive LLE angioplasty and stent placement x 2 to the SFA and angioplasty of anterior tibial and tibioperoneal trunck in 05/2012.  . Ischemic toe   . Hyperlipidemia   . Prostate ca     s/p radioactive seed implant     Past Surgical History  Procedure Laterality Date  . Leg amputation below knee      right  . Coronary artery bypass graft     . Right patella removal    . Right finger fracture    . Closed reduction clavicle fracture    . Angioplasty illiac artery  06/05/2012     Family History  Problem Relation Age of Onset  . Heart attack Mother      History   Social History  . Marital Status: Divorced    Spouse Name: N/A    Number of Children: N/A  . Years of Education: N/A   Occupational History  . Not on file.   Social History Main Topics  . Smoking status: Former Smoker    Quit date: 05/23/2012  . Smokeless tobacco: Not on file  . Alcohol Use: Yes     Comment: occ  . Drug Use: Yes    Special: Marijuana  . Sexually Active: Not on file   Other Topics Concern  . Not on file   Social History Narrative  . No narrative on file      PHYSICAL EXAM   BP 103/54  Pulse 69  Ht 6\' 3"  (1.905 m)  SpO2 99% Constitutional: He is oriented to person, place, and time. He appears well-developed and well-nourished. No distress.  HENT: No nasal discharge.  Head: Normocephalic and atraumatic.  Eyes: Pupils are equal and round. Right eye exhibits no discharge. Left eye exhibits no discharge.  Neck: Normal range of motion. Neck supple. No JVD present. No thyromegaly present.  Cardiovascular: Normal rate, regular rhythm, normal heart sounds and. Exam reveals no gallop and no friction rub. No murmur heard.  Pulmonary/Chest: Effort normal and breath sounds normal. No stridor. No respiratory distress. He has no wheezes. He has no rales. He exhibits no tenderness.  Abdominal: Soft. Bowel sounds are normal. He exhibits no distension. There is no tenderness. There is no rebound and no guarding.  Musculoskeletal: Normal range of motion. He exhibits no edema and no tenderness.  Neurological: He is alert and oriented to person, place, and time. Coordination normal.  Skin: Skin is warm and dry. No rash noted. He is not diaphoretic. No erythema. No pallor.  Psychiatric: He has a normal mood and affect. His behavior is  normal. Judgment and thought content normal.  Vascular: Femoral pulses are diminished. Left dorsalis pedis pulse is very faint. Left foot is warm. No ulceration. The right leg stump is intact with no discharge. There seems to be a small fluid collection with no warmth or erythema to suggest infection.   ASSESSMENT AND PLAN

## 2012-08-20 NOTE — Patient Instructions (Addendum)
Your physician recommends that you continue on your current medications as directed. Please refer to the Current Medication list given to you today.  Your physician recommends that you maintain all scheduled follow-up appointments.

## 2012-08-20 NOTE — Assessment & Plan Note (Signed)
The left leg seems to be fine with no complaints of claudication. His main issue seems to be discomfort at the right leg stump post amputation in 2007. This happened after he put significant pressure on it with a prosthetic leg which did not seem to fit  very well. I advised him to rest the area. I provided him with short-term oxycodone. I asked him to notify us in 2 weeks if there is no improvement. In that situation, he might need to be referred to surgery. Once he has recovered from this, he might need to be referred to have a different prosthetic leg fitted in.

## 2012-08-20 NOTE — Assessment & Plan Note (Signed)
He denies any symptoms of angina at this time.

## 2012-11-02 ENCOUNTER — Other Ambulatory Visit: Payer: Self-pay | Admitting: Cardiovascular Disease

## 2012-11-06 ENCOUNTER — Encounter (HOSPITAL_COMMUNITY): Payer: Self-pay | Admitting: Emergency Medicine

## 2012-11-06 ENCOUNTER — Emergency Department (HOSPITAL_COMMUNITY)
Admission: EM | Admit: 2012-11-06 | Discharge: 2012-11-07 | Disposition: A | Discharge status: Home / Self Care | Payer: Medicare Other | Attending: Emergency Medicine | Admitting: Emergency Medicine

## 2012-11-06 DIAGNOSIS — I2510 Atherosclerotic heart disease of native coronary artery without angina pectoris: Secondary | ICD-10-CM | POA: Insufficient documentation

## 2012-11-06 DIAGNOSIS — D649 Anemia, unspecified: Secondary | ICD-10-CM

## 2012-11-06 DIAGNOSIS — I10 Essential (primary) hypertension: Secondary | ICD-10-CM | POA: Insufficient documentation

## 2012-11-06 DIAGNOSIS — Z794 Long term (current) use of insulin: Secondary | ICD-10-CM | POA: Insufficient documentation

## 2012-11-06 DIAGNOSIS — E785 Hyperlipidemia, unspecified: Secondary | ICD-10-CM | POA: Insufficient documentation

## 2012-11-06 DIAGNOSIS — Z87891 Personal history of nicotine dependence: Secondary | ICD-10-CM | POA: Insufficient documentation

## 2012-11-06 DIAGNOSIS — Z8546 Personal history of malignant neoplasm of prostate: Secondary | ICD-10-CM | POA: Insufficient documentation

## 2012-11-06 DIAGNOSIS — I252 Old myocardial infarction: Secondary | ICD-10-CM | POA: Insufficient documentation

## 2012-11-06 DIAGNOSIS — Z8679 Personal history of other diseases of the circulatory system: Secondary | ICD-10-CM | POA: Insufficient documentation

## 2012-11-06 DIAGNOSIS — Z951 Presence of aortocoronary bypass graft: Secondary | ICD-10-CM | POA: Insufficient documentation

## 2012-11-06 DIAGNOSIS — Z79899 Other long term (current) drug therapy: Secondary | ICD-10-CM | POA: Insufficient documentation

## 2012-11-06 DIAGNOSIS — Z8739 Personal history of other diseases of the musculoskeletal system and connective tissue: Secondary | ICD-10-CM | POA: Insufficient documentation

## 2012-11-06 DIAGNOSIS — E1169 Type 2 diabetes mellitus with other specified complication: Secondary | ICD-10-CM | POA: Insufficient documentation

## 2012-11-06 DIAGNOSIS — R51 Headache: Secondary | ICD-10-CM | POA: Insufficient documentation

## 2012-11-06 DIAGNOSIS — Z7982 Long term (current) use of aspirin: Secondary | ICD-10-CM | POA: Insufficient documentation

## 2012-11-06 DIAGNOSIS — R739 Hyperglycemia, unspecified: Secondary | ICD-10-CM

## 2012-11-06 LAB — CBC WITH DIFFERENTIAL/PLATELET
Eosinophils Relative: 0 % (ref 0–5)
Monocytes Absolute: 0.7 10*3/uL (ref 0.1–1.0)
Monocytes Relative: 5 % (ref 3–12)
Neutrophils Relative %: 81 % — ABNORMAL HIGH (ref 43–77)
Platelets: 422 10*3/uL — ABNORMAL HIGH (ref 150–400)
RBC: 3.76 MIL/uL — ABNORMAL LOW (ref 4.22–5.81)
WBC: 14.9 10*3/uL — ABNORMAL HIGH (ref 4.0–10.5)

## 2012-11-06 LAB — POCT I-STAT, CHEM 8
Glucose, Bld: 244 mg/dL — ABNORMAL HIGH (ref 70–99)
HCT: 26 % — ABNORMAL LOW (ref 39.0–52.0)
Hemoglobin: 8.8 g/dL — ABNORMAL LOW (ref 13.0–17.0)
Potassium: 4 mEq/L (ref 3.5–5.1)
Sodium: 136 mEq/L (ref 135–145)

## 2012-11-06 NOTE — ED Notes (Signed)
Patient with sore throat, body aches, swollen bilateral wrists.  Patient states that this has been going on for one week.

## 2012-11-07 ENCOUNTER — Emergency Department (HOSPITAL_COMMUNITY): Payer: Medicare Other

## 2012-11-07 ENCOUNTER — Encounter (HOSPITAL_COMMUNITY): Payer: Self-pay | Admitting: Radiology

## 2012-11-07 LAB — URINALYSIS, ROUTINE W REFLEX MICROSCOPIC
Bilirubin Urine: NEGATIVE
Nitrite: NEGATIVE
Protein, ur: 100 mg/dL — AB
Specific Gravity, Urine: 1.021 (ref 1.005–1.030)
Urobilinogen, UA: 1 mg/dL (ref 0.0–1.0)

## 2012-11-07 LAB — URINE MICROSCOPIC-ADD ON

## 2012-11-07 LAB — TYPE AND SCREEN: Antibody Screen: NEGATIVE

## 2012-11-07 MED ORDER — FENTANYL CITRATE 0.05 MG/ML IJ SOLN
50.0000 ug | Freq: Once | INTRAMUSCULAR | Status: AC
  Administered 2012-11-07: 50 ug via INTRAVENOUS
  Filled 2012-11-07: qty 2

## 2012-11-07 MED ORDER — LEVOFLOXACIN 750 MG PO TABS
750.0000 mg | ORAL_TABLET | Freq: Every day | ORAL | Status: DC
  Administered 2012-11-07: 750 mg via ORAL
  Filled 2012-11-07 (×2): qty 1

## 2012-11-07 MED ORDER — FENTANYL CITRATE 0.05 MG/ML IJ SOLN
50.0000 ug | INTRAMUSCULAR | Status: DC | PRN
  Administered 2012-11-07: 50 ug via INTRAVENOUS
  Filled 2012-11-07: qty 2

## 2012-11-07 NOTE — ED Notes (Signed)
Pt states that the main reason he came to the ER, was because he ate something and thought his blood sugar was high because he started having moderate pain in his head and teeth. Pt says the pain has decreased but is still present.

## 2012-11-07 NOTE — ED Provider Notes (Signed)
History    CSN: [id number] Arrival date & time 11/06/12  2130  First MD Initiated Contact with Patient 11/06/12 2331     Chief Complaint  Patient presents with  . Generalized Body Aches   (Consider location/radiation/quality/duration/timing/severity/associated sxs/prior Treatment) HPI History provided by patient. H/o diabetes presenting with multiple complaints. He's been having some mild headache with body aches and swollen left wrist ongoing for the last week. Brought in by family tonight to be evaluated because his blood sugar at home was greater than 600. He took his insulin as prescribed and was found to have a blood sugar here of 254. He attributes his wrist swelling to using his walker more than normal. He has a right BKA. He denies any fall. No fevers. No rash or increased warmth to touch over this area.  Headache is located all over, sharp in quality and comes and goes. He has no associated nausea vomiting, neck pain or stiffness, or rash. He denies any chest pain or difficulty breathing. Symptoms moderate in severity.   Past Medical History  Diagnosis Date  . Diabetes mellitus, insulin dependent (IDDM), uncontrolled   . Hypertension   . Coronary artery disease     a. Remote MI. b. CABG 1999.  Marland Kitchen Myocardial infarct   . 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 PAD (peripheral artery disease) 05/01/2012    a. 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 b. s/p extensive LLE angioplasty and stent placement x 2 to the SFA and angioplasty of anterior tibial and tibioperoneal trunck in 05/2012.  . Ischemic toe   . Hyperlipidemia   . Prostate ca     s/p radioactive seed implant   Past Surgical History  Procedure Laterality Date  . Leg amputation below knee      right  . Coronary artery bypass graft    . Right patella removal    . Right finger fracture    . Closed reduction clavicle fracture    . Angioplasty illiac artery  06/05/2012   Family History   Problem Relation Age of Onset  . Heart attack Mother    History  Substance Use Topics  . Smoking status: Former Smoker    Quit date: 05/23/2012  . Smokeless tobacco: Not on file  . Alcohol Use: Yes     Comment: occ    Review of Systems  Constitutional: Negative for fever and chills.  HENT: Negative for neck pain and neck stiffness.   Eyes: Negative for visual disturbance.  Respiratory: Negative for shortness of breath.   Cardiovascular: Negative for chest pain.  Gastrointestinal: Negative for vomiting and abdominal pain.  Genitourinary: Negative for dysuria.  Musculoskeletal: Negative for back pain.  Skin: Negative for rash.  Neurological: Positive for headaches. Negative for dizziness and seizures.  All other systems reviewed and are negative.    Allergies  Review of patient's allergies indicates no known allergies.  Home Medications   Current Outpatient Rx  Name  Route  Sig  Dispense  Refill  . amLODipine (NORVASC) 10 MG tablet   Oral   Take 10 mg by mouth daily.         Marland Kitchen aspirin 325 MG tablet   Oral   Take 325 mg by mouth daily.         Marland Kitchen atorvastatin (LIPITOR) 80 MG tablet   Oral   Take 1 tablet (80 mg total) by mouth daily.   30 tablet  6   . carboxymethylcellulose (REFRESH PLUS) 0.5 % SOLN   Both Eyes   Place 1 drop into both eyes 3 (three) times daily as needed. Redness and burning         . clopidogrel (PLAVIX) 75 MG tablet   Oral   Take 1 tablet (75 mg total) by mouth daily.   30 tablet   6   . ferrous sulfate 325 (65 FE) MG tablet   Oral   Take 325 mg by mouth daily with breakfast.         . furosemide (LASIX) 20 MG tablet   Oral   Take 20 mg by mouth daily.         . insulin NPH-insulin regular (NOVOLIN 70/30) (70-30) 100 UNIT/ML injection   Subcutaneous   Inject 75 Units into the skin 2 (two) times daily with a meal.          . lisinopril (PRINIVIL,ZESTRIL) 40 MG tablet   Oral   Take 40 mg by mouth daily.          . metoprolol succinate (TOPROL-XL) 100 MG 24 hr tablet   Oral   Take 100 mg by mouth daily. Take with or immediately following a meal.         . potassium chloride (K-DUR) 10 MEQ tablet   Oral   Take 10 mEq by mouth daily.         . ranitidine (ZANTAC) 150 MG tablet   Oral   Take 150 mg by mouth daily.          BP 141/54  Pulse 76  Temp(Src) 97.8 F (36.6 C) (Rectal)  Resp 20  SpO2 98% Physical Exam  Constitutional: He is oriented to person, place, and time. He appears well-developed and well-nourished.  HENT:  Head: Normocephalic and atraumatic.  Mouth/Throat: Oropharynx is clear and moist. No oropharyngeal exudate.  Eyes: EOM are normal. Pupils are equal, round, and reactive to light.  Neck: Normal range of motion. Neck supple.  No meningismus  Cardiovascular: Normal rate, regular rhythm and intact distal pulses.   Pulmonary/Chest: Effort normal and breath sounds normal. No stridor. No respiratory distress. He exhibits no tenderness.  Abdominal: Soft. Bowel sounds are normal. He exhibits no distension.  Genitourinary:  Rectal exam: Nontender, no fissures, brown stool  Musculoskeletal: Normal range of motion.  Left more so than right mild swelling over the wrists. Full range of motion throughout without any erythema, tenderness or increased warmth to touch. Distal neurovascular intact. Right sided BKA. Left lower extremity without edema or tenderness  Lymphadenopathy:    He has no cervical adenopathy.  Neurological: He is alert and oriented to person, place, and time. No cranial nerve deficit. Coordination normal.  Skin: Skin is warm and dry.    ED Course  Procedures (including critical care time) Labs Reviewed  CBC WITH DIFFERENTIAL - Abnormal; Notable for the following:    WBC 14.9 (*)    RBC 3.76 (*)    Hemoglobin 8.1 (*)    HCT 24.4 (*)    MCV 64.9 (*)    MCH 21.5 (*)    RDW 18.5 (*)    Platelets 422 (*)    Neutrophils Relative % 81 (*)    Neutro Abs  12.1 (*)    All other components within normal limits  GLUCOSE, CAPILLARY - Abnormal; Notable for the following:    Glucose-Capillary 254 (*)    All other components within normal limits  URINALYSIS, ROUTINE W  REFLEX MICROSCOPIC - Abnormal; Notable for the following:    Glucose, UA 250 (*)    Hgb urine dipstick SMALL (*)    Protein, ur 100 (*)    All other components within normal limits  URINE MICROSCOPIC-ADD ON - Abnormal; Notable for the following:    Casts HYALINE CASTS (*)    All other components within normal limits  POCT I-STAT, CHEM 8 - Abnormal; Notable for the following:    Glucose, Bld 244 (*)    Hemoglobin 8.8 (*)    HCT 26.0 (*)    All other components within normal limits  OCCULT BLOOD, POC DEVICE  TYPE AND SCREEN   Dg Chest 2 View  11/07/2012   *RADIOLOGY REPORT*  Clinical Data: Bodyaches and shortness of breath.  CHEST - 2 VIEW  Comparison: 11/11/2008.  Findings: Cardiomegaly, similar to prior.  Status post CABG.  No change in upper mediastinal contours.  Enlarged vascular structures centrally.   Coarsened interstitial markings.  No focal consolidation, edema, effusion, pneumothorax. No significant osseous abnormality.  IMPRESSION:  Cardiomegaly and pulmonary venous congestion.   Original Report Authenticated By: Tiburcio Pea   Ct Head Wo Contrast  11/07/2012   *RADIOLOGY REPORT*  Clinical Data: Hyperglycemia; moderate headache.  CT HEAD WITHOUT CONTRAST  Technique:  Contiguous axial images were obtained from the base of the skull through the vertex without contrast.  Comparison: CT of the head performed 02/18/2004  Findings: There is no evidence of acute infarction, mass lesion, or intra- or extra-axial hemorrhage on CT.  The posterior fossa, including the cerebellum, brainstem and fourth ventricle, is within normal limits.  The third and lateral ventricles, and basal ganglia are unremarkable in appearance.  The cerebral hemispheres are symmetric in appearance, with normal  gray- white differentiation.  No mass effect or midline shift is seen.  There is no evidence of fracture; visualized osseous structures are unremarkable in appearance.  The orbits are within normal limits. Mucosal thickening is noted at the left maxillary sinus, sphenoid sinus and ethmoid air cells.  The remaining paranasal sinuses and mastoid air cells are well-aerated.  No significant soft tissue abnormalities are seen.  IMPRESSION:  1.  No acute intracranial pathology seen on CT. 2.  Mucosal thickening at the left maxillary sinus, sphenoid sinus and ethmoid air cells.   Original Report Authenticated By: Tonia Ghent, M.D.   IV fluids. IV fentanyl.   4:23 AM on recheck patient states he is feeling completely better, has no symptoms and would like to be discharged home. He is requesting a cup of coffee. He agrees to followup with his physician at the [facility]. He agrees to strict return precautions. No indication for admit at this time.  MDM  Diabetic with hyperglycemia at home, improved with insulin prior to arrival. Evaluated with imaging and labs reviewed as above. No significant hyperglycemia. Anemic with Hemoccult negative stool. No syncope, dizziness or symptomatic anemia. Patient agrees to followup with his primary care physician regarding this. Improved with IV fluids and narcotics. Vital signs and nursing notes reviewed and considered.    Sunnie Nielsen, MD 11/07/12 [id number]

## 2012-11-08 ENCOUNTER — Other Ambulatory Visit: Payer: Medicare Other

## 2012-11-21 ENCOUNTER — Inpatient Hospital Stay (HOSPITAL_COMMUNITY)
Admission: EM | Admit: 2012-11-21 | Discharge: 2012-11-30 | DRG: 252 | Disposition: A | Discharge status: Home / Self Care | Payer: Medicare Other | Attending: Internal Medicine | Admitting: Internal Medicine

## 2012-11-21 ENCOUNTER — Encounter (HOSPITAL_COMMUNITY): Payer: Self-pay | Admitting: Radiology

## 2012-11-21 ENCOUNTER — Emergency Department (HOSPITAL_COMMUNITY): Payer: Medicare Other

## 2012-11-21 DIAGNOSIS — I70269 Atherosclerosis of native arteries of extremities with gangrene, unspecified extremity: Secondary | ICD-10-CM | POA: Diagnosis present

## 2012-11-21 DIAGNOSIS — I214 Non-ST elevation (NSTEMI) myocardial infarction: Principal | ICD-10-CM | POA: Diagnosis present

## 2012-11-21 DIAGNOSIS — Y831 Surgical operation with implant of artificial internal device as the cause of abnormal reaction of the patient, or of later complication, without mention of misadventure at the time of the procedure: Secondary | ICD-10-CM | POA: Diagnosis present

## 2012-11-21 DIAGNOSIS — E669 Obesity, unspecified: Secondary | ICD-10-CM | POA: Diagnosis present

## 2012-11-21 DIAGNOSIS — I739 Peripheral vascular disease, unspecified: Secondary | ICD-10-CM

## 2012-11-21 DIAGNOSIS — D638 Anemia in other chronic diseases classified elsewhere: Secondary | ICD-10-CM | POA: Diagnosis present

## 2012-11-21 DIAGNOSIS — I509 Heart failure, unspecified: Secondary | ICD-10-CM

## 2012-11-21 DIAGNOSIS — I2510 Atherosclerotic heart disease of native coronary artery without angina pectoris: Secondary | ICD-10-CM | POA: Diagnosis present

## 2012-11-21 DIAGNOSIS — I4891 Unspecified atrial fibrillation: Secondary | ICD-10-CM

## 2012-11-21 DIAGNOSIS — I5033 Acute on chronic diastolic (congestive) heart failure: Secondary | ICD-10-CM

## 2012-11-21 DIAGNOSIS — I5043 Acute on chronic combined systolic (congestive) and diastolic (congestive) heart failure: Secondary | ICD-10-CM | POA: Diagnosis present

## 2012-11-21 DIAGNOSIS — R7989 Other specified abnormal findings of blood chemistry: Secondary | ICD-10-CM

## 2012-11-21 DIAGNOSIS — C61 Malignant neoplasm of prostate: Secondary | ICD-10-CM | POA: Diagnosis present

## 2012-11-21 DIAGNOSIS — Z9861 Coronary angioplasty status: Secondary | ICD-10-CM

## 2012-11-21 DIAGNOSIS — Z6835 Body mass index (BMI) 35.0-35.9, adult: Secondary | ICD-10-CM

## 2012-11-21 DIAGNOSIS — N179 Acute kidney failure, unspecified: Secondary | ICD-10-CM | POA: Diagnosis present

## 2012-11-21 DIAGNOSIS — Z951 Presence of aortocoronary bypass graft: Secondary | ICD-10-CM

## 2012-11-21 DIAGNOSIS — F1210 Cannabis abuse, uncomplicated: Secondary | ICD-10-CM | POA: Diagnosis present

## 2012-11-21 DIAGNOSIS — F17200 Nicotine dependence, unspecified, uncomplicated: Secondary | ICD-10-CM

## 2012-11-21 DIAGNOSIS — T82898A Other specified complication of vascular prosthetic devices, implants and grafts, initial encounter: Secondary | ICD-10-CM | POA: Diagnosis present

## 2012-11-21 DIAGNOSIS — E1065 Type 1 diabetes mellitus with hyperglycemia: Secondary | ICD-10-CM

## 2012-11-21 DIAGNOSIS — I10 Essential (primary) hypertension: Secondary | ICD-10-CM | POA: Diagnosis present

## 2012-11-21 DIAGNOSIS — E785 Hyperlipidemia, unspecified: Secondary | ICD-10-CM | POA: Diagnosis present

## 2012-11-21 DIAGNOSIS — Z794 Long term (current) use of insulin: Secondary | ICD-10-CM

## 2012-11-21 DIAGNOSIS — D649 Anemia, unspecified: Secondary | ICD-10-CM

## 2012-11-21 DIAGNOSIS — D62 Acute posthemorrhagic anemia: Secondary | ICD-10-CM | POA: Diagnosis present

## 2012-11-21 DIAGNOSIS — L97509 Non-pressure chronic ulcer of other part of unspecified foot with unspecified severity: Secondary | ICD-10-CM | POA: Diagnosis present

## 2012-11-21 DIAGNOSIS — Z87891 Personal history of nicotine dependence: Secondary | ICD-10-CM

## 2012-11-21 DIAGNOSIS — K922 Gastrointestinal hemorrhage, unspecified: Secondary | ICD-10-CM | POA: Diagnosis present

## 2012-11-21 DIAGNOSIS — S88119A Complete traumatic amputation at level between knee and ankle, unspecified lower leg, initial encounter: Secondary | ICD-10-CM

## 2012-11-21 DIAGNOSIS — I5023 Acute on chronic systolic (congestive) heart failure: Secondary | ICD-10-CM

## 2012-11-21 DIAGNOSIS — Z79899 Other long term (current) drug therapy: Secondary | ICD-10-CM

## 2012-11-21 DIAGNOSIS — I998 Other disorder of circulatory system: Secondary | ICD-10-CM

## 2012-11-21 HISTORY — DX: Nonspecific elevation of levels of transaminase and lactic acid dehydrogenase (ldh): R74.0

## 2012-11-21 HISTORY — DX: Unspecified atrial fibrillation: I48.91

## 2012-11-21 HISTORY — DX: Heart failure, unspecified: I50.9

## 2012-11-21 HISTORY — DX: Elevation of levels of liver transaminase levels: R74.01

## 2012-11-21 LAB — CBC WITH DIFFERENTIAL/PLATELET
Basophils Absolute: 0 10*3/uL (ref 0.0–0.1)
Eosinophils Relative: 0 % (ref 0–5)
Lymphocytes Relative: 17 % (ref 12–46)
MCV: 63.8 fL — ABNORMAL LOW (ref 78.0–100.0)
Monocytes Relative: 6 % (ref 3–12)
Neutrophils Relative %: 77 % (ref 43–77)
Platelets: 450 10*3/uL — ABNORMAL HIGH (ref 150–400)
RBC: 3.34 MIL/uL — ABNORMAL LOW (ref 4.22–5.81)
RDW: 18.2 % — ABNORMAL HIGH (ref 11.5–15.5)
Smear Review: ADEQUATE
WBC: 13.4 10*3/uL — ABNORMAL HIGH (ref 4.0–10.5)

## 2012-11-21 LAB — POCT I-STAT, CHEM 8
Creatinine, Ser: 1.7 mg/dL — ABNORMAL HIGH (ref 0.50–1.35)
HCT: 24 % — ABNORMAL LOW (ref 39.0–52.0)
Hemoglobin: 8.2 g/dL — ABNORMAL LOW (ref 13.0–17.0)
Potassium: 4.1 mEq/L (ref 3.5–5.1)
Sodium: 140 mEq/L (ref 135–145)
TCO2: 18 mmol/L (ref 0–100)

## 2012-11-21 LAB — POCT I-STAT TROPONIN I

## 2012-11-21 LAB — D-DIMER, QUANTITATIVE: D-Dimer, Quant: 2.83 ug/mL-FEU — ABNORMAL HIGH (ref 0.00–0.48)

## 2012-11-21 MED ORDER — FUROSEMIDE 10 MG/ML IJ SOLN
40.0000 mg | Freq: Once | INTRAMUSCULAR | Status: AC
  Administered 2012-11-21: 40 mg via INTRAVENOUS
  Filled 2012-11-21: qty 4

## 2012-11-21 MED ORDER — SODIUM CHLORIDE 0.9 % IV BOLUS (SEPSIS)
1000.0000 mL | Freq: Once | INTRAVENOUS | Status: AC
  Administered 2012-11-21: 1000 mL via INTRAVENOUS

## 2012-11-21 MED ORDER — IOHEXOL 350 MG/ML SOLN
80.0000 mL | Freq: Once | INTRAVENOUS | Status: AC | PRN
  Administered 2012-11-21: 80 mL via INTRAVENOUS

## 2012-11-21 MED ORDER — HYDROMORPHONE HCL PF 1 MG/ML IJ SOLN
1.0000 mg | Freq: Once | INTRAMUSCULAR | Status: AC
  Administered 2012-11-21: 1 mg via INTRAVENOUS
  Filled 2012-11-21: qty 1

## 2012-11-21 MED ORDER — ASPIRIN 81 MG PO CHEW
324.0000 mg | CHEWABLE_TABLET | Freq: Once | ORAL | Status: AC
  Administered 2012-11-21: 324 mg via ORAL
  Filled 2012-11-21: qty 4

## 2012-11-21 NOTE — Consult Note (Signed)
PULMONARY  / CRITICAL CARE MEDICINE  Name: Philip Strong MRN: [id number] DOB: Jan 22, 1941    ADMISSION DATE:  11/21/2012 CONSULTATION DATE:  11/21/2012  REFERRING MD :  Ethelda Chick PRIMARY SERVICE: EDP  CHIEF COMPLAINT:  Shortness of breath  BRIEF PATIENT DESCRIPTION: 72 y/o male with DM2, CAD was admitted on 7/31 from the [facility] ED with three days of shortness of breath from a CHF exacerbation.  Found to be anemic.  SIGNIFICANT EVENTS / STUDIES:  7/31 CT A Chest >> no PE, bilateral pulm edema and effusions, slightly enlarged mediastinal lymphadenopathy 7/31 CT A Abdomen>> no aortic dissection, atherosclerosis throughout abdominal vessels  LINES / TUBES:   CULTURES:   ANTIBIOTICS:   HISTORY OF PRESENT ILLNESS:  72 y/o male with DM2, CAD was admitted on 7/31 from the [facility] ED with three days of shortness of breath from a CHF exacerbation.  Found to be anemic. He stated that for three days prior to admission he had noted progressive dyspnea with chest tightness and left ankle swelling.  He denied fever, chills or cough.  He stated that his chest pain was mostly pressure, a sensation that he could not take a deep breath. In the ED he was found to have a slightly elevated troponin and no new changes on an EKG.  PCCM was consulted because of multiple medical problems (anemia, troponin, pulm edema).  [name] denied bleeding of any kind and stated that he had known about his anemia for years.    PAST MEDICAL HISTORY :  Past Medical History  Diagnosis Date  . Diabetes mellitus, insulin dependent (IDDM), uncontrolled   . Hypertension   . Coronary artery disease     a. Remote MI. b. CABG 1999.  Marland Kitchen Myocardial infarct   . 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 PAD (peripheral artery disease) 05/01/2012    a. 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 b. s/p extensive LLE angioplasty and stent placement x 2 to the SFA and angioplasty of anterior tibial and tibioperoneal  trunck in 05/2012.  . Ischemic toe   . Hyperlipidemia   . Prostate ca     s/p radioactive seed implant  . CHF (congestive heart failure)    Past Surgical History  Procedure Laterality Date  . Leg amputation below knee      right  . Coronary artery bypass graft    . Right patella removal    . Right finger fracture    . Closed reduction clavicle fracture    . Angioplasty illiac artery  06/05/2012   Prior to Admission medications   Medication Sig Start Date End Date Taking? Authorizing Provider  acetaminophen (TYLENOL) 325 MG tablet Take 325 mg by mouth daily as needed for pain. For pain   Yes Historical Provider, MD  amLODipine (NORVASC) 10 MG tablet Take 10 mg by mouth daily.   Yes Historical Provider, MD  amoxicillin (AMOXIL) 500 MG capsule Take 500 mg by mouth 2 (two) times daily.   Yes Historical Provider, MD  aspirin 325 MG tablet Take 325 mg by mouth daily.   Yes Historical Provider, MD  atorvastatin (LIPITOR) 80 MG tablet Take 1 tablet (80 mg total) by mouth daily. 08/08/12  Yes Peter M [place], MD  clopidogrel (PLAVIX) 75 MG tablet Take 1 tablet (75 mg total) by mouth daily. 06/26/12  Yes Iran Ouch, MD  ferrous sulfate 325 (65 FE) MG tablet Take 325 mg by mouth daily  with breakfast.   Yes Historical Provider, MD  furosemide (LASIX) 20 MG tablet Take 20 mg by mouth every morning.    Yes Historical Provider, MD  HYDROcodone-acetaminophen (NORCO/VICODIN) 5-325 MG per tablet Take 1 tablet by mouth every 8 (eight) hours as needed for pain. For pain   Yes Historical Provider, MD  insulin NPH-insulin regular (NOVOLIN 70/30) (70-30) 100 UNIT/ML injection Inject 75 Units into the skin 2 (two) times daily with a meal.    Yes Historical Provider, MD  lisinopril (PRINIVIL,ZESTRIL) 40 MG tablet Take 40 mg by mouth daily.   Yes Historical Provider, MD  metoprolol succinate (TOPROL-XL) 100 MG 24 hr tablet Take 100 mg by mouth daily. Take with or immediately following a meal.   Yes Historical  Provider, MD  oxyCODONE-acetaminophen (PERCOCET) 10-325 MG per tablet Take 1 tablet by mouth 2 (two) times daily as needed for pain.   Yes Historical Provider, MD  potassium chloride (K-DUR) 10 MEQ tablet Take 10 mEq by mouth daily.   Yes Historical Provider, MD  prednisoLONE acetate (PRED FORTE) 1 % ophthalmic suspension Place 1 drop into both eyes 2 (two) times daily as needed. For inflammation/eye irritation   Yes Historical Provider, MD  ranitidine (ZANTAC) 150 MG tablet Take 150 mg by mouth daily.   Yes Historical Provider, MD   No Known Allergies  FAMILY HISTORY:  Family History  Problem Relation Age of Onset  . Heart attack Mother    SOCIAL HISTORY:  reports that he quit smoking about 5 months ago. He does not have any smokeless tobacco history on file. He reports that  drinks alcohol. He reports that he uses illicit drugs (Marijuana).  REVIEW OF SYSTEMS:   Gen: Denies fever, chills, weight change, fatigue, night sweats HEENT: Denies blurred vision, double vision, hearing loss, tinnitus, sinus congestion, rhinorrhea, sore throat, neck stiffness, dysphagia PULM: per HPI CV: Notes chest tightness, ankle edema, denies orthopnea, paroxysmal nocturnal dyspnea, palpitations GI: Denies abdominal pain, nausea, vomiting, diarrhea, hematochezia, melena, constipation, change in bowel habits GU: Denies dysuria, hematuria, polyuria, oliguria, urethral discharge Endocrine: Denies hot or cold intolerance, polyuria, polyphagia or appetite change Derm: Denies rash, dry skin, scaling or peeling skin change Heme: Denies easy bruising, bleeding, bleeding gums Neuro: Denies headache, numbness, weakness, slurred speech, loss of memory or consciousness   SUBJECTIVE:   VITAL SIGNS: Temp:  [97.6 F (36.4 C)] 97.6 F (36.4 C) (07/31 2050) Pulse Rate:  [51-62] 51 (07/31 2145) Resp:  [14-25] 14 (07/31 2045) BP: (108-121)/(43-66) 108/43 mmHg (07/31 2145) SpO2:  [100 %] 100 % (07/31  2145) HEMODYNAMICS:   VENTILATOR SETTINGS:   INTAKE / OUTPUT: Intake/Output   None     PHYSICAL EXAMINATION:  Gen: comfortable, speaking in full sentences HEENT: NCAT, PERRL, EOMi, OP clear, neck supple without masses PULM: few insp crackles anteriorly, few exp wheezes, normal respiratory effort, good air entry CV: RRR, no mgr, no JVD AB: BS+, soft, nontender, no hsm Ext: warm, pitting edema to L knee, s/p R BKA, no clubbing, no cyanosis Derm: no rash or skin breakdown Neuro: A&Ox4, MAEW, non-focal exam   LABS:  CBC Recent Labs     11/21/12  2050  11/21/12  2102  WBC  13.4*   --   HGB  7.1*  8.2*  HCT  21.3*  24.0*  PLT  450*   --    Coag's No results found for this basename: APTT, INR,  in the last 72 hours BMET Recent Labs  11/21/12  2102  NA  140  K  4.1  CL  107  BUN  25*  CREATININE  1.70*  GLUCOSE  83   Electrolytes No results found for this basename: CALCIUM, MG, PHOS,  in the last 72 hours Sepsis Markers No results found for this basename: LACTICACIDVEN, PROCALCITON, O2SATVEN,  in the last 72 hours ABG No results found for this basename: PHART, PCO2ART, PO2ART,  in the last 72 hours Liver Enzymes No results found for this basename: AST, ALT, ALKPHOS, BILITOT, ALBUMIN,  in the last 72 hours Cardiac Enzymes No results found for this basename: TROPONINI, PROBNP,  in the last 72 hours Glucose No results found for this basename: GLUCAP,  in the last 72 hours   EKG: NSR, TWI in lateral limb and precordial leads which is old, no new changes  ASSESSMENT / PLAN:  PULMONARY A: Mild respiratory distress now improved, due to pulm edema P:   -see cardiology  CARDIOVASCULAR A: Pulm edema, likely ne diagnosis of CHF CAD, NSTEMI without worrisome EKG changes; picture not consistent with acute coronary syndrome; presumably troponin leak related to LV strain/CHF with elevated Creatinine Hypertension PAD P:  -lasix -tele -asa -work up per  cardiology -see heme  RENAL A:  CKD? Baseline Cr in last year has ranged from 1.0 to 1.4; 2012 value as high as 1.7 P:   -monitor U  GASTROINTESTINAL A:  No acute issues P:     HEMATOLOGIC A:  Chronic anemia without evidence of bleeding; considering lack of clear evidence of active coronary ischemia, it is not clear that he would benefit from PRBC transfusion P:  -monitor for bleeding -needs anemia work up, per admitting team  INFECTIOUS A:  No issues P:     ENDOCRINE A:  DM2 P:   -per admitting team  NEUROLOGIC A:  No acute issues P:     TODAY'S SUMMARY: 72 y/o male with new diagnosis of CHF exacerbation and slightly elevated Cr from baseline; also with anemia of uncertain etiology;  At this point he is quite stable from a vital sign standpoint, does not clearly have acute coronary syndrome, and does not need ICU level care.  PCCM will sign off and will be available as needed.  Fonnie Jarvis Pulmonary and Critical Care Medicine [facility] Pager: [phone]  11/21/2012, 11:59 PM

## 2012-11-21 NOTE — Consult Note (Signed)
Cardiology Consult Note [name], MD No ref. provider found  Reason for consult: positive troponin  History of Present Illness (and review of medical records): Philip Strong is a 72 y.o. male who presents for evaluation of shortness of breath.  He is a patient of Dr. Kirke Corin.  He was last seen 07/2012 and last admitted in 05/2012.  He has hx of peripheral arterial disease with critical limb ischemia. He has known history of insulin-dependent diabetes mellitus, hypertension, and tobacco use. He has a history of coronary disease with remote myocardial infarction. He underwent coronary bypass surgery apparently in 1999. 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 He later developed an ulceration of the stump which has been slow to heal. Angiogram at that time showed occluded right SFA.  He was seen in January of this year rest pain involving the left leg with toe ulceration and early gangrenous changes. He underwent angiography which showed severe diffuse mid left SFA disease as well as one-vessel runoff below the knee with 99% stenosis in the anterior tibial artery which was the only artery supplying the foot.  He underwent a staged endovascular intervention on the left lower extremity under general anesthesia due to inability to safely sedate him during his diagnostic angiography. The procedure was done via an antegrade access of the left femoral artery which was extremely difficult due to his obesity. I was able to perform balloon angioplasty on the left anterior tibial and tibioperoneal trunk. The left SFA was diffusely diseased and I placed 2 self-expanding stents.  He has since had no problems until approximately 2-3 days prior to presentation with shortness of breath and dyspnea on exertion.  This has been associated with PND, orthopnea, and lower extremity swelling.  He denies any chest pain, palpitations, presyncope or syncope. In the ED, initial evaluation revealed an anxious  appearing male in mild distress with positive troponin, d-dimer, and anemia.  He underwent CTA which was negative for acute dissection.  This revealed however marked pulmonary edema and small pleural effusion.  He was evaluated by medicine and we were consulted for assistance with management.  Review of Systems Further review of systems was otherwise negative other than stated in HPI.  Patient Active Problem List   Diagnosis Date Noted  . CHF (congestive heart failure) 11/22/2012  . NSTEMI (non-ST elevated myocardial infarction) 11/22/2012  . Anemia 11/22/2012  . Atrial fibrillation 11/22/2012  . ARF (acute renal failure) 11/22/2012  . Critical lower limb ischemia 06/06/2012  . PAD (peripheral artery disease) 05/01/2012  . Ischemic toe 05/01/2012  . Coronary artery disease   . Hyperlipidemia   . Diabetes mellitus, insulin dependent (IDDM), uncontrolled   . Hypertension    Past Medical History  Diagnosis Date  . Diabetes mellitus, insulin dependent (IDDM), uncontrolled   . Hypertension   . Coronary artery disease     a. Remote MI. b. CABG 1999.  Marland Kitchen Myocardial infarct   . 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 PAD (peripheral artery disease) 05/01/2012    a. 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 b. s/p extensive LLE angioplasty and stent placement x 2 to the SFA and angioplasty of anterior tibial and tibioperoneal trunck in 05/2012.  . Ischemic toe   . Hyperlipidemia   . Prostate ca     s/p radioactive seed implant  . CHF (congestive heart failure)     Past Surgical History  Procedure  Laterality Date  . Leg amputation below knee      right  . Coronary artery bypass graft    . Right patella removal    . Right finger fracture    . Closed reduction clavicle fracture    . Angioplasty illiac artery  06/05/2012    Prescriptions prior to admission  Medication Sig Dispense Refill  . acetaminophen (TYLENOL) 325 MG tablet Take 325 mg by mouth daily as  needed for pain. For pain      . amLODipine (NORVASC) 10 MG tablet Take 10 mg by mouth daily.      Marland Kitchen amoxicillin (AMOXIL) 500 MG capsule Take 500 mg by mouth 2 (two) times daily.      Marland Kitchen aspirin 325 MG tablet Take 325 mg by mouth daily.      Marland Kitchen atorvastatin (LIPITOR) 80 MG tablet Take 1 tablet (80 mg total) by mouth daily.  30 tablet  6  . clopidogrel (PLAVIX) 75 MG tablet Take 1 tablet (75 mg total) by mouth daily.  30 tablet  6  . ferrous sulfate 325 (65 FE) MG tablet Take 325 mg by mouth daily with breakfast.      . furosemide (LASIX) 20 MG tablet Take 20 mg by mouth every morning.       Marland Kitchen HYDROcodone-acetaminophen (NORCO/VICODIN) 5-325 MG per tablet Take 1 tablet by mouth every 8 (eight) hours as needed for pain. For pain      . insulin NPH-insulin regular (NOVOLIN 70/30) (70-30) 100 UNIT/ML injection Inject 75 Units into the skin 2 (two) times daily with a meal.       . lisinopril (PRINIVIL,ZESTRIL) 40 MG tablet Take 40 mg by mouth daily.      . metoprolol succinate (TOPROL-XL) 100 MG 24 hr tablet Take 100 mg by mouth daily. Take with or immediately following a meal.      . oxyCODONE-acetaminophen (PERCOCET) 10-325 MG per tablet Take 1 tablet by mouth 2 (two) times daily as needed for pain.      . potassium chloride (K-DUR) 10 MEQ tablet Take 10 mEq by mouth daily.      . prednisoLONE acetate (PRED FORTE) 1 % ophthalmic suspension Place 1 drop into both eyes 2 (two) times daily as needed. For inflammation/eye irritation      . ranitidine (ZANTAC) 150 MG tablet Take 150 mg by mouth daily.       No Known Allergies  History  Substance Use Topics  . Smoking status: Current Every Day Smoker -- 0.50 packs/day for 15 years  . Smokeless tobacco: Not on file  . Alcohol Use: Yes     Comment: occ    Family History  Problem Relation Age of Onset  . Heart attack Mother      Objective:  Patient Vitals for the past 8 hrs:  BP Temp Temp src Pulse Resp SpO2 Height Weight  11/22/12 0145 - - - - -  - - 130.6 kg (287 lb 14.7 oz)  11/22/12 0141 115/54 mmHg 97.7 F (36.5 C) Oral 49 16 100 % 6\' 3"  (1.905 m) -  11/21/12 2145 108/43 mmHg - - 51 - 100 % - -   General appearance: alert, cooperative, appears stated age and mild distress Head: Normocephalic, without obvious abnormality, atraumatic Eyes: conjunctivae/corneas clear. PERRL, EOM's intact. Fundi benign. Neck: positive JVD with HJR Lungs: decreased breath sounds, no wheezing Chest wall: no tenderness Heart: regular rate and rhythm, S1, S2 normal,  Abdomen: soft, non-tender; bowel sounds normal; no  masses,  no organomegaly Extremities: LLE edema, R BKA Pulses: 2+ and symmetric Neurologic: Grossly normal  Results for orders placed during the hospital encounter of 11/21/12 (from the past 48 hour(s))  CBC WITH DIFFERENTIAL     Status: Abnormal   Collection Time    11/21/12  8:50 PM      Result Value Range   WBC 13.4 (*) 4.0 - 10.5 K/uL   RBC 3.34 (*) 4.22 - 5.81 MIL/uL   Hemoglobin 7.1 (*) 13.0 - 17.0 g/dL   HCT 28.4 (*) 13.2 - 44.0 %   MCV 63.8 (*) 78.0 - 100.0 fL   MCH 21.3 (*) 26.0 - 34.0 pg   MCHC 33.3  30.0 - 36.0 g/dL   RDW 10.2 (*) 72.5 - 36.6 %   Platelets 450 (*) 150 - 400 K/uL   Neutrophils Relative % 77  43 - 77 %   Lymphocytes Relative 17  12 - 46 %   Monocytes Relative 6  3 - 12 %   Eosinophils Relative 0  0 - 5 %   Basophils Relative 0  0 - 1 %   Neutro Abs 10.3 (*) 1.7 - 7.7 K/uL   Lymphs Abs 2.3  0.7 - 4.0 K/uL   Monocytes Absolute 0.8  0.1 - 1.0 K/uL   Eosinophils Absolute 0.0  0.0 - 0.7 K/uL   Basophils Absolute 0.0  0.0 - 0.1 K/uL   RBC Morphology POLYCHROMASIA PRESENT     Smear Review       Value: PLATELET CLUMPS NOTED ON SMEAR, COUNT APPEARS ADEQUATE  D-DIMER, QUANTITATIVE     Status: Abnormal   Collection Time    11/21/12  8:50 PM      Result Value Range   D-Dimer, Quant 2.83 (*) 0.00 - 0.48 ug/mL-FEU   Comment:            AT THE INHOUSE ESTABLISHED CUTOFF     VALUE OF 0.48 ug/mL FEU,      THIS ASSAY HAS BEEN DOCUMENTED     IN THE LITERATURE TO HAVE     A SENSITIVITY AND NEGATIVE     PREDICTIVE VALUE OF AT LEAST     98 TO 99%.  THE TEST RESULT     SHOULD BE CORRELATED WITH     AN ASSESSMENT OF THE CLINICAL     PROBABILITY OF DVT / VTE.  POCT I-STAT TROPONIN I     Status: Abnormal   Collection Time    11/21/12  8:57 PM      Result Value Range   Troponin i, poc 0.42 (*) 0.00 - 0.08 ng/mL   Comment NOTIFIED PHYSICIAN     Comment 3            Comment: Due to the release kinetics of cTnI,     a negative result within the first hours     of the onset of symptoms does not rule out     myocardial infarction with certainty.     If myocardial infarction is still suspected,     repeat the test at appropriate intervals.  POCT I-STAT, CHEM 8     Status: Abnormal   Collection Time    11/21/12  9:02 PM      Result Value Range   Sodium 140  135 - 145 mEq/L   Potassium 4.1  3.5 - 5.1 mEq/L   Chloride 107  96 - 112 mEq/L   BUN 25 (*) 6 - 23 mg/dL  Creatinine, Ser 1.70 (*) 0.50 - 1.35 mg/dL   Glucose, Bld 83  70 - 99 mg/dL   Calcium, Ion 1.61  0.96 - 1.30 mmol/L   TCO2 18  0 - 100 mmol/L   Hemoglobin 8.2 (*) 13.0 - 17.0 g/dL   HCT 04.5 (*) 40.9 - 81.1 %  PREPARE RBC (CROSSMATCH)     Status: None   Collection Time    11/21/12 10:30 PM      Result Value Range   Order Confirmation ORDER PROCESSED BY BLOOD BANK    TYPE AND SCREEN     Status: None   Collection Time    11/21/12 10:30 PM      Result Value Range   ABO/RH(D) B POS     Antibody Screen NEG     Sample Expiration 11/24/2012     Unit Number [id number]     Blood Component Type RED CELLS,LR     Unit division 00     Status of Unit ISSUED     Transfusion Status OK TO TRANSFUSE     Crossmatch Result Compatible     Unit Number [id number]     Blood Component Type RED CELLS,LR     Unit division 00     Status of Unit ALLOCATED     Transfusion Status OK TO TRANSFUSE     Crossmatch Result Compatible    MRSA PCR  SCREENING     Status: None   Collection Time    11/22/12  1:47 AM      Result Value Range   MRSA by PCR NEGATIVE  NEGATIVE   Comment:            The GeneXpert MRSA Assay (FDA     approved for NASAL specimens     only), is one component of a     comprehensive MRSA colonization     surveillance program. It is not     intended to diagnose MRSA     infection nor to guide or     monitor treatment for     MRSA infections.   Ct Angio Chest Pe W/cm &/or Wo Cm  11/21/2012   *RADIOLOGY REPORT*  Clinical Data:  Chest pain, back pain and shortness of breath.  CT ANGIOGRAPHY CHEST, ABDOMEN AND PELVIS  Technique:  Multidetector CT imaging through the chest, abdomen and pelvis was performed using the standard protocol during bolus administration of intravenous contrast.  Multiplanar reconstructed images including MIPs were obtained and reviewed to evaluate the vascular anatomy.  Contrast: 80mL OMNIPAQUE IOHEXOL 350 MG/ML SOLN  Comparison:   None.  CTA CHEST  Findings:  The patient has had prior CABG.  The heart is moderately enlarged.  There is evidence of florid pulmonary edema, especially in the upper lobes bilaterally.  Associated small right pleural effusion is present.  The thoracic aorta is of normal caliber and shows no evidence of aneurysmal disease or dissection.  Pulmonary arteries are also well opacified and are unremarkable in appearance.  There is no evidence of pulmonary embolism.  No pulmonary masses are seen.  There are a number of small to borderline lymph nodes identified throughout the mediastinum.  The largest lower right paratracheal lymph node near the carina measures approximately 1.8 cm in short axis.  No enlarged axillary lymph nodes are identified.  The bony thorax is unremarkable.   Review of the MIP images confirms the above findings.  IMPRESSION:  1.  Normal thoracic aorta.  No evidence of aneurysmal disease  or aortic dissection. 2.  Florid pulmonary edema with small right pleural  effusion. 3.  Borderline lymph nodes throughout the mediastinum.  These may be reactive.  Consider follow-up CT of the chest.  CTA ABDOMEN AND PELVIS  Findings:  The abdominal aorta is of normal caliber. Atherosclerosis is identified involving the distal aorta and numerous branch vessels.  There is no evidence of aortic dissection.  Moderate stenosis at the origin of the celiac axis approaches 50 - 60% in estimated caliber.  The superior mesenteric artery shows scattered plaque without significant stenosis.  Bilateral single renal arteries show proximal plaque without high-grade stenosis. The inferior mesenteric artery is open.  Diffuse atherosclerosis of the bilateral iliac arteries is identified without evidence of aneurysm.  Greatest degree of stenosis is at the level of the mid left external iliac artery showing roughly 50% narrowing.  Bilateral common femoral arteries show significant plaque.  There is visible proximal occlusion of the right superficial femoral artery.  Nonvascular evaluation on the arterial phase study shows an unremarkable appearance to the solid organs of the abdomen.  No abnormal fluid collections are seen.  No evidence of masses or enlarged lymph nodes.  Bowel is unremarkable.  No hernias are seen. Bladder is within normal limits.  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  Bony structures show degenerative changes of the lower lumbar spine with fusion of the L4 and L5 vertebral bodies.   Review of the MIP images confirms the above findings.  IMPRESSION:  1.  No evidence of aortic aneurysm or dissection. 2.  Atherosclerosis evident throughout the abdomen and pelvis. There is roughly a 50% stenosis of the left external iliac artery. There is evidence of proximal occlusion of the right superficial femoral artery.  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   Original Report Authenticated By: Irish Lack, M.D.   Ct Cta Abd/pel W/cm &/or W/o Cm  11/21/2012    *RADIOLOGY REPORT*  Clinical Data:  Chest pain, back pain and shortness of breath.  CT ANGIOGRAPHY CHEST, ABDOMEN AND PELVIS  Technique:  Multidetector CT imaging through the chest, abdomen and pelvis was performed using the standard protocol during bolus administration of intravenous contrast.  Multiplanar reconstructed images including MIPs were obtained and reviewed to evaluate the vascular anatomy.  Contrast: 80mL OMNIPAQUE IOHEXOL 350 MG/ML SOLN  Comparison:   None.  CTA CHEST  Findings:  The patient has had prior CABG.  The heart is moderately enlarged.  There is evidence of florid pulmonary edema, especially in the upper lobes bilaterally.  Associated small right pleural effusion is present.  The thoracic aorta is of normal caliber and shows no evidence of aneurysmal disease or dissection.  Pulmonary arteries are also well opacified and are unremarkable in appearance.  There is no evidence of pulmonary embolism.  No pulmonary masses are seen.  There are a number of small to borderline lymph nodes identified throughout the mediastinum.  The largest lower right paratracheal lymph node near the carina measures approximately 1.8 cm in short axis.  No enlarged axillary lymph nodes are identified.  The bony thorax is unremarkable.   Review of the MIP images confirms the above findings.  IMPRESSION:  1.  Normal thoracic aorta.  No evidence of aneurysmal disease or aortic dissection. 2.  Florid pulmonary edema with small right pleural effusion. 3.  Borderline lymph nodes throughout the mediastinum.  These may be reactive.  Consider follow-up CT of the chest.  CTA ABDOMEN AND  PELVIS  Findings:  The abdominal aorta is of normal caliber. Atherosclerosis is identified involving the distal aorta and numerous branch vessels.  There is no evidence of aortic dissection.  Moderate stenosis at the origin of the celiac axis approaches 50 - 60% in estimated caliber.  The superior mesenteric artery shows scattered plaque without  significant stenosis.  Bilateral single renal arteries show proximal plaque without high-grade stenosis. The inferior mesenteric artery is open.  Diffuse atherosclerosis of the bilateral iliac arteries is identified without evidence of aneurysm.  Greatest degree of stenosis is at the level of the mid left external iliac artery showing roughly 50% narrowing.  Bilateral common femoral arteries show significant plaque.  There is visible proximal occlusion of the right superficial femoral artery.  Nonvascular evaluation on the arterial phase study shows an unremarkable appearance to the solid organs of the abdomen.  No abnormal fluid collections are seen.  No evidence of masses or enlarged lymph nodes.  Bowel is unremarkable.  No hernias are seen. Bladder is within normal limits.  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  Bony structures show degenerative changes of the lower lumbar spine with fusion of the L4 and L5 vertebral bodies.   Review of the MIP images confirms the above findings.  IMPRESSION:  1.  No evidence of aortic aneurysm or dissection. 2.  Atherosclerosis evident throughout the abdomen and pelvis. There is roughly a 50% stenosis of the left external iliac artery. There is evidence of proximal occlusion of the right superficial femoral artery.  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   Original Report Authenticated By: Irish Lack, M.D.    ECG:  Previous ecg revealed sinus rhythm, current ecg and rhythm strips in ED appear to be atrial fibrillation with slow ventricular response.  Impression: Acute CHF Acute on CKD Severe Anemia Atrial Fibrillation Nonspecific Troponin elevation CAD s/p CABG PAD s/p right BKA and SFA angioplasty HTN DM  Patient has complex cardiovascular history with prior preserved LV function p/w dyspnea, PND, orthopnea, pulmonary edema in setting of anemia, acute on CKD and mild troponin elevation.  No active signs of bleeding.   CTA r/o acute intrathoracic and abdominal vascular pathology.  Recommendations: -Agree with initial blood transfusion for severe anemia. -Following will diuresis with IV Lasix 40mg  BID.   -Strict I/Os, daily weights, 1.5 liter fluid restriction, 2gm Na diet -Monitor Cr closely, recent contrast dye -TTE in am assess LV function, last known EF preserved 05/2012 -Trend cardiac biomarkers, no urgent need for invasive ischemic evaluation at this time -Continue ASA, Plavix as no signs of acute bleeding -If remains in Afib, will need to address regimen with anticoagulation vs dual antiplatelet, continue as above. Also no documented history, unclear when started, consider TEE/DCCV.  We will follow along with you and make further recommendations as needed pending initial studies and clinical course.

## 2012-11-21 NOTE — ED Notes (Signed)
Patient arrived via [facility] from home with shortness of breath. Patient called EMS earlier today with same symptoms and refused to be transported. VSS. Patient is maintaining O2 sats on RA without difficulty.

## 2012-11-21 NOTE — ED Provider Notes (Signed)
CSN: [id number]     Arrival date & time 11/21/12  2002 History     First MD Initiated Contact with Patient 11/21/12 2011     Chief Complaint  Patient presents with  . Shortness of Breath   (Consider location/radiation/quality/duration/timing/severity/associated sxs/prior Treatment) Patient is a 72 y.o. male presenting with chest pain. The history is provided by the patient.  Chest Pain Pain location:  Substernal area Pain quality: aching and pressure   Pain radiates to:  Does not radiate Pain severity:  Severe Onset quality:  Sudden Timing:  Intermittent Progression:  Waxing and waning Chronicity:  New Relieved by:  None tried Worsened by:  Nothing tried Ineffective treatments:  Rest, oxygen and aspirin Associated symptoms: nausea, shortness of breath and weakness   Associated symptoms: no abdominal pain, no back pain, no cough, no dysphagia, no fatigue, no fever, no headache, no numbness and not vomiting     Past Medical History  Diagnosis Date  . Diabetes mellitus, insulin dependent (IDDM), uncontrolled   . Hypertension   . Coronary artery disease     a. Remote MI. b. CABG 1999.  Marland Kitchen Myocardial infarct   . 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 PAD (peripheral artery disease) 05/01/2012    a. 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 b. s/p extensive LLE angioplasty and stent placement x 2 to the SFA and angioplasty of anterior tibial and tibioperoneal trunck in 05/2012.  . Ischemic toe   . Hyperlipidemia   . Prostate ca     s/p radioactive seed implant  . CHF (congestive heart failure)    Past Surgical History  Procedure Laterality Date  . Leg amputation below knee      right  . Coronary artery bypass graft    . Right patella removal    . Right finger fracture    . Closed reduction clavicle fracture    . Angioplasty illiac artery  06/05/2012   Family History  Problem Relation Age of Onset  . Heart attack Mother    History   Substance Use Topics  . Smoking status: Former Smoker    Quit date: 05/23/2012  . Smokeless tobacco: Not on file  . Alcohol Use: Yes     Comment: occ    Review of Systems  Constitutional: Negative for fever, activity change, appetite change and fatigue.  HENT: Negative for congestion, sore throat, facial swelling, rhinorrhea, trouble swallowing, neck pain, neck stiffness, voice change and sinus pressure.   Eyes: Negative.   Respiratory: Positive for shortness of breath. Negative for cough, choking, chest tightness and wheezing.   Cardiovascular: Positive for chest pain.  Gastrointestinal: Positive for nausea. Negative for vomiting and abdominal pain.  Genitourinary: Negative for dysuria, urgency, frequency, hematuria, flank pain and difficulty urinating.  Musculoskeletal: Negative for back pain and gait problem.  Skin: Negative for rash and wound.  Neurological: Positive for weakness. Negative for facial asymmetry, numbness and headaches.  Psychiatric/Behavioral: Negative for behavioral problems, confusion and agitation. The patient is not nervous/anxious and is not hyperactive.   All other systems reviewed and are negative.    Allergies  Review of patient's allergies indicates no known allergies.  Home Medications   Current Outpatient Rx  Name  Route  Sig  Dispense  Refill  . acetaminophen (TYLENOL) 325 MG tablet   Oral   Take 325 mg by mouth daily as needed for pain. For pain         .  amLODipine (NORVASC) 10 MG tablet   Oral   Take 10 mg by mouth daily.         Marland Kitchen amoxicillin (AMOXIL) 500 MG capsule   Oral   Take 500 mg by mouth 2 (two) times daily.         Marland Kitchen aspirin 325 MG tablet   Oral   Take 325 mg by mouth daily.         Marland Kitchen atorvastatin (LIPITOR) 80 MG tablet   Oral   Take 1 tablet (80 mg total) by mouth daily.   30 tablet   6   . clopidogrel (PLAVIX) 75 MG tablet   Oral   Take 1 tablet (75 mg total) by mouth daily.   30 tablet   6   . ferrous  sulfate 325 (65 FE) MG tablet   Oral   Take 325 mg by mouth daily with breakfast.         . furosemide (LASIX) 20 MG tablet   Oral   Take 20 mg by mouth every morning.          Marland Kitchen HYDROcodone-acetaminophen (NORCO/VICODIN) 5-325 MG per tablet   Oral   Take 1 tablet by mouth every 8 (eight) hours as needed for pain. For pain         . insulin NPH-insulin regular (NOVOLIN 70/30) (70-30) 100 UNIT/ML injection   Subcutaneous   Inject 75 Units into the skin 2 (two) times daily with a meal.          . lisinopril (PRINIVIL,ZESTRIL) 40 MG tablet   Oral   Take 40 mg by mouth daily.         . metoprolol succinate (TOPROL-XL) 100 MG 24 hr tablet   Oral   Take 100 mg by mouth daily. Take with or immediately following a meal.         . oxyCODONE-acetaminophen (PERCOCET) 10-325 MG per tablet   Oral   Take 1 tablet by mouth 2 (two) times daily as needed for pain.         . potassium chloride (K-DUR) 10 MEQ tablet   Oral   Take 10 mEq by mouth daily.         . prednisoLONE acetate (PRED FORTE) 1 % ophthalmic suspension   Both Eyes   Place 1 drop into both eyes 2 (two) times daily as needed. For inflammation/eye irritation         . ranitidine (ZANTAC) 150 MG tablet   Oral   Take 150 mg by mouth daily.          BP 108/43  Pulse 51  Temp(Src) 97.6 F (36.4 C)  Resp 14  SpO2 100% Physical Exam  Nursing note and vitals reviewed. Constitutional: He is oriented to person, place, and time. He appears well-developed and well-nourished. He appears distressed (Patient appears uncomfortable and anxious).  HENT:  Head: Normocephalic and atraumatic.  Right Ear: External ear normal.  Left Ear: External ear normal.  Mouth/Throat: No oropharyngeal exudate.  Eyes: Conjunctivae and EOM are normal. Pupils are equal, round, and reactive to light. Right eye exhibits no discharge. Left eye exhibits no discharge.  Neck: Normal range of motion. Neck supple. No JVD present. No  tracheal deviation present. No thyromegaly present.  Cardiovascular: Normal rate, regular rhythm, normal heart sounds and intact distal pulses.  Exam reveals no gallop and no friction rub.   No murmur heard. Pulmonary/Chest: Effort normal and breath sounds normal. No respiratory distress. He has  no wheezes. He exhibits no tenderness.  Abdominal: Soft. Bowel sounds are normal. He exhibits no distension. There is no tenderness. There is no rebound and no guarding.  Musculoskeletal: Normal range of motion. He exhibits no edema and no tenderness.  Lymphadenopathy:    He has no cervical adenopathy.  Neurological: He is alert and oriented to person, place, and time. No cranial nerve deficit.  Skin: Skin is warm and dry. No rash noted. He is not diaphoretic. No pallor.  Psychiatric: He has a normal mood and affect. His behavior is normal.    ED Course   Procedures (including critical care time)  Labs Reviewed  CBC WITH DIFFERENTIAL - Abnormal; Notable for the following:    WBC 13.4 (*)    RBC 3.34 (*)    Hemoglobin 7.1 (*)    HCT 21.3 (*)    MCV 63.8 (*)    MCH 21.3 (*)    RDW 18.2 (*)    Platelets 450 (*)    Neutro Abs 10.3 (*)    All other components within normal limits  D-DIMER, QUANTITATIVE - Abnormal; Notable for the following:    D-Dimer, Quant 2.83 (*)    All other components within normal limits  POCT I-STAT, CHEM 8 - Abnormal; Notable for the following:    BUN 25 (*)    Creatinine, Ser 1.70 (*)    Hemoglobin 8.2 (*)    HCT 24.0 (*)    All other components within normal limits  POCT I-STAT TROPONIN I - Abnormal; Notable for the following:    Troponin i, poc 0.42 (*)    All other components within normal limits  PREPARE RBC (CROSSMATCH)  TYPE AND SCREEN   Ct Angio Chest Pe W/cm &/or Wo Cm  11/21/2012   *RADIOLOGY REPORT*  Clinical Data:  Chest pain, back pain and shortness of breath.  CT ANGIOGRAPHY CHEST, ABDOMEN AND PELVIS  Technique:  Multidetector CT imaging through  the chest, abdomen and pelvis was performed using the standard protocol during bolus administration of intravenous contrast.  Multiplanar reconstructed images including MIPs were obtained and reviewed to evaluate the vascular anatomy.  Contrast: 80mL OMNIPAQUE IOHEXOL 350 MG/ML SOLN  Comparison:   None.  CTA CHEST  Findings:  The patient has had prior CABG.  The heart is moderately enlarged.  There is evidence of florid pulmonary edema, especially in the upper lobes bilaterally.  Associated small right pleural effusion is present.  The thoracic aorta is of normal caliber and shows no evidence of aneurysmal disease or dissection.  Pulmonary arteries are also well opacified and are unremarkable in appearance.  There is no evidence of pulmonary embolism.  No pulmonary masses are seen.  There are a number of small to borderline lymph nodes identified throughout the mediastinum.  The largest lower right paratracheal lymph node near the carina measures approximately 1.8 cm in short axis.  No enlarged axillary lymph nodes are identified.  The bony thorax is unremarkable.   Review of the MIP images confirms the above findings.  IMPRESSION:  1.  Normal thoracic aorta.  No evidence of aneurysmal disease or aortic dissection. 2.  Florid pulmonary edema with small right pleural effusion. 3.  Borderline lymph nodes throughout the mediastinum.  These may be reactive.  Consider follow-up CT of the chest.  CTA ABDOMEN AND PELVIS  Findings:  The abdominal aorta is of normal caliber. Atherosclerosis is identified involving the distal aorta and numerous branch vessels.  There is no evidence of aortic  dissection.  Moderate stenosis at the origin of the celiac axis approaches 50 - 60% in estimated caliber.  The superior mesenteric artery shows scattered plaque without significant stenosis.  Bilateral single renal arteries show proximal plaque without high-grade stenosis. The inferior mesenteric artery is open.  Diffuse atherosclerosis  of the bilateral iliac arteries is identified without evidence of aneurysm.  Greatest degree of stenosis is at the level of the mid left external iliac artery showing roughly 50% narrowing.  Bilateral common femoral arteries show significant plaque.  There is visible proximal occlusion of the right superficial femoral artery.  Nonvascular evaluation on the arterial phase study shows an unremarkable appearance to the solid organs of the abdomen.  No abnormal fluid collections are seen.  No evidence of masses or enlarged lymph nodes.  Bowel is unremarkable.  No hernias are seen. Bladder is within normal limits.  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  Bony structures show degenerative changes of the lower lumbar spine with fusion of the L4 and L5 vertebral bodies.   Review of the MIP images confirms the above findings.  IMPRESSION:  1.  No evidence of aortic aneurysm or dissection. 2.  Atherosclerosis evident throughout the abdomen and pelvis. There is roughly a 50% stenosis of the left external iliac artery. There is evidence of proximal occlusion of the right superficial femoral artery.  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   Original Report Authenticated By: Irish Lack, M.D.   Ct Cta Abd/pel W/cm &/or W/o Cm  11/21/2012   *RADIOLOGY REPORT*  Clinical Data:  Chest pain, back pain and shortness of breath.  CT ANGIOGRAPHY CHEST, ABDOMEN AND PELVIS  Technique:  Multidetector CT imaging through the chest, abdomen and pelvis was performed using the standard protocol during bolus administration of intravenous contrast.  Multiplanar reconstructed images including MIPs were obtained and reviewed to evaluate the vascular anatomy.  Contrast: 80mL OMNIPAQUE IOHEXOL 350 MG/ML SOLN  Comparison:   None.  CTA CHEST  Findings:  The patient has had prior CABG.  The heart is moderately enlarged.  There is evidence of florid pulmonary edema, especially in the upper lobes bilaterally.   Associated small right pleural effusion is present.  The thoracic aorta is of normal caliber and shows no evidence of aneurysmal disease or dissection.  Pulmonary arteries are also well opacified and are unremarkable in appearance.  There is no evidence of pulmonary embolism.  No pulmonary masses are seen.  There are a number of small to borderline lymph nodes identified throughout the mediastinum.  The largest lower right paratracheal lymph node near the carina measures approximately 1.8 cm in short axis.  No enlarged axillary lymph nodes are identified.  The bony thorax is unremarkable.   Review of the MIP images confirms the above findings.  IMPRESSION:  1.  Normal thoracic aorta.  No evidence of aneurysmal disease or aortic dissection. 2.  Florid pulmonary edema with small right pleural effusion. 3.  Borderline lymph nodes throughout the mediastinum.  These may be reactive.  Consider follow-up CT of the chest.  CTA ABDOMEN AND PELVIS  Findings:  The abdominal aorta is of normal caliber. Atherosclerosis is identified involving the distal aorta and numerous branch vessels.  There is no evidence of aortic dissection.  Moderate stenosis at the origin of the celiac axis approaches 50 - 60% in estimated caliber.  The superior mesenteric artery shows scattered plaque without significant stenosis.  Bilateral single renal arteries show proximal plaque  without high-grade stenosis. The inferior mesenteric artery is open.  Diffuse atherosclerosis of the bilateral iliac arteries is identified without evidence of aneurysm.  Greatest degree of stenosis is at the level of the mid left external iliac artery showing roughly 50% narrowing.  Bilateral common femoral arteries show significant plaque.  There is visible proximal occlusion of the right superficial femoral artery.  Nonvascular evaluation on the arterial phase study shows an unremarkable appearance to the solid organs of the abdomen.  No abnormal fluid collections are  seen.  No evidence of masses or enlarged lymph nodes.  Bowel is unremarkable.  No hernias are seen. Bladder is within normal limits.  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  Bony structures show degenerative changes of the lower lumbar spine with fusion of the L4 and L5 vertebral bodies.   Review of the MIP images confirms the above findings.  IMPRESSION:  1.  No evidence of aortic aneurysm or dissection. 2.  Atherosclerosis evident throughout the abdomen and pelvis. There is roughly a 50% stenosis of the left external iliac artery. There is evidence of proximal occlusion of the right superficial femoral artery.  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   Original Report Authenticated By: Irish Lack, M.D.   No diagnosis found.  MDM  72 yr old Pt with PMH of DM, HTN, CAD s/p CABG here with substernal CP and SOB. Says it has been intermittent for 2 days. Called ambulance earlier today with episode but it spontaneously resolved and he did not come to ED. Now here with intermittent SOB and chest heaviness. He is also complaining of pain in his right knee but does have chronic pain with his DM. Patient with concerning hx and medical problems. Patient with NSR on ekg, normal bilateral wrist pulses. Possible concern for PE. Patient got angiography. Elevated troponin and d-dimer. No signs of dissection on imaging. Had florid pulmonary edema on CT. Gave lasix but patient hypotensive. Cardiology will consult and patient will be admitted to tele. Patient given 2 units of pRBC given anemia with possible ACS.   Case discussed with Dr. Salome Spotted, MD 11/22/12 [phone]

## 2012-11-21 NOTE — ED Provider Notes (Signed)
Complains of anterior chest pain shortness of breath and right leg pain. Right leg pain started this morning. As it is at the thigh and the posterior knee. I made better or worse by anything. He is presently not having chest pain but complains of shortness of breath he also complains of chronic back pain. Patient troponin noted to be elevated. D-dimer elevated. On exam patient is mildly anxious, ill-appearing lungs clear auscultation heart regular rate and rhythm abdomen obese nontender right lower chest remedy with BKA. He is also noted to be anemic. We will obtain CT angiogram to rule out aortic dissection. He's also noted to be anemic. Will require blood transfusion in light of symptomatic anemia. Patient has worsening renal insufficiency. In light of moderate to high clinical suspicion for aortic dissection I feel that he will he requires CT and a gram. He will be given reduced dose of intravenous contrast material. Other illnesses in differential diagnosis  includednon-ST elevation myocardial infarction and pulmonary embolism CRITICAL CARE Performed by: Doug Sou Total critical care time: 30 minute Critical care time was exclusive of separately billable procedures and treating other patients. Critical care was necessary to treat or prevent imminent or life-threatening deterioration. Critical care was time spent personally by me on the following activities: development of treatment plan with patient and/or surrogate as well as nursing, discussions with consultants, evaluation of patient's response to treatment, examination of patient, obtaining history from patient or surrogate, ordering and performing treatments and interventions, ordering and review of laboratory studies, ordering and review of radiographic studies, pulse oximetry and re-evaluation of patient's condition.  Doug Sou, MD 11/21/12 2245

## 2012-11-21 NOTE — ED Provider Notes (Signed)
Spoke with Dr. Terressa Koyanagi today patient he wishes to consult on patient I also spoke with Dr. Darrick Penna, who will arrange for critical care team to evaluate patient  Doug Sou, MD 11/21/12 2327

## 2012-11-22 ENCOUNTER — Encounter (HOSPITAL_COMMUNITY): Payer: Self-pay | Admitting: *Deleted

## 2012-11-22 DIAGNOSIS — I5033 Acute on chronic diastolic (congestive) heart failure: Secondary | ICD-10-CM

## 2012-11-22 DIAGNOSIS — I4891 Unspecified atrial fibrillation: Secondary | ICD-10-CM

## 2012-11-22 DIAGNOSIS — I059 Rheumatic mitral valve disease, unspecified: Secondary | ICD-10-CM

## 2012-11-22 DIAGNOSIS — I214 Non-ST elevation (NSTEMI) myocardial infarction: Secondary | ICD-10-CM | POA: Diagnosis present

## 2012-11-22 DIAGNOSIS — N179 Acute kidney failure, unspecified: Secondary | ICD-10-CM

## 2012-11-22 DIAGNOSIS — I509 Heart failure, unspecified: Secondary | ICD-10-CM | POA: Diagnosis present

## 2012-11-22 DIAGNOSIS — D649 Anemia, unspecified: Secondary | ICD-10-CM | POA: Diagnosis present

## 2012-11-22 LAB — CBC WITH DIFFERENTIAL/PLATELET
Basophils Absolute: 0 10*3/uL (ref 0.0–0.1)
Basophils Relative: 0 % (ref 0–1)
Eosinophils Absolute: 0 10*3/uL (ref 0.0–0.7)
Eosinophils Relative: 1 % (ref 0–5)
HCT: 24.7 % — ABNORMAL LOW (ref 39.0–52.0)
Hemoglobin: 8.2 g/dL — ABNORMAL LOW (ref 13.0–17.0)
Lymphocytes Relative: 17 % (ref 12–46)
MCH: 22.6 pg — ABNORMAL LOW (ref 26.0–34.0)
MCHC: 34.4 g/dL (ref 30.0–36.0)
Monocytes Absolute: 0.8 10*3/uL (ref 0.1–1.0)
Monocytes Relative: 4 % (ref 3–12)
Neutro Abs: 6.2 10*3/uL (ref 1.7–7.7)
Neutrophils Relative %: 78 % — ABNORMAL HIGH (ref 43–77)
Platelets: 369 10*3/uL (ref 150–400)
RBC: 3.4 MIL/uL — ABNORMAL LOW (ref 4.22–5.81)
RBC: 3.64 MIL/uL — ABNORMAL LOW (ref 4.22–5.81)
WBC: 8 10*3/uL (ref 4.0–10.5)

## 2012-11-22 LAB — URINALYSIS, ROUTINE W REFLEX MICROSCOPIC
Glucose, UA: NEGATIVE mg/dL
Hgb urine dipstick: NEGATIVE
Specific Gravity, Urine: 1.034 — ABNORMAL HIGH (ref 1.005–1.030)
Urobilinogen, UA: 1 mg/dL (ref 0.0–1.0)
pH: 5 (ref 5.0–8.0)

## 2012-11-22 LAB — COMPREHENSIVE METABOLIC PANEL
ALT: 1586 U/L — ABNORMAL HIGH (ref 0–53)
ALT: 1496 U/L — ABNORMAL HIGH (ref 0–53)
AST: 2296 U/L — ABNORMAL HIGH (ref 0–37)
Alkaline Phosphatase: 366 U/L — ABNORMAL HIGH (ref 39–117)
CO2: 17 mEq/L — ABNORMAL LOW (ref 19–32)
CO2: 18 mEq/L — ABNORMAL LOW (ref 19–32)
Calcium: 8.4 mg/dL (ref 8.4–10.5)
Chloride: 103 mEq/L (ref 96–112)
Creatinine, Ser: 1.47 mg/dL — ABNORMAL HIGH (ref 0.50–1.35)
GFR calc Af Amer: 53 mL/min — ABNORMAL LOW (ref >90)
GFR calc Af Amer: 44 mL/min — ABNORMAL LOW (ref >90)
GFR calc non Af Amer: 46 mL/min — ABNORMAL LOW (ref >90)
GFR calc non Af Amer: 38 mL/min — ABNORMAL LOW (ref >90)
Glucose, Bld: 77 mg/dL (ref 70–99)
Glucose, Bld: 194 mg/dL — ABNORMAL HIGH (ref 70–99)
Sodium: 134 mEq/L — ABNORMAL LOW (ref 135–145)
Total Bilirubin: 0.9 mg/dL (ref 0.3–1.2)
Total Bilirubin: 0.6 mg/dL (ref 0.3–1.2)

## 2012-11-22 LAB — TROPONIN I: Troponin I: 0.53 ng/mL (ref <0.30)

## 2012-11-22 LAB — GLUCOSE, CAPILLARY
Glucose-Capillary: 65 mg/dL — ABNORMAL LOW (ref 70–99)
Glucose-Capillary: 208 mg/dL — ABNORMAL HIGH (ref 70–99)

## 2012-11-22 LAB — URINE MICROSCOPIC-ADD ON

## 2012-11-22 MED ORDER — INSULIN ASPART 100 UNIT/ML ~~LOC~~ SOLN
0.0000 [IU] | Freq: Three times a day (TID) | SUBCUTANEOUS | Status: DC
  Administered 2012-11-22: 1 [IU] via SUBCUTANEOUS
  Administered 2012-11-22: 3 [IU] via SUBCUTANEOUS
  Administered 2012-11-23: 5 [IU] via SUBCUTANEOUS
  Administered 2012-11-23: 9 [IU] via SUBCUTANEOUS
  Administered 2012-11-24: 5 [IU] via SUBCUTANEOUS
  Administered 2012-11-24: 2 [IU] via SUBCUTANEOUS

## 2012-11-22 MED ORDER — ACETAMINOPHEN 325 MG PO TABS
650.0000 mg | ORAL_TABLET | Freq: Four times a day (QID) | ORAL | Status: DC | PRN
  Administered 2012-11-24 – 2012-11-27 (×3): 650 mg via ORAL
  Filled 2012-11-22 (×4): qty 2

## 2012-11-22 MED ORDER — METOLAZONE 5 MG PO TABS
5.0000 mg | ORAL_TABLET | Freq: Every day | ORAL | Status: DC
  Administered 2012-11-22 – 2012-11-24 (×3): 5 mg via ORAL
  Filled 2012-11-22 (×3): qty 1

## 2012-11-22 MED ORDER — ONDANSETRON HCL 4 MG PO TABS: 4.0000 mg | ORAL_TABLET | Freq: Four times a day (QID) | ORAL | Status: DC | PRN

## 2012-11-22 MED ORDER — FERROUS SULFATE 325 (65 FE) MG PO TABS
325.0000 mg | ORAL_TABLET | Freq: Every day | ORAL | Status: DC
  Administered 2012-11-22 – 2012-11-30 (×9): 325 mg via ORAL
  Filled 2012-11-22 (×10): qty 1

## 2012-11-22 MED ORDER — FAMOTIDINE 20 MG PO TABS
20.0000 mg | ORAL_TABLET | Freq: Every day | ORAL | Status: DC
  Administered 2012-11-22 – 2012-11-30 (×9): 20 mg via ORAL
  Filled 2012-11-22 (×9): qty 1

## 2012-11-22 MED ORDER — FUROSEMIDE 10 MG/ML IJ SOLN
40.0000 mg | Freq: Two times a day (BID) | INTRAMUSCULAR | Status: DC
  Administered 2012-11-22 – 2012-11-24 (×5): 40 mg via INTRAVENOUS
  Filled 2012-11-22 (×6): qty 4

## 2012-11-22 MED ORDER — HYDROCODONE-ACETAMINOPHEN 5-325 MG PO TABS
1.0000 | ORAL_TABLET | Freq: Three times a day (TID) | ORAL | Status: DC | PRN
  Administered 2012-11-24 – 2012-11-25 (×3): 1 via ORAL
  Filled 2012-11-22 (×4): qty 1

## 2012-11-22 MED ORDER — ASPIRIN EC 325 MG PO TBEC
325.0000 mg | DELAYED_RELEASE_TABLET | Freq: Every day | ORAL | Status: DC
  Administered 2012-11-22 – 2012-11-30 (×9): 325 mg via ORAL
  Filled 2012-11-22 (×9): qty 1

## 2012-11-22 MED ORDER — INSULIN ASPART PROT & ASPART (70-30 MIX) 100 UNIT/ML ~~LOC~~ SUSP
75.0000 [IU] | Freq: Two times a day (BID) | SUBCUTANEOUS | Status: DC
  Administered 2012-11-22 – 2012-11-24 (×3): 75 [IU] via SUBCUTANEOUS
  Filled 2012-11-22: qty 10

## 2012-11-22 MED ORDER — ATORVASTATIN CALCIUM 80 MG PO TABS
80.0000 mg | ORAL_TABLET | Freq: Every day | ORAL | Status: DC
  Administered 2012-11-22 – 2012-11-24 (×3): 80 mg via ORAL
  Filled 2012-11-22 (×4): qty 1

## 2012-11-22 MED ORDER — AMLODIPINE BESYLATE 10 MG PO TABS
10.0000 mg | ORAL_TABLET | Freq: Every day | ORAL | Status: DC
  Administered 2012-11-22 – 2012-11-30 (×9): 10 mg via ORAL
  Filled 2012-11-22 (×9): qty 1

## 2012-11-22 MED ORDER — ASPIRIN 325 MG PO TABS: 325.0000 mg | ORAL_TABLET | Freq: Every day | ORAL | Status: DC

## 2012-11-22 MED ORDER — METOPROLOL SUCCINATE ER 100 MG PO TB24
100.0000 mg | ORAL_TABLET | Freq: Every day | ORAL | Status: DC
  Administered 2012-11-23 – 2012-11-30 (×8): 100 mg via ORAL
  Filled 2012-11-22 (×10): qty 1

## 2012-11-22 MED ORDER — NITROGLYCERIN 0.4 MG SL SUBL: 0.4000 mg | SUBLINGUAL_TABLET | SUBLINGUAL | Status: DC | PRN

## 2012-11-22 MED ORDER — POTASSIUM CHLORIDE ER 10 MEQ PO TBCR
10.0000 meq | EXTENDED_RELEASE_TABLET | Freq: Every day | ORAL | Status: DC
  Administered 2012-11-22 – 2012-11-30 (×9): 10 meq via ORAL
  Filled 2012-11-22 (×9): qty 1

## 2012-11-22 MED ORDER — HEPARIN SODIUM (PORCINE) 5000 UNIT/ML IJ SOLN
5000.0000 [IU] | Freq: Three times a day (TID) | INTRAMUSCULAR | Status: DC
  Administered 2012-11-22 – 2012-11-30 (×24): 5000 [IU] via SUBCUTANEOUS
  Filled 2012-11-22 (×28): qty 1

## 2012-11-22 MED ORDER — ACETAMINOPHEN 650 MG RE SUPP: 650.0000 mg | Freq: Four times a day (QID) | RECTAL | Status: DC | PRN

## 2012-11-22 MED ORDER — ONDANSETRON HCL 4 MG/2ML IJ SOLN
4.0000 mg | Freq: Four times a day (QID) | INTRAMUSCULAR | Status: DC | PRN
  Administered 2012-11-22: 4 mg via INTRAVENOUS
  Filled 2012-11-22: qty 2

## 2012-11-22 MED ORDER — SODIUM CHLORIDE 0.9 % IJ SOLN
3.0000 mL | Freq: Two times a day (BID) | INTRAMUSCULAR | Status: DC
  Administered 2012-11-23 – 2012-11-29 (×7): 3 mL via INTRAVENOUS

## 2012-11-22 MED ORDER — SODIUM CHLORIDE 0.9 % IJ SOLN
3.0000 mL | Freq: Two times a day (BID) | INTRAMUSCULAR | Status: DC
  Administered 2012-11-22 – 2012-11-26 (×5): 3 mL via INTRAVENOUS

## 2012-11-22 MED ORDER — CLOPIDOGREL BISULFATE 75 MG PO TABS
75.0000 mg | ORAL_TABLET | Freq: Every day | ORAL | Status: DC
  Administered 2012-11-22: 75 mg via ORAL
  Filled 2012-11-22 (×2): qty 1

## 2012-11-22 NOTE — Progress Notes (Signed)
Received patient from ed awake oriented x3 on bed. Assisted to bed, put on telemetry. VS taken aand recorded. 02 2lnc in use. Oriented to room and call bell.SR up x3. Monitored patient.

## 2012-11-22 NOTE — Progress Notes (Signed)
Client has been assessed and moans on occasions.  Seen by physicians.  Transfusion complete.  Taken downstairs for Echocardiogram

## 2012-11-22 NOTE — ED Provider Notes (Signed)
Dr.Mcquaid from intensive care unit service called to a viral patient. He evaluated patient in emergency department as the patient can be admitted to medical service, step down bed or telemetry  Doug Sou, MD 11/22/12 0001

## 2012-11-22 NOTE — H&P (Signed)
[facility] History and Physical  Philip Strong WUJ:[id number] DOB: 10/13/1940 DOA: 11/21/2012  Referring physician: ER physician. PCP: [name], MD  Specialists: [facility] cardiology.  Chief Complaint: Shortness of breath and chest pain.  HPI: Philip Strong is a 72 y.o. male with known history of CAD status post CABG, peripheral vascular disease status post stenting, hypertension, hyperlipidemia, chronic anemia presented with complaints of shortness of breath and chest pain. Patient states he has been short of breath even at rest and off and on gets chest pressure. Denies any fever chills or any productive cough. Denies nausea vomiting abdominal pain. In the ER troponins were mildly elevated EKG showed new-onset A. fib rate controlled with nonspecific T-wave changes. Due to patient's shortness of breath and persistent chest discomfort ER physician was concerned about a dissection and CT angiogram was done which was negative for dissection. Initially critical care and cardiology was consulted and at this time critical care signed off and requested hospitalist admission. In addition patient is found to be anemic more than his usual and as per the ER physician stool for occult blood was negative and patient denies any blood in the stools or black stools. Patient has been given IV Lasix and also PRBC transfusion has been ordered. Patient will be admitted for further management.  Review of Systems: As presented in the history of presenting illness, rest negative.  Past Medical History  Diagnosis Date  . Diabetes mellitus, insulin dependent (IDDM), uncontrolled   . Hypertension   . Coronary artery disease     a. Remote MI. b. CABG 1999.  Marland Kitchen Myocardial infarct   . 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 PAD (peripheral artery disease) 05/01/2012    a. 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 b. s/p extensive LLE angioplasty and stent placement x 2 to the SFA and  angioplasty of anterior tibial and tibioperoneal trunck in 05/2012.  . Ischemic toe   . Hyperlipidemia   . Prostate ca     s/p radioactive seed implant  . CHF (congestive heart failure)    Past Surgical History  Procedure Laterality Date  . Leg amputation below knee      right  . Coronary artery bypass graft    . Right patella removal    . Right finger fracture    . Closed reduction clavicle fracture    . Angioplasty illiac artery  06/05/2012   Social History:  reports that he has been smoking.  He does not have any smokeless tobacco history on file. He reports that  drinks alcohol. He reports that he uses illicit drugs (Marijuana).  home.  where does patient live--  can do ADLs. Can patient participate in ADLs?  No Known Allergies  Family History  Problem Relation Age of Onset  . Heart attack Mother       Prior to Admission medications   Medication Sig Start Date End Date Taking? Authorizing Provider  acetaminophen (TYLENOL) 325 MG tablet Take 325 mg by mouth daily as needed for pain. For pain   Yes Historical Provider, MD  amLODipine (NORVASC) 10 MG tablet Take 10 mg by mouth daily.   Yes Historical Provider, MD  amoxicillin (AMOXIL) 500 MG capsule Take 500 mg by mouth 2 (two) times daily.   Yes Historical Provider, MD  aspirin 325 MG tablet Take 325 mg by mouth daily.   Yes Historical Provider, MD  atorvastatin (LIPITOR) 80 MG tablet Take 1 tablet (  80 mg total) by mouth daily. 08/08/12  Yes Peter M [place], MD  clopidogrel (PLAVIX) 75 MG tablet Take 1 tablet (75 mg total) by mouth daily. 06/26/12  Yes Iran Ouch, MD  ferrous sulfate 325 (65 FE) MG tablet Take 325 mg by mouth daily with breakfast.   Yes Historical Provider, MD  furosemide (LASIX) 20 MG tablet Take 20 mg by mouth every morning.    Yes Historical Provider, MD  HYDROcodone-acetaminophen (NORCO/VICODIN) 5-325 MG per tablet Take 1 tablet by mouth every 8 (eight) hours as needed for pain. For pain   Yes Historical  Provider, MD  insulin NPH-insulin regular (NOVOLIN 70/30) (70-30) 100 UNIT/ML injection Inject 75 Units into the skin 2 (two) times daily with a meal.    Yes Historical Provider, MD  lisinopril (PRINIVIL,ZESTRIL) 40 MG tablet Take 40 mg by mouth daily.   Yes Historical Provider, MD  metoprolol succinate (TOPROL-XL) 100 MG 24 hr tablet Take 100 mg by mouth daily. Take with or immediately following a meal.   Yes Historical Provider, MD  oxyCODONE-acetaminophen (PERCOCET) 10-325 MG per tablet Take 1 tablet by mouth 2 (two) times daily as needed for pain.   Yes Historical Provider, MD  potassium chloride (K-DUR) 10 MEQ tablet Take 10 mEq by mouth daily.   Yes Historical Provider, MD  prednisoLONE acetate (PRED FORTE) 1 % ophthalmic suspension Place 1 drop into both eyes 2 (two) times daily as needed. For inflammation/eye irritation   Yes Historical Provider, MD  ranitidine (ZANTAC) 150 MG tablet Take 150 mg by mouth daily.   Yes Historical Provider, MD   Physical Exam: Filed Vitals:   11/21/12 2050 11/21/12 2145 11/22/12 0141 11/22/12 0145  BP:  108/43 115/54   Pulse:  51 49   Temp: 97.6 F (36.4 C)  97.7 F (36.5 C)   TempSrc:   Oral   Resp:   16   Height:   6\' 3"  (1.905 m)   Weight:    130.6 kg (287 lb 14.7 oz)  SpO2:  100% 100%      General:   well-developed and nourished.  Eyes:  anicteric no pallor.  ENT: No discharge from the ears eyes nose mouth.  Neck:  no mass felt. JVD not appreciated.  Cardiovascular:  S1-S2 heard.  Respiratory:  no rhonchi. Crepitations heard.  Abdomen:  soft nontender bowel sounds present.  Skin:  no rash.  Musculoskeletal:  mild edema left lower extremity. Right BKA.  Psychiatric:  appears normal.  Neurologic:  alert awake oriented to time place and person. Moves all extremities.  Labs on Admission:  Basic Metabolic Panel:  Recent Labs Lab 11/21/12 2102  NA 140  K 4.1  CL 107  GLUCOSE 83  BUN 25*  CREATININE 1.70*   Liver Function  Tests: No results found for this basename: AST, ALT, ALKPHOS, BILITOT, PROT, ALBUMIN,  in the last 168 hours No results found for this basename: LIPASE, AMYLASE,  in the last 168 hours No results found for this basename: AMMONIA,  in the last 168 hours CBC:  Recent Labs Lab 11/21/12 2050 11/21/12 2102  WBC 13.4*  --   NEUTROABS 10.3*  --   HGB 7.1* 8.2*  HCT 21.3* 24.0*  MCV 63.8*  --   PLT 450*  --    Cardiac Enzymes: No results found for this basename: CKTOTAL, CKMB, CKMBINDEX, TROPONINI,  in the last 168 hours  BNP (last 3 results) No results found for this basename: PROBNP,  in  the last 8760 hours CBG: No results found for this basename: GLUCAP,  in the last 168 hours  Radiological Exams on Admission: Ct Angio Chest Pe W/cm &/or Wo Cm  11/21/2012   *RADIOLOGY REPORT*  Clinical Data:  Chest pain, back pain and shortness of breath.  CT ANGIOGRAPHY CHEST, ABDOMEN AND PELVIS  Technique:  Multidetector CT imaging through the chest, abdomen and pelvis was performed using the standard protocol during bolus administration of intravenous contrast.  Multiplanar reconstructed images including MIPs were obtained and reviewed to evaluate the vascular anatomy.  Contrast: 80mL OMNIPAQUE IOHEXOL 350 MG/ML SOLN  Comparison:   None.  CTA CHEST  Findings:  The patient has had prior CABG.  The heart is moderately enlarged.  There is evidence of florid pulmonary edema, especially in the upper lobes bilaterally.  Associated small right pleural effusion is present.  The thoracic aorta is of normal caliber and shows no evidence of aneurysmal disease or dissection.  Pulmonary arteries are also well opacified and are unremarkable in appearance.  There is no evidence of pulmonary embolism.  No pulmonary masses are seen.  There are a number of small to borderline lymph nodes identified throughout the mediastinum.  The largest lower right paratracheal lymph node near the carina measures approximately 1.8 cm in  short axis.  No enlarged axillary lymph nodes are identified.  The bony thorax is unremarkable.   Review of the MIP images confirms the above findings.  IMPRESSION:  1.  Normal thoracic aorta.  No evidence of aneurysmal disease or aortic dissection. 2.  Florid pulmonary edema with small right pleural effusion. 3.  Borderline lymph nodes throughout the mediastinum.  These may be reactive.  Consider follow-up CT of the chest.  CTA ABDOMEN AND PELVIS  Findings:  The abdominal aorta is of normal caliber. Atherosclerosis is identified involving the distal aorta and numerous branch vessels.  There is no evidence of aortic dissection.  Moderate stenosis at the origin of the celiac axis approaches 50 - 60% in estimated caliber.  The superior mesenteric artery shows scattered plaque without significant stenosis.  Bilateral single renal arteries show proximal plaque without high-grade stenosis. The inferior mesenteric artery is open.  Diffuse atherosclerosis of the bilateral iliac arteries is identified without evidence of aneurysm.  Greatest degree of stenosis is at the level of the mid left external iliac artery showing roughly 50% narrowing.  Bilateral common femoral arteries show significant plaque.  There is visible proximal occlusion of the right superficial femoral artery.  Nonvascular evaluation on the arterial phase study shows an unremarkable appearance to the solid organs of the abdomen.  No abnormal fluid collections are seen.  No evidence of masses or enlarged lymph nodes.  Bowel is unremarkable.  No hernias are seen. Bladder is within normal limits.  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  Bony structures show degenerative changes of the lower lumbar spine with fusion of the L4 and L5 vertebral bodies.   Review of the MIP images confirms the above findings.  IMPRESSION:  1.  No evidence of aortic aneurysm or dissection. 2.  Atherosclerosis evident throughout the abdomen  and pelvis. There is roughly a 50% stenosis of the left external iliac artery. There is evidence of proximal occlusion of the right superficial femoral artery.  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   Original Report Authenticated By: Irish Lack, M.D.   Ct Cta Abd/pel W/cm &/or W/o Cm  11/21/2012   *RADIOLOGY REPORT*  Clinical Data:  Chest pain, back pain and shortness of breath.  CT ANGIOGRAPHY CHEST, ABDOMEN AND PELVIS  Technique:  Multidetector CT imaging through the chest, abdomen and pelvis was performed using the standard protocol during bolus administration of intravenous contrast.  Multiplanar reconstructed images including MIPs were obtained and reviewed to evaluate the vascular anatomy.  Contrast: 80mL OMNIPAQUE IOHEXOL 350 MG/ML SOLN  Comparison:   None.  CTA CHEST  Findings:  The patient has had prior CABG.  The heart is moderately enlarged.  There is evidence of florid pulmonary edema, especially in the upper lobes bilaterally.  Associated small right pleural effusion is present.  The thoracic aorta is of normal caliber and shows no evidence of aneurysmal disease or dissection.  Pulmonary arteries are also well opacified and are unremarkable in appearance.  There is no evidence of pulmonary embolism.  No pulmonary masses are seen.  There are a number of small to borderline lymph nodes identified throughout the mediastinum.  The largest lower right paratracheal lymph node near the carina measures approximately 1.8 cm in short axis.  No enlarged axillary lymph nodes are identified.  The bony thorax is unremarkable.   Review of the MIP images confirms the above findings.  IMPRESSION:  1.  Normal thoracic aorta.  No evidence of aneurysmal disease or aortic dissection. 2.  Florid pulmonary edema with small right pleural effusion. 3.  Borderline lymph nodes throughout the mediastinum.  These may be reactive.  Consider follow-up CT of the chest.  CTA ABDOMEN AND PELVIS  Findings:  The abdominal aorta is  of normal caliber. Atherosclerosis is identified involving the distal aorta and numerous branch vessels.  There is no evidence of aortic dissection.  Moderate stenosis at the origin of the celiac axis approaches 50 - 60% in estimated caliber.  The superior mesenteric artery shows scattered plaque without significant stenosis.  Bilateral single renal arteries show proximal plaque without high-grade stenosis. The inferior mesenteric artery is open.  Diffuse atherosclerosis of the bilateral iliac arteries is identified without evidence of aneurysm.  Greatest degree of stenosis is at the level of the mid left external iliac artery showing roughly 50% narrowing.  Bilateral common femoral arteries show significant plaque.  There is visible proximal occlusion of the right superficial femoral artery.  Nonvascular evaluation on the arterial phase study shows an unremarkable appearance to the solid organs of the abdomen.  No abnormal fluid collections are seen.  No evidence of masses or enlarged lymph nodes.  Bowel is unremarkable.  No hernias are seen. Bladder is within normal limits.  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  Bony structures show degenerative changes of the lower lumbar spine with fusion of the L4 and L5 vertebral bodies.   Review of the MIP images confirms the above findings.  IMPRESSION:  1.  No evidence of aortic aneurysm or dissection. 2.  Atherosclerosis evident throughout the abdomen and pelvis. There is roughly a 50% stenosis of the left external iliac artery. There is evidence of proximal occlusion of the right superficial femoral artery.  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   Original Report Authenticated By: Irish Lack, M.D.    EKG: Independently reviewed.  atrial fibrillation with rate controlled and nonspecific ST-T changes. EKG discussed with cardiologist Dr. Terressa Koyanagi.  Assessment/Plan Principal Problem:   CHF (congestive heart failure) Active  Problems:   NSTEMI (non-ST elevated myocardial infarction)   Anemia   Atrial fibrillation   ARF (acute renal  failure)   1. Decompensated CHF - patient at this time has been placed on IV Lasix 40 mg IV every 12. Closely follow intake output and daily weights. Cycle cardiac markers. Check 2-D echo. Caution that patient is also receiving PRBC. Cardiology on board. 2. Non-ST MI - patient is on Plavix and aspirin and is also receiving transfusion. Cycle cardiac markers. Further recommendations per cardiology. 3. Anemia - chronic with acute worsening. Patient is receiving PRBC. Follow CBC after transfusion. 4. New onset atrial fibrillation rate controlled - I have discussed with cardiologist. Patient at this time is on Plavix and aspirin. Presently cardiologist has advised to hold off anticoagulants until patient's hemoglobin is stable. 5. Acute renal failure - check UA. We'll hold lisinopril for now. Closely follow intake output and metabolic panel. Patient is on Lasix and also receiving transfusion. Since patient also has anemia check SPEP and UPEP. 6. Hypertension - see #4. 7. Peripheral vascular disease status post stenting in the left leg and right BKA.    Code Status:  full code.  Family Communication:  patient's family at the bedside.  Disposition Plan:  admit to inpatient.    Elfriede Bonini N. [facility] Pager [phone].   If 7PM-7AM, please contact night-coverage [email or web address] Password [email or web address] 11/22/2012, 1:56 AM

## 2012-11-22 NOTE — Plan of Care (Signed)
Problem: Phase I Progression Outcomes Goal: EF % per last Echo/documented,Core Reminder form on chart Outcome: Completed/Met Date Met:  11/22/12 EF=45-50% per echo

## 2012-11-22 NOTE — ED Notes (Signed)
Per Dr Ethelda Chick, blood held until after Pt begins to produce urine.  Blood returned to Blood Bank

## 2012-11-22 NOTE — Discharge Summary (Addendum)
Physician Discharge Summary  Gottlieb Zuercher AOZ:[id number] DOB: [birth year] DOA: 11/21/2012  PCP: [name], MD  Admit date: 11/21/2012 Discharge date: 11/30/12  Time spent: 30 minutes  Recommendations for Outpatient Follow-up:  1.  patient has poor understanding of disease process. Home health    2. Discharge Diagnoses:  Principal Problem:   CHF (congestive heart failure) Active Problems:   NSTEMI (non-ST elevated myocardial infarction)   Anemia   Atrial fibrillation   ARF (acute renal failure)   GI bleed   Discharge Condition: Stable  Diet recommendation: Heart healthy  Filed Weights   11/22/12 0145 11/23/12 0616  Weight: 130.6 kg (287 lb 14.7 oz) 129.9 kg (286 lb 6 oz)    History of present illness:  HPI: Philip Strong is a 72 y.o. male with known history of CAD status post CABG, peripheral vascular disease status post stenting, hypertension, hyperlipidemia, chronic anemia presented with complaints of shortness of breath and chest pain. Patient states he has been short of breath even at rest and off and on gets chest pressure. Denies any fever chills or any productive cough. Denies nausea vomiting abdominal pain. In the ER troponins were mildly elevated EKG showed new-onset A. fib rate controlled with nonspecific T-wave changes. Due to patient's shortness of breath and persistent chest discomfort ER physician was concerned about a dissection and CT angiogram was done which was negative for dissection. Initially critical care and cardiology was consulted and at this time critical care signed off and requested hospitalist admission. In addition patient is found to be anemic more than his usual and as per the ER physician stool for occult blood was negative and patient denies any blood in the stools or black stools. Patient has been given IV Lasix and also PRBC transfusion has been ordered. Patient will be admitted for further management. TODAY patient unsure of what his base weight  should be, does not weigh himself daily, poor understanding of disease process (stated he thought his heart was fine now)  Hospital Course CHF; patient has poor understanding of disease process. Does not know what recommended base weight is. aggressive diuresis. Admission weight= 287lbs, base weight = 282lbs?  -lasix -education  Anemia- probably a combination of ABLA and anemia of CD; transfused 2 x unit PRBC upon admission H./H. continues to be Stable-- on Fe   NSTEMI; troponin trending down   ARF; Creatinine Decreasing- monitor now that lasix restarted   GI bleed; H./H. stable   Nicotine Dependence; patient admits to smoking a pack a day x5 years stopped 3 weeks ago. Counseled extensively on not ever smoking again secondary to his multiple medical problems   Left foot dry gangrene; patient now complaining of increased pain with past  week. Same issues have been addressed 08/20/2012 by Dr. Lorine Bears from [facility] who perform balloon angioplasty  on LT Anterior Tibial and Tibioperoneal trunk. The left SFA was diffusely  diseased and 2 self-expanding stents placed. Will consult vascular  Surgery. Left lower extremity arterial Doppler pending spoke with Dr. Arbie Cookey  (vascular surgery) who will determine course of treatment. Had angiogram on 8/6: Significant in-stent restenosis in the left SFA (90%). Patent tibial peroneal trunk and left anterior tibial artery. Successful balloon angioplasty to the left SFA.  ASA only due to heme + stools  -will need GI outpatient follow up  DM; will increase Lantus to 50 units each bedtime and continue patient's  SSI to obese/resistance scale  Foot pain; add oxycodone extended release 10 mg  twice a day; plus PRN   Procedures:  CT angiogram of chest 11/21/2012 and CT abdomen pelvis with and without contrast 11/21/2012 IMPRESSION:  1. Normal thoracic aorta. No evidence of aneurysmal disease or  aortic dissection.  2. Florid pulmonary edema  with small right pleural effusion.  3. Borderline lymph nodes throughout the mediastinum. These may  be reactive. Consider follow-up CT of the chest.   IMPRESSION:  1. No evidence of aortic aneurysm or dissection.  2. Atherosclerosis evident throughout the abdomen and pelvis.  There is roughly a 50% stenosis of the left external iliac artery.  There is evidence of proximal occlusion of the right superficial  femoral artery. 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   Echocardiogram 11/22/2012  - Left ventricle: The cavity size was mildly dilated. Wall thickness was normal. Systolic function was mildly reduced. The estimated ejection fraction was in the range of 45% to 50%. There is hypokinesis of the inferior posterior myocardium. Doppler parameters are consistent with high ventricular filling pressure. - Mitral valve: Calcified annulus. Mild regurgitation. - Left atrium: The atrium was mildly dilated. - Right ventricle: The cavity size was mildly dilated. - Right atrium: The atrium was mildly dilated. - Pulmonary arteries: Systolic pressure was mildly increased. PA peak pressure: 46mm Hg (S). Impressions:  - Technically difficult; there appears to be hypokinesis of the inferoposterior myocardium with mildly reduced LV function; suggest cardiac MR or MUGA if clinically     Consultations: Cardiology   Discharge Exam: Filed Vitals:   11/22/12 1210 11/22/12 1330 11/22/12 2028 11/23/12 0616  BP: 114/56 128/75 152/77 143/62  Pulse: 54 58 66 120  Temp: 97.6 F (36.4 C) 97.5 F (36.4 C) 98.1 F (36.7 C) 98.6 F (37 C)  TempSrc: Oral Oral Oral Oral  Resp: 18 20 22 22   Height:      Weight:    129.9 kg (286 lb 6 oz)  SpO2:  98% 99% 100%    General: Alert, claims cannot breathe Cardiovascular: Regular rhythm, tachycardic, negative murmurs rubs gallops, left PT/DP pulse nonpalpable Respiratory: Tachypnea, however SpO2= 100% on 2 L via nasal cannula  Discharge Instructions      Medication List    ASK your doctor about these medications       acetaminophen 325 MG tablet  Commonly known as:  TYLENOL  Take 325 mg by mouth daily as needed for pain. For pain     amLODipine 10 MG tablet  Commonly known as:  NORVASC  Take 10 mg by mouth daily.     amoxicillin 500 MG capsule  Commonly known as:  AMOXIL  Take 500 mg by mouth 2 (two) times daily.     aspirin 325 MG tablet  Take 325 mg by mouth daily.     atorvastatin 80 MG tablet  Commonly known as:  LIPITOR  Take 1 tablet (80 mg total) by mouth daily.     clopidogrel 75 MG tablet  Commonly known as:  PLAVIX  Take 1 tablet (75 mg total) by mouth daily.     ferrous sulfate 325 (65 FE) MG tablet  Take 325 mg by mouth daily with breakfast.     furosemide 20 MG tablet  Commonly known as:  LASIX  Take 20 mg by mouth every morning.     HYDROcodone-acetaminophen 5-325 MG per tablet  Commonly known as:  NORCO/VICODIN  Take 1 tablet by mouth every 8 (eight) hours as needed for pain. For pain     insulin NPH-regular (70-30)  100 UNIT/ML injection  Commonly known as:  NOVOLIN 70/30  Inject 75 Units into the skin 2 (two) times daily with a meal.     lisinopril 40 MG tablet  Commonly known as:  PRINIVIL,ZESTRIL  Take 40 mg by mouth daily.     metoprolol succinate 100 MG 24 hr tablet  Commonly known as:  TOPROL-XL  Take 100 mg by mouth daily. Take with or immediately following a meal.     oxyCODONE-acetaminophen 10-325 MG per tablet  Commonly known as:  PERCOCET  Take 1 tablet by mouth 2 (two) times daily as needed for pain.     potassium chloride 10 MEQ tablet  Commonly known as:  K-DUR  Take 10 mEq by mouth daily.     prednisoLONE acetate 1 % ophthalmic suspension  Commonly known as:  PRED FORTE  Place 1 drop into both eyes 2 (two) times daily as needed. For inflammation/eye irritation     ranitidine 150 MG tablet  Commonly known as:  ZANTAC  Take 150 mg by mouth daily.       No Known  Allergies    The results of significant diagnostics from this hospitalization (including imaging, microbiology, ancillary and laboratory) are listed below for reference.    Significant Diagnostic Studies: Dg Chest 2 View  11/07/2012   *RADIOLOGY REPORT*  Clinical Data: Bodyaches and shortness of breath.  CHEST - 2 VIEW  Comparison: 11/11/2008.  Findings: Cardiomegaly, similar to prior.  Status post CABG.  No change in upper mediastinal contours.  Enlarged vascular structures centrally.   Coarsened interstitial markings.  No focal consolidation, edema, effusion, pneumothorax. No significant osseous abnormality.  IMPRESSION:  Cardiomegaly and pulmonary venous congestion.   Original Report Authenticated By: Tiburcio Pea   Ct Head Wo Contrast  11/07/2012   *RADIOLOGY REPORT*  Clinical Data: Hyperglycemia; moderate headache.  CT HEAD WITHOUT CONTRAST  Technique:  Contiguous axial images were obtained from the base of the skull through the vertex without contrast.  Comparison: CT of the head performed 02/18/2004  Findings: There is no evidence of acute infarction, mass lesion, or intra- or extra-axial hemorrhage on CT.  The posterior fossa, including the cerebellum, brainstem and fourth ventricle, is within normal limits.  The third and lateral ventricles, and basal ganglia are unremarkable in appearance.  The cerebral hemispheres are symmetric in appearance, with normal gray- white differentiation.  No mass effect or midline shift is seen.  There is no evidence of fracture; visualized osseous structures are unremarkable in appearance.  The orbits are within normal limits. Mucosal thickening is noted at the left maxillary sinus, sphenoid sinus and ethmoid air cells.  The remaining paranasal sinuses and mastoid air cells are well-aerated.  No significant soft tissue abnormalities are seen.  IMPRESSION:  1.  No acute intracranial pathology seen on CT. 2.  Mucosal thickening at the left maxillary sinus, sphenoid  sinus and ethmoid air cells.   Original Report Authenticated By: Tonia Ghent, M.D.   Ct Angio Chest Pe W/cm &/or Wo Cm  11/21/2012   *RADIOLOGY REPORT*  Clinical Data:  Chest pain, back pain and shortness of breath.  CT ANGIOGRAPHY CHEST, ABDOMEN AND PELVIS  Technique:  Multidetector CT imaging through the chest, abdomen and pelvis was performed using the standard protocol during bolus administration of intravenous contrast.  Multiplanar reconstructed images including MIPs were obtained and reviewed to evaluate the vascular anatomy.  Contrast: 80mL OMNIPAQUE IOHEXOL 350 MG/ML SOLN  Comparison:   None.  CTA CHEST  Findings:  The patient has had prior CABG.  The heart is moderately enlarged.  There is evidence of florid pulmonary edema, especially in the upper lobes bilaterally.  Associated small right pleural effusion is present.  The thoracic aorta is of normal caliber and shows no evidence of aneurysmal disease or dissection.  Pulmonary arteries are also well opacified and are unremarkable in appearance.  There is no evidence of pulmonary embolism.  No pulmonary masses are seen.  There are a number of small to borderline lymph nodes identified throughout the mediastinum.  The largest lower right paratracheal lymph node near the carina measures approximately 1.8 cm in short axis.  No enlarged axillary lymph nodes are identified.  The bony thorax is unremarkable.   Review of the MIP images confirms the above findings.  IMPRESSION:  1.  Normal thoracic aorta.  No evidence of aneurysmal disease or aortic dissection. 2.  Florid pulmonary edema with small right pleural effusion. 3.  Borderline lymph nodes throughout the mediastinum.  These may be reactive.  Consider follow-up CT of the chest.  CTA ABDOMEN AND PELVIS  Findings:  The abdominal aorta is of normal caliber. Atherosclerosis is identified involving the distal aorta and numerous branch vessels.  There is no evidence of aortic dissection.  Moderate stenosis  at the origin of the celiac axis approaches 50 - 60% in estimated caliber.  The superior mesenteric artery shows scattered plaque without significant stenosis.  Bilateral single renal arteries show proximal plaque without high-grade stenosis. The inferior mesenteric artery is open.  Diffuse atherosclerosis of the bilateral iliac arteries is identified without evidence of aneurysm.  Greatest degree of stenosis is at the level of the mid left external iliac artery showing roughly 50% narrowing.  Bilateral common femoral arteries show significant plaque.  There is visible proximal occlusion of the right superficial femoral artery.  Nonvascular evaluation on the arterial phase study shows an unremarkable appearance to the solid organs of the abdomen.  No abnormal fluid collections are seen.  No evidence of masses or enlarged lymph nodes.  Bowel is unremarkable.  No hernias are seen. Bladder is within normal limits.  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  Bony structures show degenerative changes of the lower lumbar spine with fusion of the L4 and L5 vertebral bodies.   Review of the MIP images confirms the above findings.  IMPRESSION:  1.  No evidence of aortic aneurysm or dissection. 2.  Atherosclerosis evident throughout the abdomen and pelvis. There is roughly a 50% stenosis of the left external iliac artery. There is evidence of proximal occlusion of the right superficial femoral artery.  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   Original Report Authenticated By: Irish Lack, M.D.   Ct Cta Abd/pel W/cm &/or W/o Cm  11/21/2012   *RADIOLOGY REPORT*  Clinical Data:  Chest pain, back pain and shortness of breath.  CT ANGIOGRAPHY CHEST, ABDOMEN AND PELVIS  Technique:  Multidetector CT imaging through the chest, abdomen and pelvis was performed using the standard protocol during bolus administration of intravenous contrast.  Multiplanar reconstructed images including MIPs were  obtained and reviewed to evaluate the vascular anatomy.  Contrast: 80mL OMNIPAQUE IOHEXOL 350 MG/ML SOLN  Comparison:   None.  CTA CHEST  Findings:  The patient has had prior CABG.  The heart is moderately enlarged.  There is evidence of florid pulmonary edema, especially in the upper lobes bilaterally.  Associated small right pleural effusion is present.  The thoracic aorta is of normal caliber and shows no evidence of aneurysmal disease or dissection.  Pulmonary arteries are also well opacified and are unremarkable in appearance.  There is no evidence of pulmonary embolism.  No pulmonary masses are seen.  There are a number of small to borderline lymph nodes identified throughout the mediastinum.  The largest lower right paratracheal lymph node near the carina measures approximately 1.8 cm in short axis.  No enlarged axillary lymph nodes are identified.  The bony thorax is unremarkable.   Review of the MIP images confirms the above findings.  IMPRESSION:  1.  Normal thoracic aorta.  No evidence of aneurysmal disease or aortic dissection. 2.  Florid pulmonary edema with small right pleural effusion. 3.  Borderline lymph nodes throughout the mediastinum.  These may be reactive.  Consider follow-up CT of the chest.  CTA ABDOMEN AND PELVIS  Findings:  The abdominal aorta is of normal caliber. Atherosclerosis is identified involving the distal aorta and numerous branch vessels.  There is no evidence of aortic dissection.  Moderate stenosis at the origin of the celiac axis approaches 50 - 60% in estimated caliber.  The superior mesenteric artery shows scattered plaque without significant stenosis.  Bilateral single renal arteries show proximal plaque without high-grade stenosis. The inferior mesenteric artery is open.  Diffuse atherosclerosis of the bilateral iliac arteries is identified without evidence of aneurysm.  Greatest degree of stenosis is at the level of the mid left external iliac artery showing roughly 50%  narrowing.  Bilateral common femoral arteries show significant plaque.  There is visible proximal occlusion of the right superficial femoral artery.  Nonvascular evaluation on the arterial phase study shows an unremarkable appearance to the solid organs of the abdomen.  No abnormal fluid collections are seen.  No evidence of masses or enlarged lymph nodes.  Bowel is unremarkable.  No hernias are seen. Bladder is within normal limits.  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  Bony structures show degenerative changes of the lower lumbar spine with fusion of the L4 and L5 vertebral bodies.   Review of the MIP images confirms the above findings.  IMPRESSION:  1.  No evidence of aortic aneurysm or dissection. 2.  Atherosclerosis evident throughout the abdomen and pelvis. There is roughly a 50% stenosis of the left external iliac artery. There is evidence of proximal occlusion of the right superficial femoral artery.  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   Original Report Authenticated By: Irish Lack, M.D.    Microbiology: Recent Results (from the past 240 hour(s))  MRSA PCR SCREENING     Status: None   Collection Time    11/22/12  1:47 AM      Result Value Range Status   MRSA by PCR NEGATIVE  NEGATIVE Final   Comment:            The GeneXpert MRSA Assay (FDA     approved for NASAL specimens     only), is one component of a     comprehensive MRSA colonization     surveillance program. It is not     intended to diagnose MRSA     infection nor to guide or     monitor treatment for     MRSA infections.     Labs: Basic Metabolic Panel:  Recent Labs Lab 11/21/12 2102 11/22/12 0800 11/22/12 2023  NA 140 135 134*  K 4.1 5.8* 4.4  CL 107 103  103  CO2  --  17* 18*  GLUCOSE 83 77 194*  BUN 25* 30* 35*  CREATININE 1.70* 1.47* 1.73*  CALCIUM  --  8.6 8.4  MG  --   --  2.1   Liver Function Tests:  Recent Labs Lab 11/22/12 0800 11/22/12 2023  AST  2296* 2085*  ALT 1586* 1496*  ALKPHOS 310* 366*  BILITOT 0.9 0.6  PROT 8.1 8.2  ALBUMIN 2.0* 2.0*   No results found for this basename: LIPASE, AMYLASE,  in the last 168 hours No results found for this basename: AMMONIA,  in the last 168 hours CBC:  Recent Labs Lab 11/21/12 2050 11/21/12 2102 11/22/12 0800 11/22/12 2023 11/23/12 0620  WBC 13.4*  --  11.1* 8.0 8.5  NEUTROABS 10.3*  --  8.6* 6.2 6.6  HGB 7.1* 8.2* 7.7* 8.2* 8.5*  HCT 21.3* 24.0* 22.4* 24.7* 25.6*  MCV 63.8*  --  65.9* 67.9* 67.7*  PLT 450*  --  369 253 231   Cardiac Enzymes:  Recent Labs Lab 11/22/12 0800 11/22/12 1426 11/22/12 1844  TROPONINI 0.55* 0.53* 0.41*   BNP: BNP (last 3 results)  Recent Labs  11/22/12 1426  PROBNP 2633.0*   CBG:  Recent Labs Lab 11/22/12 1201 11/22/12 1616 11/22/12 2212 11/23/12 0614 11/23/12 0634  GLUCAP 148* 208* 147* 45* 72       Signed:  Marlin Canary  [facility] 11/23/2012, 7:17 AM

## 2012-11-22 NOTE — Progress Notes (Signed)
Subjective: Patient denies CP  Is nauseated this AM  Breathing is still short. Objective: Filed Vitals:   11/22/12 0315 11/22/12 0415 11/22/12 0515 11/22/12 0615  BP: 129/66 130/73 128/73 126/72  Pulse: 47 55 51 52  Temp: 97.6 F (36.4 C) 97 F (36.1 C) 97.5 F (36.4 C) 97.6 F (36.4 C)  TempSrc: Oral Oral Oral Oral  Resp: 20 20 18 18   Height:      Weight:      SpO2:       Weight change:   Intake/Output Summary (Last 24 hours) at 11/22/12 0846 Last data filed at 11/22/12 0830  Gross per 24 hour  Intake 1075.5 ml  Output    300 ml  Net  775.5 ml    General: Alert, awake, oriented x3, Nauseated Neck:  Neck full Heart: Regular rate and rhythm, without murmurs, rubs, gallops.  Lungs: Clear to auscultation.  No rales or wheezes. Exemities: s/p R BKA  No signif edema on L Neuro: Grossly intact, nonfocal.  Tele:  SR Lab Results: Results for orders placed during the hospital encounter of 11/21/12 (from the past 24 hour(s))  CBC WITH DIFFERENTIAL     Status: Abnormal   Collection Time    11/21/12  8:50 PM      Result Value Range   WBC 13.4 (*) 4.0 - 10.5 K/uL   RBC 3.34 (*) 4.22 - 5.81 MIL/uL   Hemoglobin 7.1 (*) 13.0 - 17.0 g/dL   HCT 16.1 (*) 09.6 - 04.5 %   MCV 63.8 (*) 78.0 - 100.0 fL   MCH 21.3 (*) 26.0 - 34.0 pg   MCHC 33.3  30.0 - 36.0 g/dL   RDW 40.9 (*) 81.1 - 91.4 %   Platelets 450 (*) 150 - 400 K/uL   Neutrophils Relative % 77  43 - 77 %   Lymphocytes Relative 17  12 - 46 %   Monocytes Relative 6  3 - 12 %   Eosinophils Relative 0  0 - 5 %   Basophils Relative 0  0 - 1 %   Neutro Abs 10.3 (*) 1.7 - 7.7 K/uL   Lymphs Abs 2.3  0.7 - 4.0 K/uL   Monocytes Absolute 0.8  0.1 - 1.0 K/uL   Eosinophils Absolute 0.0  0.0 - 0.7 K/uL   Basophils Absolute 0.0  0.0 - 0.1 K/uL   RBC Morphology POLYCHROMASIA PRESENT     Smear Review       Value: PLATELET CLUMPS NOTED ON SMEAR, COUNT APPEARS ADEQUATE  D-DIMER, QUANTITATIVE     Status: Abnormal   Collection Time   11/21/12  8:50 PM      Result Value Range   D-Dimer, Quant 2.83 (*) 0.00 - 0.48 ug/mL-FEU  POCT I-STAT TROPONIN I     Status: Abnormal   Collection Time    11/21/12  8:57 PM      Result Value Range   Troponin i, poc 0.42 (*) 0.00 - 0.08 ng/mL   Comment NOTIFIED PHYSICIAN     Comment 3           POCT I-STAT, CHEM 8     Status: Abnormal   Collection Time    11/21/12  9:02 PM      Result Value Range   Sodium 140  135 - 145 mEq/L   Potassium 4.1  3.5 - 5.1 mEq/L   Chloride 107  96 - 112 mEq/L   BUN 25 (*) 6 - 23 mg/dL   Creatinine,  Ser 1.70 (*) 0.50 - 1.35 mg/dL   Glucose, Bld 83  70 - 99 mg/dL   Calcium, Ion 1.61  0.96 - 1.30 mmol/L   TCO2 18  0 - 100 mmol/L   Hemoglobin 8.2 (*) 13.0 - 17.0 g/dL   HCT 04.5 (*) 40.9 - 81.1 %  PREPARE RBC (CROSSMATCH)     Status: None   Collection Time    11/21/12 10:30 PM      Result Value Range   Order Confirmation ORDER PROCESSED BY BLOOD BANK    TYPE AND SCREEN     Status: None   Collection Time    11/21/12 10:30 PM      Result Value Range   ABO/RH(D) B POS     Antibody Screen NEG     Sample Expiration 11/24/2012     Unit Number [id number]     Blood Component Type RED CELLS,LR     Unit division 00     Status of Unit ISSUED     Transfusion Status OK TO TRANSFUSE     Crossmatch Result Compatible     Unit Number [id number]     Blood Component Type RED CELLS,LR     Unit division 00     Status of Unit ALLOCATED     Transfusion Status OK TO TRANSFUSE     Crossmatch Result Compatible    MRSA PCR SCREENING     Status: None   Collection Time    11/22/12  1:47 AM      Result Value Range   MRSA by PCR NEGATIVE  NEGATIVE  GLUCOSE, CAPILLARY     Status: Abnormal   Collection Time    11/22/12  1:57 AM      Result Value Range   Glucose-Capillary 65 (*) 70 - 99 mg/dL   Comment 1 Notify RN    GLUCOSE, CAPILLARY     Status: Abnormal   Collection Time    11/22/12  6:00 AM      Result Value Range   Glucose-Capillary 100 (*) 70 - 99  mg/dL   Comment 1 Notify RN    CBC WITH DIFFERENTIAL     Status: Abnormal (Preliminary result)   Collection Time    11/22/12  8:00 AM      Result Value Range   WBC 11.1 (*) 4.0 - 10.5 K/uL   RBC 3.40 (*) 4.22 - 5.81 MIL/uL   Hemoglobin 7.7 (*) 13.0 - 17.0 g/dL   HCT 84.1 (*) 32.4 - 40.1 %   MCV 65.9 (*) 78.0 - 100.0 fL   MCH 22.6 (*) 26.0 - 34.0 pg   MCHC 34.4  30.0 - 36.0 g/dL   RDW 02.7 (*) 25.3 - 66.4 %   Platelets 369  150 - 400 K/uL   Neutrophils Relative % PENDING  43 - 77 %   Neutro Abs PENDING  1.7 - 7.7 K/uL   Band Neutrophils PENDING  0 - 10 %   Lymphocytes Relative PENDING  12 - 46 %   Lymphs Abs PENDING  0.7 - 4.0 K/uL   Monocytes Relative PENDING  3 - 12 %   Monocytes Absolute PENDING  0.1 - 1.0 K/uL   Eosinophils Relative PENDING  0 - 5 %   Eosinophils Absolute PENDING  0.0 - 0.7 K/uL   Basophils Relative PENDING  0 - 1 %   Basophils Absolute PENDING  0.0 - 0.1 K/uL   WBC Morphology PENDING     RBC Morphology PENDING  Smear Review PENDING     nRBC PENDING  0 /100 WBC   Metamyelocytes Relative PENDING     Myelocytes PENDING     Promyelocytes Absolute PENDING     Blasts PENDING      Studies/Results: @RISRSLT24 @  Medications: Reviewed   @PROBHOSP @  1.  CHF  Will need to diurese after transfusions.  Echo today.  Check BNP   2.  CAD  Remote CABG 1999. Mild increase in trop May be exacerbated by anemia.  Transfuse.  Diurese  Get echo Would   3.  DM    4.  PVOD    5.  HL  LDL was 115  LOS: 1 day   Dietrich Pates 11/22/2012, 8:46 AM

## 2012-11-22 NOTE — Progress Notes (Signed)
Echocardiogram 2D Echocardiogram has been performed.  Philip Strong 11/22/2012, 12:41 PM

## 2012-11-22 NOTE — ED Provider Notes (Signed)
I have personally seen and examined the patient.  I have discussed the plan of care with the resident.  I have reviewed the documentation on PMH/FH/Soc. History.  I have reviewed the documentation of the resident and agree.  Doug Sou, MD 11/22/12 [phone]

## 2012-11-23 ENCOUNTER — Inpatient Hospital Stay (HOSPITAL_COMMUNITY): Payer: Medicare Other

## 2012-11-23 DIAGNOSIS — F1210 Cannabis abuse, uncomplicated: Secondary | ICD-10-CM | POA: Diagnosis present

## 2012-11-23 DIAGNOSIS — K922 Gastrointestinal hemorrhage, unspecified: Secondary | ICD-10-CM | POA: Diagnosis present

## 2012-11-23 DIAGNOSIS — F17200 Nicotine dependence, unspecified, uncomplicated: Secondary | ICD-10-CM | POA: Diagnosis present

## 2012-11-23 DIAGNOSIS — E785 Hyperlipidemia, unspecified: Secondary | ICD-10-CM

## 2012-11-23 LAB — COMPREHENSIVE METABOLIC PANEL
BUN: 33 mg/dL — ABNORMAL HIGH (ref 6–23)
Calcium: 8.6 mg/dL (ref 8.4–10.5)
GFR calc Af Amer: 49 mL/min — ABNORMAL LOW (ref >90)
Glucose, Bld: 48 mg/dL — ABNORMAL LOW (ref 70–99)
Sodium: 138 mEq/L (ref 135–145)
Total Protein: 8 g/dL (ref 6.0–8.3)

## 2012-11-23 LAB — TYPE AND SCREEN
Antibody Screen: NEGATIVE
Unit division: 0

## 2012-11-23 LAB — CBC WITH DIFFERENTIAL/PLATELET
Eosinophils Absolute: 0.1 10*3/uL (ref 0.0–0.7)
HCT: 25.6 % — ABNORMAL LOW (ref 39.0–52.0)
Hemoglobin: 8.5 g/dL — ABNORMAL LOW (ref 13.0–17.0)
Lymphs Abs: 1.3 10*3/uL (ref 0.7–4.0)
MCH: 22.5 pg — ABNORMAL LOW (ref 26.0–34.0)
MCV: 67.7 fL — ABNORMAL LOW (ref 78.0–100.0)
Monocytes Relative: 6 % (ref 3–12)
Neutrophils Relative %: 77 % (ref 43–77)
RBC: 3.78 MIL/uL — ABNORMAL LOW (ref 4.22–5.81)

## 2012-11-23 LAB — GLUCOSE, CAPILLARY
Glucose-Capillary: 280 mg/dL — ABNORMAL HIGH (ref 70–99)
Glucose-Capillary: 324 mg/dL — ABNORMAL HIGH (ref 70–99)

## 2012-11-23 LAB — MAGNESIUM: Magnesium: 2.1 mg/dL (ref 1.5–2.5)

## 2012-11-23 LAB — PRO B NATRIURETIC PEPTIDE: Pro B Natriuretic peptide (BNP): 3413 pg/mL — ABNORMAL HIGH (ref 0–125)

## 2012-11-23 NOTE — Progress Notes (Signed)
[facility] PROGRESS NOTE  Philip Strong UUV:[id number] DOB: [birth year] DOA: 11/21/2012 PCP: [name], MD  Assessment/Plan: 1. CHF; patient has poor understanding of disease process. Does not know what recommended base weight is. We'll aggressively diuresis. Admission weight= 287lbs, current weight= 286lbs obtain echocardiogram. patient and informed him prior to discharge what his base weight should be. Patient to followup with his cardiologist 2. Anemia; transfused 2 x unit PRBC obtained post transfusion CBC, CMP 3. NSTEMI; cardiology on board and feels the bump in troponin secondary to his anemia. No mention of [place] of NSTEMI 4. ARF; we'll have to monitor closely as we diuresis patient 5. GI bleed; patient is guaiac positive, will contact GI in a.m. and initiate workup (want to ensure CHF stable first) 6. Nicotine Dependence; patient admits to smoking a pack a day x5 years stopped 3 weeks ago 7. Drug abuse; obtain urinary tox screen       8. Left foot dry gangrene; patient now complaining of increased pain with past            week. Same issues have been addressed  08/20/2012 by Dr. Lorine Bears from  [facility]  who perform balloon angioplasty             on LT Anterior Tibial and Tibioperoneal trunk. The left SFA was diffusely             diseased and 2 self-expanding stents placed. Will consult vascular               Surgery. Left lower extremity arterial Doppler pending  Code Status: Full Family Communication:  Disposition Plan:    Consultants: Cardiology  Procedures:  Echocardiogram 11/22/2012 - Left ventricle: mildly dilated. LVEF=  45% to 50%., hypokinesis of the inferior posterior myocardium. Doppler parameters are consistent with high ventricular filling pressure. - Mitral valve: Calcified annulus. Mild regurgitation. - Left atrium: The atrium was mildly dilated. - Right ventricle: The cavity size was mildly dilated. - Right atrium: The  atrium was mildly dilated. - Pulmonary arteries: Systolic pressure was mildly increased. PA peak pressure: 46mm Hg (S). Impressions:   08/20/2012 Dr. Lorine Bears from  [facility]    He was seen in January of this year rest pain involving the left leg with toe ulceration and early gangrenous changes. He underwent angiography which showed severe diffuse mid left SFA disease as well as one-vessel runoff below the knee with 99% stenosis in the anterior tibial artery which was the only artery supplying the foot.  He underwent a staged endovascular intervention on the left lower extremity under general anesthesia due to inability to safely sedate him during his diagnostic angiography. The procedure was done via an antegrade access of the left femoral artery which was extremely difficult due to his obesity. I was able to perform balloon angioplasty on the left anterior tibial and tibioperoneal trunk. The left SFA was diffusely diseased and I placed 2 self-expanding stents.    Antibiotics:   HPI/Subjective: HPI: Philip Strong is a 72 y.o. male with known history of CAD status post CABG, peripheral vascular disease status post stenting, hypertension, hyperlipidemia, chronic anemia presented with complaints of shortness of breath and chest pain. Patient states he has been short of breath even at rest and off and on gets chest pressure. Denies any fever chills or any productive cough. Denies nausea vomiting abdominal pain. In the ER troponins were mildly elevated  EKG showed new-onset A. fib rate controlled with nonspecific T-wave changes. Due to patient's shortness of breath and persistent chest discomfort ER physician was concerned about a dissection and CT angiogram was done which was negative for dissection. Initially critical care and cardiology was consulted and at this time critical care signed off and requested hospitalist admission. In addition patient is found to be anemic more than his usual  and as per the ER physician stool for occult blood was negative and patient denies any blood in the stools or black stools. Patient has been given IV Lasix and also PRBC transfusion has been ordered. Patient will be admitted for further management. TODAY patient unsure of what his base weight should be, does not weigh himself daily, poor understanding of disease process (stated he thought his heart was fine now). Also states that this year (2014) had vascular surgery with a placed a stent in his left lower leg patient complaining of 2 weeks of increased foot pain as well as blackening of the toes   Objective: Filed Vitals:   11/22/12 2028 11/23/12 0616 11/23/12 1105 11/23/12 1449  BP: 152/77 143/62 137/54 139/58  Pulse: 66 120  51  Temp: 98.1 F (36.7 C) 98.6 F (37 C)  97.2 F (36.2 C)  TempSrc: Oral Oral  Oral  Resp: 22 22  21   Height:      Weight:  129.9 kg (286 lb 6 oz)    SpO2: 99% 100%  100%    Intake/Output Summary (Last 24 hours) at 11/23/12 1718 Last data filed at 11/23/12 1128  Gross per 24 hour  Intake    480 ml  Output   1800 ml  Net  -1320 ml   Filed Weights   11/22/12 0145 11/23/12 0616  Weight: 130.6 kg (287 lb 14.7 oz) 129.9 kg (286 lb 6 oz)    Exam:   General: Alert,NAD  Cardiovascular: Regular rhythm and rate, negative murmurs rubs gallops, left PT/DP pulse nonpalpable  Respiratory: Clear to auscultation bilateral  Abdomen: Soft nontender nondistended plus bowel sounds  Musculoskeletal: Left foot appears to have dry gangrene in all 5 metatarsals, extending proximally to the ankle   Data Reviewed: Basic Metabolic Panel:  Recent Labs Lab 11/21/12 2102 11/22/12 0800 11/22/12 2023 11/23/12 0620  NA 140 135 134* 138  K 4.1 5.8* 4.4 3.8  CL 107 103 103 104  CO2  --  17* 18* 19  GLUCOSE 83 77 194* 48*  BUN 25* 30* 35* 33*  CREATININE 1.70* 1.47* 1.73* 1.58*  CALCIUM  --  8.6 8.4 8.6  MG  --   --  2.1 2.1   Liver Function Tests:  Recent  Labs Lab 11/22/12 0800 11/22/12 2023 11/23/12 0620  AST 2296* 2085* 1182*  ALT 1586* 1496* 1294*  ALKPHOS 310* 366* 347*  BILITOT 0.9 0.6 0.7  PROT 8.1 8.2 8.0  ALBUMIN 2.0* 2.0* 2.1*   No results found for this basename: LIPASE, AMYLASE,  in the last 168 hours No results found for this basename: AMMONIA,  in the last 168 hours CBC:  Recent Labs Lab 11/21/12 2050 11/21/12 2102 11/22/12 0800 11/22/12 2023 11/23/12 0620  WBC 13.4*  --  11.1* 8.0 8.5  NEUTROABS 10.3*  --  8.6* 6.2 6.6  HGB 7.1* 8.2* 7.7* 8.2* 8.5*  HCT 21.3* 24.0* 22.4* 24.7* 25.6*  MCV 63.8*  --  65.9* 67.9* 67.7*  PLT 450*  --  369 253 231   Cardiac Enzymes:  Recent Labs Lab  11/22/12 0800 11/22/12 1426 11/22/12 1844  TROPONINI 0.55* 0.53* 0.41*   BNP (last 3 results)  Recent Labs  11/22/12 1426  PROBNP 2633.0*   CBG:  Recent Labs Lab 11/22/12 2212 11/23/12 0614 11/23/12 0634 11/23/12 1115 11/23/12 1601  GLUCAP 147* 45* 72 280* 324*    Recent Results (from the past 240 hour(s))  MRSA PCR SCREENING     Status: None   Collection Time    11/22/12  1:47 AM      Result Value Range Status   MRSA by PCR NEGATIVE  NEGATIVE Final   Comment:            The GeneXpert MRSA Assay (FDA     approved for NASAL specimens     only), is one component of a     comprehensive MRSA colonization     surveillance program. It is not     intended to diagnose MRSA     infection nor to guide or     monitor treatment for     MRSA infections.     Studies: Dg Chest 2 View  11/23/2012   *RADIOLOGY REPORT*  Clinical Data: CHF.  CHEST - 2 VIEW  Comparison: 11/06/2012  Findings: Mild to moderate cardiomegaly.  Diffuse interstitial and alveolar opacities compatible with CHF.  Prior CABG.  No effusions. No acute bony abnormality.  IMPRESSION: Mild CHF.   Original Report Authenticated By: Charlett Nose, M.D.   Ct Angio Chest Pe W/cm &/or Wo Cm  11/21/2012   *RADIOLOGY REPORT*  Clinical Data:  Chest pain, back  pain and shortness of breath.  CT ANGIOGRAPHY CHEST, ABDOMEN AND PELVIS  Technique:  Multidetector CT imaging through the chest, abdomen and pelvis was performed using the standard protocol during bolus administration of intravenous contrast.  Multiplanar reconstructed images including MIPs were obtained and reviewed to evaluate the vascular anatomy.  Contrast: 80mL OMNIPAQUE IOHEXOL 350 MG/ML SOLN  Comparison:   None.  CTA CHEST  Findings:  The patient has had prior CABG.  The heart is moderately enlarged.  There is evidence of florid pulmonary edema, especially in the upper lobes bilaterally.  Associated small right pleural effusion is present.  The thoracic aorta is of normal caliber and shows no evidence of aneurysmal disease or dissection.  Pulmonary arteries are also well opacified and are unremarkable in appearance.  There is no evidence of pulmonary embolism.  No pulmonary masses are seen.  There are a number of small to borderline lymph nodes identified throughout the mediastinum.  The largest lower right paratracheal lymph node near the carina measures approximately 1.8 cm in short axis.  No enlarged axillary lymph nodes are identified.  The bony thorax is unremarkable.   Review of the MIP images confirms the above findings.  IMPRESSION:  1.  Normal thoracic aorta.  No evidence of aneurysmal disease or aortic dissection. 2.  Florid pulmonary edema with small right pleural effusion. 3.  Borderline lymph nodes throughout the mediastinum.  These may be reactive.  Consider follow-up CT of the chest.  CTA ABDOMEN AND PELVIS  Findings:  The abdominal aorta is of normal caliber. Atherosclerosis is identified involving the distal aorta and numerous branch vessels.  There is no evidence of aortic dissection.  Moderate stenosis at the origin of the celiac axis approaches 50 - 60% in estimated caliber.  The superior mesenteric artery shows scattered plaque without significant stenosis.  Bilateral single renal  arteries show proximal plaque without high-grade stenosis. The inferior mesenteric  artery is open.  Diffuse atherosclerosis of the bilateral iliac arteries is identified without evidence of aneurysm.  Greatest degree of stenosis is at the level of the mid left external iliac artery showing roughly 50% narrowing.  Bilateral common femoral arteries show significant plaque.  There is visible proximal occlusion of the right superficial femoral artery.  Nonvascular evaluation on the arterial phase study shows an unremarkable appearance to the solid organs of the abdomen.  No abnormal fluid collections are seen.  No evidence of masses or enlarged lymph nodes.  Bowel is unremarkable.  No hernias are seen. Bladder is within normal limits.  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  Bony structures show degenerative changes of the lower lumbar spine with fusion of the L4 and L5 vertebral bodies.   Review of the MIP images confirms the above findings.  IMPRESSION:  1.  No evidence of aortic aneurysm or dissection. 2.  Atherosclerosis evident throughout the abdomen and pelvis. There is roughly a 50% stenosis of the left external iliac artery. There is evidence of proximal occlusion of the right superficial femoral artery.  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   Original Report Authenticated By: Irish Lack, M.D.   Ct Cta Abd/pel W/cm &/or W/o Cm  11/21/2012   *RADIOLOGY REPORT*  Clinical Data:  Chest pain, back pain and shortness of breath.  CT ANGIOGRAPHY CHEST, ABDOMEN AND PELVIS  Technique:  Multidetector CT imaging through the chest, abdomen and pelvis was performed using the standard protocol during bolus administration of intravenous contrast.  Multiplanar reconstructed images including MIPs were obtained and reviewed to evaluate the vascular anatomy.  Contrast: 80mL OMNIPAQUE IOHEXOL 350 MG/ML SOLN  Comparison:   None.  CTA CHEST  Findings:  The patient has had prior CABG.  The  heart is moderately enlarged.  There is evidence of florid pulmonary edema, especially in the upper lobes bilaterally.  Associated small right pleural effusion is present.  The thoracic aorta is of normal caliber and shows no evidence of aneurysmal disease or dissection.  Pulmonary arteries are also well opacified and are unremarkable in appearance.  There is no evidence of pulmonary embolism.  No pulmonary masses are seen.  There are a number of small to borderline lymph nodes identified throughout the mediastinum.  The largest lower right paratracheal lymph node near the carina measures approximately 1.8 cm in short axis.  No enlarged axillary lymph nodes are identified.  The bony thorax is unremarkable.   Review of the MIP images confirms the above findings.  IMPRESSION:  1.  Normal thoracic aorta.  No evidence of aneurysmal disease or aortic dissection. 2.  Florid pulmonary edema with small right pleural effusion. 3.  Borderline lymph nodes throughout the mediastinum.  These may be reactive.  Consider follow-up CT of the chest.  CTA ABDOMEN AND PELVIS  Findings:  The abdominal aorta is of normal caliber. Atherosclerosis is identified involving the distal aorta and numerous branch vessels.  There is no evidence of aortic dissection.  Moderate stenosis at the origin of the celiac axis approaches 50 - 60% in estimated caliber.  The superior mesenteric artery shows scattered plaque without significant stenosis.  Bilateral single renal arteries show proximal plaque without high-grade stenosis. The inferior mesenteric artery is open.  Diffuse atherosclerosis of the bilateral iliac arteries is identified without evidence of aneurysm.  Greatest degree of stenosis is at the level of the mid left external iliac artery showing roughly 50% narrowing.  Bilateral common femoral arteries show significant plaque.  There is visible proximal occlusion of the right superficial femoral artery.  Nonvascular evaluation on the  arterial phase study shows an unremarkable appearance to the solid organs of the abdomen.  No abnormal fluid collections are seen.  No evidence of masses or enlarged lymph nodes.  Bowel is unremarkable.  No hernias are seen. Bladder is within normal limits.  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  Bony structures show degenerative changes of the lower lumbar spine with fusion of the L4 and L5 vertebral bodies.   Review of the MIP images confirms the above findings.  IMPRESSION:  1.  No evidence of aortic aneurysm or dissection. 2.  Atherosclerosis evident throughout the abdomen and pelvis. There is roughly a 50% stenosis of the left external iliac artery. There is evidence of proximal occlusion of the right superficial femoral artery.  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   Original Report Authenticated By: Irish Lack, M.D.    Scheduled Meds: . amLODipine  10 mg Oral Daily  . aspirin EC  325 mg Oral Daily  . atorvastatin  80 mg Oral Daily  . famotidine  20 mg Oral Daily  . ferrous sulfate  325 mg Oral Q breakfast  . furosemide  40 mg Intravenous Q12H  . heparin  5,000 Units Subcutaneous Q8H  . insulin aspart  0-9 Units Subcutaneous TID WC  . insulin aspart protamine- aspart  75 Units Subcutaneous BID WC  . metolazone  5 mg Oral Daily  . metoprolol succinate  100 mg Oral Q breakfast  . potassium chloride  10 mEq Oral Daily  . sodium chloride  3 mL Intravenous Q12H  . sodium chloride  3 mL Intravenous Q12H   Continuous Infusions:   Principal Problem:   CHF (congestive heart failure) Active Problems:   NSTEMI (non-ST elevated myocardial infarction)   Anemia   Atrial fibrillation   ARF (acute renal failure)   GI bleed   Nicotine dependence   Drug abuse, marijuana    Time spent: 60 minutes    WOODS, CURTIS, J  [facility] Pager [phone]. If 7PM-7AM, please contact night-coverage at [email or web address], password [facility] 11/23/2012, 5:18 PM   LOS: 2 days

## 2012-11-23 NOTE — Progress Notes (Signed)
Pt's BG=45 at [phone]; pt asymptomatic, sitting at the side of the bed. 2 cups of orange juice given, @ 0634, pt's BG=72. Patient given 1 cup of orange juice. Will continue to monitor.

## 2012-11-23 NOTE — Progress Notes (Signed)
Subjective:  Complains of pain in his left foot that is been present for around 3 weeks. Not too short of breath this morning no chest pain. He is concerned about the appearance of his feet.  Objective:  Vital Signs in the last 24 hours: BP 137/54  Pulse 120  Temp(Src) 98.6 F (37 C) (Oral)  Resp 22  Ht 6\' 3"  (1.905 m)  Wt 129.9 kg (286 lb 6 oz)  BMI 35.79 kg/m2  SpO2 100%  Physical Exam: Pleasant white male in no acute distress complaining of foot pain.  Lungs:  Clear Cardiac:  Regular rhythm, normal S1 and S2, no S3 Abdomen:  Soft, nontender, no masses Extremities: 1+ edema present, pedal pulses are diminished.  Intake/Output from previous day: 08/01 0701 - 08/02 0700 In: 1624.2 [P.O.:840; I.V.:400; Blood:384.2] Out: 1250 [Urine:1250] Weight Filed Weights   11/22/12 0145 11/23/12 0616  Weight: 130.6 kg (287 lb 14.7 oz) 129.9 kg (286 lb 6 oz)    Lab Results: Basic Metabolic Panel:  Recent Labs  16/10/96 2023 11/23/12 0620  NA 134* 138  K 4.4 3.8  CL 103 104  CO2 18* 19  GLUCOSE 194* 48*  BUN 35* 33*  CREATININE 1.73* 1.58*    CBC:  Recent Labs  11/22/12 2023 11/23/12 0620  WBC 8.0 8.5  NEUTROABS 6.2 6.6  HGB 8.2* 8.5*  HCT 24.7* 25.6*  MCV 67.9* 67.7*  PLT 253 231    BNP    Component Value Date/Time   PROBNP 2633.0* 11/22/2012 1426    Telemetry: Sinus rhythm  Assessment/Plan:  1. Chronic heart failure-predominantly diastolic 2. Coronary artery disease with previous bypass grafting 3. Peripheral vascular disease with rest pain in left leg 4. Severe anemia  Recommendations:  He is diuresing well. His BNP is only mildly elevated. He is complaining of ischemic pain in his left foot and would recommend Doppler evaluation as well as vascular surgery evaluation today.   [name]  MD [facility]  11/23/2012, 11:32 AM

## 2012-11-23 NOTE — Progress Notes (Signed)
CRITICAL VALUE ALERT  Critical value received:  BG=45  Date of notification: 11/23/12  Time of notification:  0614  Critical value read back:yes  Nurse who received alert:  G. Emina Ribaudo  MD notified (1st page):  M. Burnadette Peter NP  Time of first page:  0618  MD notified (2nd page):  Time of second page:  Responding MD:    Time MD responded:

## 2012-11-23 NOTE — Progress Notes (Addendum)
Instructions related to foot care reviewed with patient.  Client puts lotion between toes, and as a diabetic should keep area clean and dry.  1209 Client in route from xray, had 25 beat run of VTach.  Dr. Joseph Art notified.  Checked phone for message and did not receive one from telemetry.  Notified them that message had not reached me.  Xray did not call with client being symptomatic of heart rythmn

## 2012-11-23 NOTE — Progress Notes (Signed)
CRITICAL VALUE ALERT  Critical value received:  1426  Date of notification:  11/22/12  Time of notification:  1426  Critical value read back:yes Troponin 0.53  Nurse who received alert:  Joycie Peek  MD notified (1st page):  Dr. Joseph Art  Time of first page:  1427  MD notified (2nd page):  Time of second page:  Responding MD: Dr. Joseph Art  Time MD responded:  [phone]

## 2012-11-24 DIAGNOSIS — I70269 Atherosclerosis of native arteries of extremities with gangrene, unspecified extremity: Secondary | ICD-10-CM

## 2012-11-24 DIAGNOSIS — K922 Gastrointestinal hemorrhage, unspecified: Secondary | ICD-10-CM

## 2012-11-24 LAB — CBC WITH DIFFERENTIAL/PLATELET
Basophils Absolute: 0 10*3/uL (ref 0.0–0.1)
Lymphs Abs: 1.7 10*3/uL (ref 0.7–4.0)
MCH: 22.5 pg — ABNORMAL LOW (ref 26.0–34.0)
MCV: 68.8 fL — ABNORMAL LOW (ref 78.0–100.0)
Monocytes Absolute: 1 10*3/uL (ref 0.1–1.0)
Platelets: 182 10*3/uL (ref 150–400)
RDW: 21.7 % — ABNORMAL HIGH (ref 11.5–15.5)

## 2012-11-24 LAB — COMPREHENSIVE METABOLIC PANEL
ALT: 907 U/L — ABNORMAL HIGH (ref 0–53)
AST: 363 U/L — ABNORMAL HIGH (ref 0–37)
Albumin: 2 g/dL — ABNORMAL LOW (ref 3.5–5.2)
Alkaline Phosphatase: 318 U/L — ABNORMAL HIGH (ref 39–117)
BUN: 39 mg/dL — ABNORMAL HIGH (ref 6–23)
Chloride: 101 mEq/L (ref 96–112)
Potassium: 3.9 mEq/L (ref 3.5–5.1)
Total Bilirubin: 0.5 mg/dL (ref 0.3–1.2)

## 2012-11-24 LAB — GLUCOSE, CAPILLARY
Glucose-Capillary: 72 mg/dL (ref 70–99)
Glucose-Capillary: 299 mg/dL — ABNORMAL HIGH (ref 70–99)

## 2012-11-24 MED ORDER — INSULIN ASPART 100 UNIT/ML ~~LOC~~ SOLN
0.0000 [IU] | SUBCUTANEOUS | Status: DC
  Administered 2012-11-24: 15 [IU] via SUBCUTANEOUS
  Administered 2012-11-25: 3 [IU] via SUBCUTANEOUS
  Administered 2012-11-25: 7 [IU] via SUBCUTANEOUS
  Administered 2012-11-25: 11 [IU] via SUBCUTANEOUS
  Administered 2012-11-26: 20 [IU] via SUBCUTANEOUS
  Administered 2012-11-26: 15 [IU] via SUBCUTANEOUS
  Administered 2012-11-26: 20 [IU] via SUBCUTANEOUS
  Administered 2012-11-26: 4 [IU] via SUBCUTANEOUS
  Administered 2012-11-26: 7 [IU] via SUBCUTANEOUS
  Administered 2012-11-27: 15 [IU] via SUBCUTANEOUS
  Administered 2012-11-27: 7 [IU] via SUBCUTANEOUS
  Administered 2012-11-27 – 2012-11-28 (×2): 4 [IU] via SUBCUTANEOUS
  Administered 2012-11-28: 7 [IU] via SUBCUTANEOUS
  Administered 2012-11-28: 4 [IU] via SUBCUTANEOUS
  Administered 2012-11-28: 21:00:00 15 [IU] via SUBCUTANEOUS
  Administered 2012-11-29: 7 [IU] via SUBCUTANEOUS
  Administered 2012-11-29: 4 [IU] via SUBCUTANEOUS

## 2012-11-24 NOTE — Progress Notes (Signed)
[facility] PROGRESS NOTE  Philip Strong UEA:[id number] DOB: 1940/11/15 DOA: 11/21/2012 PCP: [name], MD  Assessment/Plan: 1. CHF; patient has poor understanding of disease process. Does not know what recommended base weight is. We'll aggressively diuresis. Admission weight= 287lbs, current weight= 282lbs (will use this as his base weight; creatinine beginning to creep up) 2. Anemia; transfused 2 x unit PRBC upon admission H./H. stable 3. NSTEMI; has been ruled out 4. ARF; he DC'd Lasix and Zaroxolyn continue to monitor patient's weight, creatinine 5. GI bleed; patient is guaiac positive, however patient's H./H. is stable therefore will not ontact GI  6. Nicotine Dependence; patient admits to smoking a pack a day x5 years stopped 3 weeks ago 7. Drug abuse; obtain urinary tox screen       8. Left foot dry gangrene; patient now complaining of increased pain with past            week. Same issues have been addressed  08/20/2012 by Dr. Lorine Bears from  [facility]  who perform balloon angioplasty             on LT Anterior Tibial and Tibioperoneal trunk. The left SFA was diffusely             diseased and 2 self-expanding stents placed. Will consult vascular               Surgery. Left lower extremity arterial Doppler pending spoke with Dr. Arbie Cookey               (vascular surgery) who will determine course of treatment        9. DM; the increase patient's SSI to obese/resistance scale   Code Status: Full Family Communication:  Disposition Plan:    Consultants: Cardiology  Procedures:  Echocardiogram 11/22/2012 - Left ventricle: mildly dilated. LVEF=  45% to 50%., hypokinesis of the inferior posterior myocardium. Doppler parameters are consistent with high ventricular filling pressure. - Mitral valve: Calcified annulus. Mild regurgitation. - Left atrium: The atrium was mildly dilated. - Right ventricle: The cavity size was mildly dilated. - Right atrium:  The atrium was mildly dilated. - Pulmonary arteries: Systolic pressure was mildly increased. PA peak pressure: 46mm Hg (S). Impressions:   08/20/2012 Dr. Lorine Bears from  [facility]    He was seen in January of this year rest pain involving the left leg with toe ulceration and early gangrenous changes. He underwent angiography which showed severe diffuse mid left SFA disease as well as one-vessel runoff below the knee with 99% stenosis in the anterior tibial artery which was the only artery supplying the foot.  He underwent a staged endovascular intervention on the left lower extremity under general anesthesia due to inability to safely sedate him during his diagnostic angiography. The procedure was done via an antegrade access of the left femoral artery which was extremely difficult due to his obesity. I was able to perform balloon angioplasty on the left anterior tibial and tibioperoneal trunk. The left SFA was diffusely diseased and I placed 2 self-expanding stents.    Antibiotics:   HPI/Subjective: HPI: Philip Strong is a 72 y.o. male with known history of CAD status post CABG, peripheral vascular disease status post stenting, hypertension, hyperlipidemia, chronic anemia presented with complaints of shortness of breath and chest pain. Patient states he has been short of breath even at rest and off and on gets chest pressure. Denies any fever  chills or any productive cough. Denies nausea vomiting abdominal pain. In the ER troponins were mildly elevated EKG showed new-onset A. fib rate controlled with nonspecific T-wave changes. Due to patient's shortness of breath and persistent chest discomfort ER physician was concerned about a dissection and CT angiogram was done which was negative for dissection. Initially critical care and cardiology was consulted and at this time critical care signed off and requested hospitalist admission. In addition patient is found to be anemic more than his  usual and as per the ER physician stool for occult blood was negative and patient denies any blood in the stools or black stools. Patient has been given IV Lasix and also PRBC transfusion has been ordered. Patient will be admitted for further management. TODAY patient unsure of what his base weight should be, does not weigh himself daily, poor understanding of disease process (stated he thought his heart was fine now). Also states that this year (2014) had vascular surgery with a placed a stent in his left lower leg patient complaining of 2 weeks of increased foot pain as well as blackening of the toes   Objective: Filed Vitals:   11/23/12 2120 11/24/12 0533 11/24/12 0950 11/24/12 1454  BP: 111/64 131/51 120/60 91/71  Pulse: 50 52 54 65  Temp: 98.4 F (36.9 C) 97.4 F (36.3 C)  97.4 F (36.3 C)  TempSrc: Oral Oral  Oral  Resp: 20 20 20 20   Height:      Weight:  128.096 kg (282 lb 6.4 oz)    SpO2: 100% 100% 100% 100%    Intake/Output Summary (Last 24 hours) at 11/24/12 1835 Last data filed at 11/24/12 1700  Gross per 24 hour  Intake    780 ml  Output   1325 ml  Net   -545 ml   Filed Weights   11/22/12 0145 11/23/12 0616 11/24/12 0533  Weight: 130.6 kg (287 lb 14.7 oz) 129.9 kg (286 lb 6 oz) 128.096 kg (282 lb 6.4 oz)    Exam:   General: Alert,NAD  Cardiovascular: Regular rhythm and rate, negative murmurs rubs gallops, left PT/DP pulse nonpalpable  Respiratory: Clear to auscultation bilateral  Abdomen: Soft nontender nondistended plus bowel sounds  Musculoskeletal: Left foot appears to have dry gangrene in all 5 metatarsals, extending proximally to the ankle   Data Reviewed: Basic Metabolic Panel:  Recent Labs Lab 11/21/12 2102 11/22/12 0800 11/22/12 2023 11/23/12 0620 11/24/12 0600  NA 140 135 134* 138 133*  K 4.1 5.8* 4.4 3.8 3.9  CL 107 103 103 104 101  CO2  --  17* 18* 19 20  GLUCOSE 83 77 194* 48* 65*  BUN 25* 30* 35* 33* 39*  CREATININE 1.70* 1.47*  1.73* 1.58* 1.69*  CALCIUM  --  8.6 8.4 8.6 8.4  MG  --   --  2.1 2.1 2.2   Liver Function Tests:  Recent Labs Lab 11/22/12 0800 11/22/12 2023 11/23/12 0620 11/24/12 0600  AST 2296* 2085* 1182* 363*  ALT 1586* 1496* 1294* 907*  ALKPHOS 310* 366* 347* 318*  BILITOT 0.9 0.6 0.7 0.5  PROT 8.1 8.2 8.0 8.2  ALBUMIN 2.0* 2.0* 2.1* 2.0*   No results found for this basename: LIPASE, AMYLASE,  in the last 168 hours No results found for this basename: AMMONIA,  in the last 168 hours CBC:  Recent Labs Lab 11/21/12 2050 11/21/12 2102 11/22/12 0800 11/22/12 2023 11/23/12 0620 11/24/12 0600  WBC 13.4*  --  11.1* 8.0 8.5 9.5  NEUTROABS 10.3*  --  8.6* 6.2 6.6 6.7  HGB 7.1* 8.2* 7.7* 8.2* 8.5* 8.2*  HCT 21.3* 24.0* 22.4* 24.7* 25.6* 25.1*  MCV 63.8*  --  65.9* 67.9* 67.7* 68.8*  PLT 450*  --  369 253 231 182   Cardiac Enzymes:  Recent Labs Lab 11/22/12 0800 11/22/12 1426 11/22/12 1844  TROPONINI 0.55* 0.53* 0.41*   BNP (last 3 results)  Recent Labs  11/22/12 1426 11/23/12 1852  PROBNP 2633.0* 3413.0*   CBG:  Recent Labs Lab 11/23/12 2119 11/24/12 0620 11/24/12 0637 11/24/12 1105 11/24/12 1607  GLUCAP 275* 65* 72 165* 299*    Recent Results (from the past 240 hour(s))  MRSA PCR SCREENING     Status: None   Collection Time    11/22/12  1:47 AM      Result Value Range Status   MRSA by PCR NEGATIVE  NEGATIVE Final   Comment:            The GeneXpert MRSA Assay (FDA     approved for NASAL specimens     only), is one component of a     comprehensive MRSA colonization     surveillance program. It is not     intended to diagnose MRSA     infection nor to guide or     monitor treatment for     MRSA infections.     Studies: Dg Chest 2 View  11/23/2012   *RADIOLOGY REPORT*  Clinical Data: CHF.  CHEST - 2 VIEW  Comparison: 11/06/2012  Findings: Mild to moderate cardiomegaly.  Diffuse interstitial and alveolar opacities compatible with CHF.  Prior CABG.  No  effusions. No acute bony abnormality.  IMPRESSION: Mild CHF.   Original Report Authenticated By: Charlett Nose, M.D.    Scheduled Meds: . amLODipine  10 mg Oral Daily  . aspirin EC  325 mg Oral Daily  . atorvastatin  80 mg Oral Daily  . famotidine  20 mg Oral Daily  . ferrous sulfate  325 mg Oral Q breakfast  . furosemide  40 mg Intravenous Q12H  . heparin  5,000 Units Subcutaneous Q8H  . insulin aspart  0-9 Units Subcutaneous TID WC  . insulin aspart protamine- aspart  75 Units Subcutaneous BID WC  . metolazone  5 mg Oral Daily  . metoprolol succinate  100 mg Oral Q breakfast  . potassium chloride  10 mEq Oral Daily  . sodium chloride  3 mL Intravenous Q12H  . sodium chloride  3 mL Intravenous Q12H   Continuous Infusions:   Principal Problem:   CHF (congestive heart failure) Active Problems:   NSTEMI (non-ST elevated myocardial infarction)   Anemia   Atrial fibrillation   ARF (acute renal failure)   GI bleed   Nicotine dependence   Drug abuse, marijuana    Time spent: 40 minutes    Johnette Teigen, J  [facility] Pager [phone]. If 7PM-7AM, please contact night-coverage at [email or web address], password [facility] 11/24/2012, 6:35 PM  LOS: 3 days

## 2012-11-24 NOTE — Progress Notes (Addendum)
Pt having dyspnea w/excretion. Pt move from bed to chair had to rest before going to bathroom. Pt continue to have episode of brady 38-39 today nonsustained . Alarm set for 35 Hr.  Pt on/off 2L this shift, begins to feel SOB at rest w/o out O2.  May need to adjust insulin giving low CBG in the AM and high in the PM. Will continue to monitor

## 2012-11-24 NOTE — Progress Notes (Signed)
Subjective:  Still complaining of foot pain, mildly short of breath today.  Objective:  Vital Signs in the last 24 hours: BP 120/60  Pulse 54  Temp(Src) 97.4 F (36.3 C) (Oral)  Resp 20  Ht 6\' 3"  (1.905 m)  Wt 128.096 kg (282 lb 6.4 oz)  BMI 35.3 kg/m2  SpO2 100%  Physical Exam: Pleasant black male in no acute distress complaining of foot pain.  Lungs:  Clear Cardiac:  Regular rhythm, normal S1 and S2, no S3 Abdomen:  Soft, nontender, no masses Extremities: 2+ edema present, pedal pulses are diminished.  Intake/Output from previous day: 08/02 0701 - 08/03 0700 In: 940 [P.O.:940] Out: 1850 [Urine:1850] Weight Filed Weights   11/22/12 0145 11/23/12 0616 11/24/12 0533  Weight: 130.6 kg (287 lb 14.7 oz) 129.9 kg (286 lb 6 oz) 128.096 kg (282 lb 6.4 oz)    Lab Results: Basic Metabolic Panel:  Recent Labs  11/91/47 0620 11/24/12 0600  NA 138 133*  K 3.8 3.9  CL 104 101  CO2 19 20  GLUCOSE 48* 65*  BUN 33* 39*  CREATININE 1.58* 1.69*    CBC:  Recent Labs  11/23/12 0620 11/24/12 0600  WBC 8.5 9.5  NEUTROABS 6.6 6.7  HGB 8.5* 8.2*  HCT 25.6* 25.1*  MCV 67.7* 68.8*  PLT 231 182    BNP    Component Value Date/Time   PROBNP 3413.0* 11/23/2012 1852    Telemetry: Sinus rhythm  Assessment/Plan:  1. Chronic heart failure-predominantly diastolic 2. Coronary artery disease with previous bypass grafting 3. Peripheral vascular disease with rest pain in left leg 4. Severe anemia  Recommendations:  He is diuresing well. His BNP is only mildly elevated. Vascular surgery evaluation noted.  Philip Palmer  MD [facility]  11/24/2012, 11:30 AM

## 2012-11-24 NOTE — Consult Note (Signed)
[facility] [place]   Patient name: Philip Strong MRN: [id number] DOB: 30-Jan-1941 Sex: male   Referred by: Joseph Art  Reason for referral:  Chief Complaint  Patient presents with  . Shortness of Breath    HISTORY OF PRESENT ILLNESS: I was asked to see this very complex 72 year old gentleman for evaluation of his left foot. 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 He is known lower surety arterial insufficiency on the left and is undergone extensive treatment with Dr. Kirke Corin. He had left superficial femoral artery angioplasty and stenting and angioplasty of his tibioperoneal trunk and single-vessel runoff in his anterior tibial in February of this year. He does not walk with a prosthesis. He reports that this does not fit on his right leg. He mainly transfers from wheelchair to bed with a few steps with his walker. He has severe coronary disease as well with coronary bypass grafting in 1999. He is admitted now with congestive failure and pulmonary edema. He reports that for the past 3 weeks he has been having severe pain in his left foot and toes. He was to see Dr.Arida or followup on 11/26/2012.  He reports that the pain in his foot is keeping him from sleeping. He also has severe tenderness over this.  Past Medical History  Diagnosis Date  . Diabetes mellitus, insulin dependent (IDDM), uncontrolled   . Hypertension   . Coronary artery disease     a. Remote MI. b. CABG 1999.  Marland Kitchen Myocardial infarct   . 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 PAD (peripheral artery disease) 05/01/2012    a. 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 b. s/p extensive LLE angioplasty and stent placement x 2 to the SFA and angioplasty of anterior tibial and tibioperoneal trunck in 05/2012.  . Ischemic toe   . Hyperlipidemia   . Prostate ca     s/p radioactive seed  implant  . CHF (congestive heart failure)     Past Surgical History  Procedure Laterality Date  . Leg amputation below knee      right  . Coronary artery bypass graft    . Right patella removal    . Right finger fracture    . Closed reduction clavicle fracture    . Angioplasty illiac artery  06/05/2012    History   Social History  . Marital Status: Divorced    Spouse Name: N/A    Number of Children: N/A  . Years of Education: N/A   Occupational History  . Not on file.   Social History Main Topics  . Smoking status: Current Every Day Smoker -- 0.50 packs/day for 15 years  . Smokeless tobacco: Not on file  . Alcohol Use: Yes     Comment: occ  . Drug Use: Yes    Special: Marijuana  . Sexually Active: Not on file   Other Topics Concern  . Not on file   Social History Narrative  . No narrative on file    Family History  Problem Relation Age of Onset  . Heart attack Mother     Allergies as of 11/21/2012  . (No Known Allergies)    No current facility-administered medications on file prior to encounter.   Current Outpatient Prescriptions on File Prior to Encounter  Medication Sig Dispense Refill  . amLODipine (NORVASC) 10 MG tablet Take  10 mg by mouth daily.      Marland Kitchen aspirin 325 MG tablet Take 325 mg by mouth daily.      Marland Kitchen atorvastatin (LIPITOR) 80 MG tablet Take 1 tablet (80 mg total) by mouth daily.  30 tablet  6  . clopidogrel (PLAVIX) 75 MG tablet Take 1 tablet (75 mg total) by mouth daily.  30 tablet  6  . ferrous sulfate 325 (65 FE) MG tablet Take 325 mg by mouth daily with breakfast.      . furosemide (LASIX) 20 MG tablet Take 20 mg by mouth every morning.       . insulin NPH-insulin regular (NOVOLIN 70/30) (70-30) 100 UNIT/ML injection Inject 75 Units into the skin 2 (two) times daily with a meal.       . lisinopril (PRINIVIL,ZESTRIL) 40 MG tablet Take 40 mg by mouth daily.      . metoprolol succinate (TOPROL-XL) 100 MG 24 hr tablet Take 100 mg by mouth  daily. Take with or immediately following a meal.      . potassium chloride (K-DUR) 10 MEQ tablet Take 10 mEq by mouth daily.      . ranitidine (ZANTAC) 150 MG tablet Take 150 mg by mouth daily.         REVIEW OF SYSTEMS:  Positives indicated with an "X"  CARDIOVASCULAR:  [ ]  chest pain   [ ]  chest pressure   [ ]  palpitations   [ ]  orthopnea   [ ]  dyspnea on exertion   [ ]  claudication   [ ]  rest pain   [ ]  DVT   [ ]  phlebitis PULMONARY:   [ ]  productive cough   [ ]  asthma   [ ]  wheezing NEUROLOGIC:   [ ]  weakness  [ ]  paresthesias  [ ]  aphasia  [ ]  amaurosis  [ ]  dizziness HEMATOLOGIC:   [ ]  bleeding problems   [ ]  clotting disorders MUSCULOSKELETAL:  [ ]  joint pain   [ ]  joint swelling GASTROINTESTINAL: [ ]   blood in stool  [ ]   hematemesis GENITOURINARY:  [ ]   dysuria  [ ]   hematuria PSYCHIATRIC:  [ ]  history of major depression INTEGUMENTARY:  [ ]  rashes  [ ]  ulcers CONSTITUTIONAL:  [ ]  fever   [ ]  chills  PHYSICAL EXAMINATION:  General: The patient is a well-nourished male, in no acute distress. Does report severe left foot pain Vital signs are BP 120/60  Pulse 54  Temp(Src) 97.4 F (36.3 C) (Oral)  Resp 20  Ht 6\' 3"  (1.905 m)  Wt 282 lb 6.4 oz (128.096 kg)  BMI 35.3 kg/m2  SpO2 100% Pulmonary: There is a good air exchange bilaterally   Musculoskeletal: Well-healed right below-knee amputation Neurologic: No focal weakness or paresthesias are detected, Skin: There are no ulcer or rashes noted. Psychiatric: The patient has normal affect. Cardiovascular: Palpable left femoral pulse with absent popliteal and distal pulses. Left foot with no erythema. There is some severe tenderness over all of his toes. He does have some duskiness under the plantar aspect of his toes. There does not appear to be any invasive infection Marked edema.   Impression and Plan:  Vascular lab studies are pending. Probable recurrent severe ischemia in his left foot. I discussed this with the  patient and his wife present. I explained that he will require repeat arteriography to determine the level of his flow in the endovascular versus surgical treatment options are present. I did explain the significant risk  for progression to limb loss. Will notify Dr.Arida tomorrow for plans for repeat arteriography. This will need to be coordinated around his cardiac issues. No need for urgent intervention currently    Nil Bolser [facility] [place] Office: [phone]

## 2012-11-24 NOTE — Progress Notes (Signed)
Pt's BG=65, pt asymptomatic, gave 2 cups orange juice and rechecked BG=72. Will continue to monitor.

## 2012-11-24 NOTE — Progress Notes (Signed)
CRITICAL VALUE ALERT  Critical value received: bg=65  Date of notification:  11/24/12  Time of notification:  0620  Critical value read back:yes  Nurse who received alert:  G. Zerenity Bowron  MD notified (1st page):  M. lynch  Time of first page:  0625  MD notified (2nd page):  Time of second page:  Responding MD:    Time MD responded:

## 2012-11-24 NOTE — Progress Notes (Signed)
Pt complaing of Lt foot pain this Am, tylenol given. Will continue to monitor

## 2012-11-25 DIAGNOSIS — I739 Peripheral vascular disease, unspecified: Secondary | ICD-10-CM

## 2012-11-25 DIAGNOSIS — I999 Unspecified disorder of circulatory system: Secondary | ICD-10-CM

## 2012-11-25 LAB — COMPREHENSIVE METABOLIC PANEL
ALT: 653 U/L — ABNORMAL HIGH (ref 0–53)
AST: 165 U/L — ABNORMAL HIGH (ref 0–37)
CO2: 20 mEq/L (ref 19–32)
Calcium: 8.4 mg/dL (ref 8.4–10.5)
Potassium: 4 mEq/L (ref 3.5–5.1)
Sodium: 135 mEq/L (ref 135–145)
Total Protein: 8.1 g/dL (ref 6.0–8.3)

## 2012-11-25 LAB — GLUCOSE, CAPILLARY
Glucose-Capillary: 64 mg/dL — ABNORMAL LOW (ref 70–99)
Glucose-Capillary: 109 mg/dL — ABNORMAL HIGH (ref 70–99)

## 2012-11-25 LAB — CBC WITH DIFFERENTIAL/PLATELET
Basophils Relative: 0 % (ref 0–1)
Eosinophils Absolute: 0.2 10*3/uL (ref 0.0–0.7)
HCT: 25.6 % — ABNORMAL LOW (ref 39.0–52.0)
Hemoglobin: 8.2 g/dL — ABNORMAL LOW (ref 13.0–17.0)
Lymphs Abs: 1.8 10*3/uL (ref 0.7–4.0)
MCH: 22.2 pg — ABNORMAL LOW (ref 26.0–34.0)
MCHC: 32 g/dL (ref 30.0–36.0)
Monocytes Absolute: 0.9 10*3/uL (ref 0.1–1.0)
Neutro Abs: 6.8 10*3/uL (ref 1.7–7.7)

## 2012-11-25 MED ORDER — HYDROCODONE-ACETAMINOPHEN 5-325 MG PO TABS
1.0000 | ORAL_TABLET | Freq: Four times a day (QID) | ORAL | Status: DC | PRN
  Administered 2012-11-25 – 2012-11-29 (×7): 1 via ORAL
  Filled 2012-11-25 (×7): qty 1

## 2012-11-25 MED ORDER — LORAZEPAM 0.5 MG PO TABS
0.5000 mg | ORAL_TABLET | Freq: Once | ORAL | Status: AC
  Administered 2012-11-25: 0.5 mg via ORAL
  Filled 2012-11-25: qty 1

## 2012-11-25 NOTE — Progress Notes (Signed)
Put pt on RA  O2 stat was 100%, but Pt stated he felt better with O2 on at 2L

## 2012-11-25 NOTE — Progress Notes (Addendum)
Pt setting on edge of bed. States pain in Lt foot. May need to change PRN to q6 from q8 to better control pain. Pt states feel can not sleep flat on back at this time

## 2012-11-25 NOTE — Progress Notes (Signed)
Has had 3piv in 2 days/ very poor veins/ piv in thumb placed to get him through the night but request MD team consider a central or Picc line.  Thank you   Mardene Speak, RN IV Team

## 2012-11-25 NOTE — Progress Notes (Signed)
CRITICAL VALUE ALERT  Critical value received: BG=64  Date of notification:  11/25/13  Time of notification:  0330  Critical value read back:yes  Nurse who received alert:  G. Gildardo Tickner  MD notified (1st page):  R. Reidler  Time of first page:  [phone]  MD notified (2nd page):  Time of second page:  Responding MD:    Time MD responded:

## 2012-11-25 NOTE — Progress Notes (Signed)
SUBJECTIVE: No chest pain. Breathing ok. Only c/o left foot pain.   BP 117/59  Pulse 50  Temp(Src) 98.1 F (36.7 C) (Oral)  Resp 18  Wt 289 lb 11 oz (131.4 kg)  BMI 36.21 kg/m2  SpO2 100%  Intake/Output Summary (Last 24 hours) at 11/25/12 0932 Last data filed at 11/25/12 4782  Gross per 24 hour  Intake   1180 ml  Output   1150 ml  Net     30 ml    PHYSICAL EXAM General: Well developed, well nourished, in no acute distress. Alert and oriented x 3.  Psych:  Good affect, responds appropriately Neck: No JVD. No masses noted.  Lungs: Clear bilaterally with no wheezes or rhonci noted.  Heart: Irregul, brady with no murmurs noted. Abdomen: Bowel sounds are present. Soft, non-tender.  Extremities: No lower extremity edema.   LABS: Basic Metabolic Panel:  Recent Labs  95/62/13 0600 11/25/12 0525  NA 133* 135  K 3.9 4.0  CL 101 100  CO2 20 20  GLUCOSE 65* 125*  BUN 39* 48*  CREATININE 1.69* 1.72*  CALCIUM 8.4 8.4  MG 2.2 2.3   CBC:  Recent Labs  11/24/12 0600 11/25/12 0525  WBC 9.5 9.7  NEUTROABS 6.7 6.8  HGB 8.2* 8.2*  HCT 25.1* 25.6*  MCV 68.8* 69.4*  PLT 182 194   Cardiac Enzymes:  Recent Labs  11/22/12 1426 11/22/12 1844  TROPONINI 0.53* 0.41*   Current Meds: . amLODipine  10 mg Oral Daily  . aspirin EC  325 mg Oral Daily  . atorvastatin  80 mg Oral Daily  . famotidine  20 mg Oral Daily  . ferrous sulfate  325 mg Oral Q breakfast  . heparin  5,000 Units Subcutaneous Q8H  . insulin aspart  0-20 Units Subcutaneous Q4H  . insulin aspart protamine- aspart  75 Units Subcutaneous BID WC  . metoprolol succinate  100 mg Oral Q breakfast  . potassium chloride  10 mEq Oral Daily  . sodium chloride  3 mL Intravenous Q12H  . sodium chloride  3 mL Intravenous Q12H   Echo 11/22/12: Left ventricle: The cavity size was mildly dilated. Wall thickness was normal. Systolic function was mildly reduced. The estimated ejection fraction was in the range of  45% to 50%. There is hypokinesis of the inferior posterior myocardium. Doppler parameters are consistent with high ventricular filling pressure. - Mitral valve: Calcified annulus. Mild regurgitation. - Left atrium: The atrium was mildly dilated. - Right ventricle: The cavity size was mildly dilated. - Right atrium: The atrium was mildly dilated. - Pulmonary arteries: Systolic pressure was mildly increased. PA peak pressure: 46mm Hg (S). Impressions:  - Technically difficult; there appears to be hypokinesis of the inferoposterior myocardium with mildly reduced LV function; suggest cardiac MR or MUGA if clinically indicated.  ASSESSMENT AND PLAN:  1. CAD: pt is s/p CABG. Troponin mildly elevated on admission and trending down at last check. Felt to be Type II NSTEMI in setting of anemia. If he ends up needing to have vascular surgery or amputation, will need ischemic testing before the surgery. I would likely recommend right and left heart cath to define CAD and assess filling pressures. Will follow and make more recommendations this week.   2. Acute on chronic combined systolic and diastolic CHF: His I/O are even since admission. Weight is up 2 lbs since admission. Creatinine is trending up over last 48 hours. I think he needs continued diuresis today. He appears  to still be volume overloaded. Would attempt diuresis with IV Lasix today and follow creatinine closely.   3. Atrial fibrillation: Appears to be new at time of this admission. Still in atrial fib with controlled rate. He has not been anti-coagulated. Hold for now with pending procedures.   4. PAD: He has been followed as an outpatient by Dr. Kirke Corin. He had left superficial femoral artery angioplasty and stenting and angioplasty of his tibioperoneal trunk and single-vessel runoff in his anterior tibial in February 2014. [facility] following. Plans for LE angiography per [facility] to define disease.   5. Anemia: ? Etiology. H/H stable but low.    Philip Strong  8/4/20149:32 AM

## 2012-11-25 NOTE — Progress Notes (Signed)
Pt appears more SOB today  And weight is up. Lasix is being hold d/t kidney function. Held 70/30 insulin to Per MD, changing Pt to lanuts. Pt continues to have Lt foot pain. PRN Norco changed to q6

## 2012-11-25 NOTE — Progress Notes (Signed)
Patient ID: Philip Strong, male   DOB: 08/13/1940, 72 y.o.   MRN: [id number] Remains hemodynamically stable. Continue with some shortness of breath. No change in physical exam of this foot. Discontinued at rest pain with some tenderness over his toes but no evidence of necrosis.  I discussed this with Dr.Arida did undergo an extensive SFA and tibial angioplasty in February 2014. Dr. Kirke Corin will evaluate for repeat arteriogram hopeful endovascular treatment

## 2012-11-25 NOTE — Progress Notes (Signed)
Dr. Arbie Cookey discussed the case with me. The patient is well known to me as outlined in notes.  He is now presenting with CHF, anemia, new A-fib, ARF and recurrent rest pain involving left foot. I will see him tomorrow. He will require repeat angiogram but I am hoping that his other medical issues will be optimized before then.  Lorine Bears, MD, [facility] Pager [phone]

## 2012-11-25 NOTE — Progress Notes (Signed)
Left foot warm and tender to touch , pulses appreciated by doppler.continued to observe left foot .

## 2012-11-25 NOTE — Clinical Documentation Improvement (Signed)
THIS DOCUMENT IS NOT A PERMANENT PART OF THE MEDICAL RECORD  Please update your documentation with the medical record to reflect your response to this query. If you need help knowing how to do this please call [phone].  11/25/12  Dr. Joseph Art,  In a better effort to capture your patient's severity of illness, reflect appropriate length of stay and utilization of resources, a review of the patient medical record has revealed the following information:   - Patient admitted with Heart Failure   11/22/12  ECHO  Study Conclusions - Left ventricle: The cavity size was mildly dilated. Wall thickness was normal. Systolic function was mildly reduced. The estimated ejection fraction was in the range of 45% to 50%. There is hypokinesis of the inferior posterior myocardium. Doppler parameters are consistent with high ventricular filling pressure. - Mitral valve: Calcified annulus. Mild regurgitation. - Left atrium: The atrium was mildly dilated. - Right ventricle: The cavity size was mildly dilated. - Right atrium: The atrium was mildly dilated. - Pulmonary arteries: Systolic pressure was mildly increased. PA peak pressure: 46mm Hg (S). Impressions: - Technically difficult; there appears to be hypokinesis of the inferoposterior myocardium with mildly reduced LV function; suggest cardiac MR or MUGA if clinically indicated.   "Chronic heart failure-predominantly diastolic" Progress Notes signed by Othella Boyer, MD at 11/24/2012 11:32 AM    Based on your clinical judgment, please document the ACUITY and TYPE of Heart Failure monitored and treated this admission in the progress notes and discharge summary:  ACUITY:  - Acute  - Chronic  - Acute on Chronic  AND    TYPE:  - Systolic  - Diastolic  - Combined   In responding to this query please exercise your independent judgment.    The fact that a query is asked, does not imply that any particular answer is desired or  expected.   Reviewed: 11/25/12 - query withdrawn - acuity and type documented by dr. Sanjuana Kava 11/25/12.  Mathis Dad RN.  Thank You,  Jerral Ralph  RN BSN CCDS Certified Clinical Documentation Specialist [phone] Health Information Management [place]

## 2012-11-25 NOTE — Progress Notes (Signed)
Inpatient Diabetes Program Recommendations  AACE/ADA: New Consensus Statement on Inpatient Glycemic Control (2013)  Target Ranges:  Prepandial:   less than 140 mg/dL      Peak postprandial:   less than 180 mg/dL (1-2 hours)      Critically ill patients:  140 - 180 mg/dL  Results for JOHNJOSEPH, ROLFE (MRN [id number]) as of 11/25/2012 14:57  Ref. Range 11/25/2012 03:29 11/25/2012 04:02 11/25/2012 07:27 11/25/2012 11:44  Glucose-Capillary Latest Range: 70-99 mg/dL 64 (L) 97 409 (H) 811 (H)    Inpatient Diabetes Program Recommendations Insulin - Basal: Decrease 70/30 to 35 units with breakfast and supper Patient has been experiencing hypoglycemia in the morning and therefore not receiving the morning dose of 70/30. Thank you  Piedad Climes BSN, RN,CDE Inpatient Diabetes Coordinator [phone] (team pager)

## 2012-11-25 NOTE — Progress Notes (Signed)
Pt's BG=64, pt asymptomatic gave 2 cups of orange juice, rechecked BG=97. Will monitor.

## 2012-11-26 ENCOUNTER — Ambulatory Visit: Payer: Medicare Other | Admitting: Cardiovascular Disease

## 2012-11-26 DIAGNOSIS — Z48812 Encounter for surgical aftercare following surgery on the circulatory system: Secondary | ICD-10-CM

## 2012-11-26 LAB — URINE DRUGS OF ABUSE SCREEN W ALC, ROUTINE (REF LAB)
Barbiturate Quant, Ur: NEGATIVE
Benzodiazepines.: NEGATIVE
Ethyl Alcohol: <10 mg/dL (ref <10)
Opiate Screen, Urine: NEGATIVE
Phencyclidine (PCP): NEGATIVE
Propoxyphene: NEGATIVE

## 2012-11-26 LAB — CBC WITH DIFFERENTIAL/PLATELET
Basophils Absolute: 0 10*3/uL (ref 0.0–0.1)
Eosinophils Absolute: 0.1 10*3/uL (ref 0.0–0.7)
Eosinophils Relative: 1 % (ref 0–5)
MCH: 22.2 pg — ABNORMAL LOW (ref 26.0–34.0)
Monocytes Absolute: 0.9 10*3/uL (ref 0.1–1.0)
Neutrophils Relative %: 70 % (ref 43–77)
Platelets: 212 10*3/uL (ref 150–400)
RBC: 3.6 MIL/uL — ABNORMAL LOW (ref 4.22–5.81)
RDW: 22.3 % — ABNORMAL HIGH (ref 11.5–15.5)

## 2012-11-26 LAB — UIFE/LIGHT CHAINS/TP QN, 24-HR UR
Albumin, U: DETECTED
Beta, Urine: DETECTED — AB
Free Lambda Lt Chains,Ur: 11 mg/dL — ABNORMAL HIGH (ref 0.02–0.67)
Gamma Globulin, Urine: DETECTED — AB

## 2012-11-26 LAB — GLUCOSE, CAPILLARY
Glucose-Capillary: 184 mg/dL — ABNORMAL HIGH (ref 70–99)
Glucose-Capillary: 137 mg/dL — ABNORMAL HIGH (ref 70–99)
Glucose-Capillary: 225 mg/dL — ABNORMAL HIGH (ref 70–99)
Glucose-Capillary: 353 mg/dL — ABNORMAL HIGH (ref 70–99)

## 2012-11-26 LAB — COMPREHENSIVE METABOLIC PANEL
AST: 76 U/L — ABNORMAL HIGH (ref 0–37)
BUN: 41 mg/dL — ABNORMAL HIGH (ref 6–23)
CO2: 24 mEq/L (ref 19–32)
Chloride: 102 mEq/L (ref 96–112)
Creatinine, Ser: 1.36 mg/dL — ABNORMAL HIGH (ref 0.50–1.35)
GFR calc non Af Amer: 50 mL/min — ABNORMAL LOW (ref >90)
Total Bilirubin: 0.5 mg/dL (ref 0.3–1.2)

## 2012-11-26 LAB — PROTEIN ELECTROPHORESIS, SERUM
Alpha-2-Globulin: 17.7 % — ABNORMAL HIGH (ref 7.1–11.8)
Gamma Globulin: 29.1 % — ABNORMAL HIGH (ref 11.1–18.8)
M-Spike, %: NOT DETECTED g/dL

## 2012-11-26 LAB — MAGNESIUM: Magnesium: 2.4 mg/dL (ref 1.5–2.5)

## 2012-11-26 MED ORDER — SODIUM CHLORIDE 0.9 % IV SOLN
INTRAVENOUS | Status: DC
  Administered 2012-11-27: 07:00:00 via INTRAVENOUS

## 2012-11-26 MED ORDER — OXYCODONE HCL ER 10 MG PO T12A
10.0000 mg | EXTENDED_RELEASE_TABLET | Freq: Two times a day (BID) | ORAL | Status: DC
  Administered 2012-11-26 – 2012-11-30 (×8): 10 mg via ORAL
  Filled 2012-11-26 (×8): qty 1

## 2012-11-26 MED ORDER — INSULIN GLARGINE 100 UNIT/ML ~~LOC~~ SOLN
50.0000 [IU] | Freq: Every day | SUBCUTANEOUS | Status: DC
  Administered 2012-11-27 – 2012-11-30 (×4): 50 [IU] via SUBCUTANEOUS
  Filled 2012-11-26 (×5): qty 0.5

## 2012-11-26 MED ORDER — ASPIRIN 81 MG PO CHEW
324.0000 mg | CHEWABLE_TABLET | ORAL | Status: AC
  Administered 2012-11-27: 324 mg via ORAL
  Filled 2012-11-26: qty 4

## 2012-11-26 MED ORDER — INSULIN GLARGINE 100 UNIT/ML ~~LOC~~ SOLN
40.0000 [IU] | Freq: Every day | SUBCUTANEOUS | Status: DC
Start: 2012-11-26 — End: 2012-11-26
  Administered 2012-11-26: 40 [IU] via SUBCUTANEOUS
  Filled 2012-11-26: qty 0.4

## 2012-11-26 NOTE — Progress Notes (Signed)
Pt with episodes of on and off dyspnea. Left foot + pulses by doppler, tender to touch. Complaining of intermittent left foot pain, vicodin given x 2 for left foot pain.

## 2012-11-26 NOTE — Progress Notes (Signed)
Consent obtained for Left Lower Extremity Angiography and Possible Percutaneous Transluminal Angioplasty for Peripheral Arterial Disease with Rest Pain.  No voiced complaints at present.

## 2012-11-26 NOTE — Progress Notes (Signed)
[facility] PROGRESS NOTE  [name]:[id number] DOB: [birth year] DOA: 11/21/2012 PCP: [name], MD  Assessment/Plan: 1. 1. CHF; patient has poor understanding of disease process. Does not know what recommended base weight is. We'll aggressively diuresis. Admission weight= 287lbs, base weight = 282lbs (will use this as his base weight; creatinine beginning to creep up) current weight= 286lbs                                                              2.  Anemia; transfused 2 x unit PRBC upon admission H./H. continues to be Stable  3. NSTEMI; has been ruled out  4. ARF; he DC'd Lasix and Zaroxolyn continue to monitor patient's weight, creatinine. Creatinine now decreasing current creatinine=1.36  5. GI bleed; H./H.   6. Nicotine Dependence; patient admits to smoking a pack a day x5 years stopped 3 weeks ago. Counseled extensively on not ever smoking again secondary to his multiple medical problems   7. Drug abuse; urinary tox screen (negative)        8. Left foot dry gangrene; patient now complaining of increased pain with past            week. Same issues have been addressed  08/20/2012 by Dr. Lorine Bears from  [facility]  who perform balloon angioplasty             on LT Anterior Tibial and Tibioperoneal trunk. The left SFA was diffusely             diseased and 2 self-expanding stents placed. Will consult vascular               Surgery. Left lower extremity arterial Doppler pending spoke with Dr. Arbie Cookey               (vascular surgery) who will determine course of treatment. Patient              scheduled start his workup in the a.m. to determine course of treatment               for left leg/foot         9. DM; will increase Lantus to 50 units each bedtime and continue patient's           SSI to obese/resistance scale              10. Foot pain; add oxycodone extended release 10 mg twice a day   Code Status: Full Family Communication:   Disposition Plan:    Consultants: Cardiology  Procedures:  Echocardiogram 11/22/2012 - Left ventricle: mildly dilated. LVEF=  45% to 50%., hypokinesis of the inferior posterior myocardium. Doppler parameters are consistent with high ventricular filling pressure. - Mitral valve: Calcified annulus. Mild regurgitation. - Left atrium: The atrium was mildly dilated. - Right ventricle: The cavity size was mildly dilated. - Right atrium: The atrium was mildly dilated. - Pulmonary arteries: Systolic pressure was mildly increased. PA peak pressure: 46mm Hg (S). Impressions:   08/20/2012 Dr. Lorine Bears from  [facility]    He was seen in January of this year rest pain involving the left leg with toe ulceration and early gangrenous changes. He underwent angiography which  showed severe diffuse mid left SFA disease as well as one-vessel runoff below the knee with 99% stenosis in the anterior tibial artery which was the only artery supplying the foot.  He underwent a staged endovascular intervention on the left lower extremity under general anesthesia due to inability to safely sedate him during his diagnostic angiography. The procedure was done via an antegrade access of the left femoral artery which was extremely difficult due to his obesity. I was able to perform balloon angioplasty on the left anterior tibial and tibioperoneal trunk. The left SFA was diffusely diseased and I placed 2 self-expanding stents.    Antibiotics:   HPI/Subjective: HPI: Philip Strong is a 72 y.o. male with known history of CAD status post CABG, peripheral vascular disease status post stenting, hypertension, hyperlipidemia, chronic anemia presented with complaints of shortness of breath and chest pain. Patient states he has been short of breath even at rest and off and on gets chest pressure. Denies any fever chills or any productive cough. Denies nausea vomiting abdominal pain. In the ER troponins were mildly  elevated EKG showed new-onset A. fib rate controlled with nonspecific T-wave changes. Due to patient's shortness of breath and persistent chest discomfort ER physician was concerned about a dissection and CT angiogram was done which was negative for dissection. Initially critical care and cardiology was consulted and at this time critical care signed off and requested hospitalist admission. In addition patient is found to be anemic more than his usual and as per the ER physician stool for occult blood was negative and patient denies any blood in the stools or black stools. Patient has been given IV Lasix and also PRBC transfusion has been ordered. Patient will be admitted for further management. TODAY states feeling well though is nervous about the a.m. tests/surgery for his left leg. States pain has been controlled   Objective: Filed Vitals:   11/25/12 2054 11/26/12 0601 11/26/12 1008 11/26/12 1425  BP: 120/58 118/54 146/62 129/67  Pulse: 64 62 64 73  Temp: 97.9 F (36.6 C) 97.8 F (36.6 C)  97.8 F (36.6 C)  TempSrc: Oral Oral  Oral  Resp: 20 20  20   Height:      Weight:  130.137 kg (286 lb 14.4 oz)    SpO2: 98% 96%  98%    Intake/Output Summary (Last 24 hours) at 11/26/12 2011 Last data filed at 11/26/12 1900  Gross per 24 hour  Intake   1200 ml  Output   1550 ml  Net   -350 ml   Filed Weights   11/24/12 0533 11/25/12 0545 11/26/12 0601  Weight: 128.096 kg (282 lb 6.4 oz) 131.4 kg (289 lb 11 oz) 130.137 kg (286 lb 14.4 oz)    Exam:   General: Alert,NAD  Cardiovascular: Regular rhythm and rate, negative murmurs rubs gallops, left PT/DP pulse nonpalpable  Respiratory: Clear to auscultation bilateral  Abdomen: Soft nontender nondistended plus bowel sounds  Musculoskeletal: Left foot appears to have dry gangrene in all 5 metatarsals, extending proximally to the ankle   Data Reviewed: Basic Metabolic Panel:  Recent Labs Lab 11/22/12 2023 11/23/12 0620 11/24/12 0600  11/25/12 0525 11/26/12 0545  NA 134* 138 133* 135 136  K 4.4 3.8 3.9 4.0 4.0  CL 103 104 101 100 102  CO2 18* 19 20 20 24   GLUCOSE 194* 48* 65* 125* 137*  BUN 35* 33* 39* 48* 41*  CREATININE 1.73* 1.58* 1.69* 1.72* 1.36*  CALCIUM 8.4 8.6 8.4 8.4 8.7  MG 2.1 2.1 2.2 2.3 2.4   Liver Function Tests:  Recent Labs Lab 11/22/12 2023 11/23/12 0620 11/24/12 0600 11/25/12 0525 11/26/12 0545  AST 2085* 1182* 363* 165* 76*  ALT 1496* 1294* 907* 653* 478*  ALKPHOS 366* 347* 318* 291* 261*  BILITOT 0.6 0.7 0.5 0.4 0.5  PROT 8.2 8.0 8.2 8.1 8.2  ALBUMIN 2.0* 2.1* 2.0* 2.0* 2.1*   No results found for this basename: LIPASE, AMYLASE,  in the last 168 hours No results found for this basename: AMMONIA,  in the last 168 hours CBC:  Recent Labs Lab 11/22/12 2023 11/23/12 0620 11/24/12 0600 11/25/12 0525 11/26/12 0545  WBC 8.0 8.5 9.5 9.7 8.1  NEUTROABS 6.2 6.6 6.7 6.8 5.6  HGB 8.2* 8.5* 8.2* 8.2* 8.0*  HCT 24.7* 25.6* 25.1* 25.6* 24.9*  MCV 67.9* 67.7* 68.8* 69.4* 69.2*  PLT 253 231 182 194 212   Cardiac Enzymes:  Recent Labs Lab 11/22/12 0800 11/22/12 1426 11/22/12 1844  TROPONINI 0.55* 0.53* 0.41*   BNP (last 3 results)  Recent Labs  11/22/12 1426 11/23/12 1852  PROBNP 2633.0* 3413.0*   CBG:  Recent Labs Lab 11/26/12 0027 11/26/12 0418 11/26/12 0737 11/26/12 1143 11/26/12 1633  GLUCAP 317* 184* 137* 225* 352*    Recent Results (from the past 240 hour(s))  MRSA PCR SCREENING     Status: None   Collection Time    11/22/12  1:47 AM      Result Value Range Status   MRSA by PCR NEGATIVE  NEGATIVE Final   Comment:            The GeneXpert MRSA Assay (FDA     approved for NASAL specimens     only), is one component of a     comprehensive MRSA colonization     surveillance program. It is not     intended to diagnose MRSA     infection nor to guide or     monitor treatment for     MRSA infections.     Studies: No results found.  Scheduled Meds: .  amLODipine  10 mg Oral Daily  . aspirin EC  325 mg Oral Daily  . famotidine  20 mg Oral Daily  . ferrous sulfate  325 mg Oral Q breakfast  . heparin  5,000 Units Subcutaneous Q8H  . insulin aspart  0-20 Units Subcutaneous Q4H  . insulin glargine  40 Units Subcutaneous Daily  . metoprolol succinate  100 mg Oral Q breakfast  . potassium chloride  10 mEq Oral Daily  . sodium chloride  3 mL Intravenous Q12H  . sodium chloride  3 mL Intravenous Q12H   Continuous Infusions:   Principal Problem:   CHF (congestive heart failure) Active Problems:   NSTEMI (non-ST elevated myocardial infarction)   Anemia   Atrial fibrillation   ARF (acute renal failure)   GI bleed   Nicotine dependence   Drug abuse, marijuana    Time spent: 40 minutes    Azalee Weimer, J  [facility] Pager [phone]. If 7PM-7AM, please contact night-coverage at [email or web address], password [facility] 11/26/2012, 8:11 PM  LOS: 5 days

## 2012-11-26 NOTE — Progress Notes (Signed)
VASCULAR LAB PRELIMINARY  ARTERIAL  ABI completed:    RIGHT    LEFT    PRESSURE WAVEFORM  PRESSURE WAVEFORM  BRACHIAL 155 Triphasic  BRACHIAL 143 Triphasic   DP   DP 95 Dampened monophasic   AT BKA  AT    PT   PT  absent  PER   PER  absent  GREAT TOE  NA GREAT TOE  NA    RIGHT LEFT  ABI  0.61     Ruthanne Mcneish, RVT 11/26/2012, 11:10 AM

## 2012-11-26 NOTE — Progress Notes (Signed)
[facility] PROGRESS NOTE  Philip Strong ZOX:[id number] DOB: 1940-09-04 DOA: 11/21/2012 PCP: [name], MD  Assessment/Plan: 1. 1. CHF; patient has poor understanding of disease process. Does not know what recommended base weight is. We'll aggressively diuresis. Admission weight= 287lbs, base weight = 282lbs (will use this as his base weight; creatinine beginning to creep up) current weight= 289lbs                                                              2.  Anemia; transfused 2 x unit PRBC upon admission H./H. Stable  3. NSTEMI; has been ruled out  4. ARF; he DC'd Lasix and Zaroxolyn continue to monitor patient's weight, creatinine. Creatinine has peaked up slightly current creatinine=1.72  5. GI bleed; H./H.   6. Nicotine Dependence; patient admits to smoking a pack a day x5 years stopped 3 weeks ago. Counseled extensively on not ever smoking again secondary to his multiple medical problems   7. Drug abuse; urinary tox screen (negative)        8. Left foot dry gangrene; patient now complaining of increased pain with past            week. Same issues have been addressed  08/20/2012 by Dr. Lorine Bears from  [facility]  who perform balloon angioplasty             on LT Anterior Tibial and Tibioperoneal trunk. The left SFA was diffusely             diseased and 2 self-expanding stents placed. Will consult vascular               Surgery. Left lower extremity arterial Doppler pending spoke with Dr. Arbie Cookey               (vascular surgery) who will determine course of treatment. Patient scheduled start his workup in the a.m. to determine course of treatment for left leg/foot         9. DM; patient has been having multiple episodes of hypoglycemia on his 70/30. We'll DC 70/30 (old drug) and start patient on Lantus for his base coverage, continue patient's SSI to obese/resistance scale   Code Status: Full Family Communication:  Disposition Plan:     Consultants: Cardiology  Procedures:  Echocardiogram 11/22/2012 - Left ventricle: mildly dilated. LVEF=  45% to 50%., hypokinesis of the inferior posterior myocardium. Doppler parameters are consistent with high ventricular filling pressure. - Mitral valve: Calcified annulus. Mild regurgitation. - Left atrium: The atrium was mildly dilated. - Right ventricle: The cavity size was mildly dilated. - Right atrium: The atrium was mildly dilated. - Pulmonary arteries: Systolic pressure was mildly increased. PA peak pressure: 46mm Hg (S). Impressions:   08/20/2012 Dr. Lorine Bears from  [facility]    He was seen in January of this year rest pain involving the left leg with toe ulceration and early gangrenous changes. He underwent angiography which showed severe diffuse mid left SFA disease as well as one-vessel runoff below the knee with 99% stenosis in the anterior tibial artery which was the only artery supplying the foot.  He underwent a staged endovascular intervention on the left lower extremity under general anesthesia due  to inability to safely sedate him during his diagnostic angiography. The procedure was done via an antegrade access of the left femoral artery which was extremely difficult due to his obesity. I was able to perform balloon angioplasty on the left anterior tibial and tibioperoneal trunk. The left SFA was diffusely diseased and I placed 2 self-expanding stents.    Antibiotics:   HPI/Subjective: HPI: Philip Strong is a 72 y.o. male with known history of CAD status post CABG, peripheral vascular disease status post stenting, hypertension, hyperlipidemia, chronic anemia presented with complaints of shortness of breath and chest pain. Patient states he has been short of breath even at rest and off and on gets chest pressure. Denies any fever chills or any productive cough. Denies nausea vomiting abdominal pain. In the ER troponins were mildly elevated EKG showed  new-onset A. fib rate controlled with nonspecific T-wave changes. Due to patient's shortness of breath and persistent chest discomfort ER physician was concerned about a dissection and CT angiogram was done which was negative for dissection. Initially critical care and cardiology was consulted and at this time critical care signed off and requested hospitalist admission. In addition patient is found to be anemic more than his usual and as per the ER physician stool for occult blood was negative and patient denies any blood in the stools or black stools. Patient has been given IV Lasix and also PRBC transfusion has been ordered. Patient will be admitted for further management. TODAY states feeling well though is nervous about the a.m. tests/surgery for his left leg. States pain has been controlled   Objective: Filed Vitals:   11/25/12 1047 11/25/12 1352 11/25/12 2054 11/26/12 0601  BP: 126/50 118/40 120/58 118/54  Pulse: 50 67 64 62  Temp:  97.4 F (36.3 C) 97.9 F (36.6 C) 97.8 F (36.6 C)  TempSrc:  Oral Oral Oral  Resp: 18 18 20 20   Height: 6\' 3"  (1.905 m)     Weight:    130.137 kg (286 lb 14.4 oz)  SpO2: 100% 100% 98% 96%    Intake/Output Summary (Last 24 hours) at 11/26/12 4098 Last data filed at 11/26/12 0432  Gross per 24 hour  Intake   1200 ml  Output   1250 ml  Net    -50 ml   Filed Weights   11/24/12 0533 11/25/12 0545 11/26/12 0601  Weight: 128.096 kg (282 lb 6.4 oz) 131.4 kg (289 lb 11 oz) 130.137 kg (286 lb 14.4 oz)    Exam:   General: Alert,NAD  Cardiovascular: Regular rhythm and rate, negative murmurs rubs gallops, left PT/DP pulse nonpalpable  Respiratory: Clear to auscultation bilateral  Abdomen: Soft nontender nondistended plus bowel sounds  Musculoskeletal: Left foot appears to have dry gangrene in all 5 metatarsals, extending proximally to the ankle   Data Reviewed: Basic Metabolic Panel:  Recent Labs Lab 11/22/12 0800 11/22/12 2023 11/23/12 0620  11/24/12 0600 11/25/12 0525  NA 135 134* 138 133* 135  K 5.8* 4.4 3.8 3.9 4.0  CL 103 103 104 101 100  CO2 17* 18* 19 20 20   GLUCOSE 77 194* 48* 65* 125*  BUN 30* 35* 33* 39* 48*  CREATININE 1.47* 1.73* 1.58* 1.69* 1.72*  CALCIUM 8.6 8.4 8.6 8.4 8.4  MG  --  2.1 2.1 2.2 2.3   Liver Function Tests:  Recent Labs Lab 11/22/12 0800 11/22/12 2023 11/23/12 0620 11/24/12 0600 11/25/12 0525  AST 2296* 2085* 1182* 363* 165*  ALT 1586* 1496* 1294* 907* 653*  ALKPHOS 310* 366* 347* 318* 291*  BILITOT 0.9 0.6 0.7 0.5 0.4  PROT 8.1 8.2 8.0 8.2 8.1  ALBUMIN 2.0* 2.0* 2.1* 2.0* 2.0*   No results found for this basename: LIPASE, AMYLASE,  in the last 168 hours No results found for this basename: AMMONIA,  in the last 168 hours CBC:  Recent Labs Lab 11/22/12 0800 11/22/12 2023 11/23/12 0620 11/24/12 0600 11/25/12 0525  WBC 11.1* 8.0 8.5 9.5 9.7  NEUTROABS 8.6* 6.2 6.6 6.7 6.8  HGB 7.7* 8.2* 8.5* 8.2* 8.2*  HCT 22.4* 24.7* 25.6* 25.1* 25.6*  MCV 65.9* 67.9* 67.7* 68.8* 69.4*  PLT 369 253 231 182 194   Cardiac Enzymes:  Recent Labs Lab 11/22/12 0800 11/22/12 1426 11/22/12 1844  TROPONINI 0.55* 0.53* 0.41*   BNP (last 3 results)  Recent Labs  11/22/12 1426 11/23/12 1852  PROBNP 2633.0* 3413.0*   CBG:  Recent Labs Lab 11/25/12 0329 11/25/12 0402 11/25/12 0727 11/25/12 1144 11/25/12 1626  GLUCAP 64* 97 109* 143* 226*    Recent Results (from the past 240 hour(s))  MRSA PCR SCREENING     Status: None   Collection Time    11/22/12  1:47 AM      Result Value Range Status   MRSA by PCR NEGATIVE  NEGATIVE Final   Comment:            The GeneXpert MRSA Assay (FDA     approved for NASAL specimens     only), is one component of a     comprehensive MRSA colonization     surveillance program. It is not     intended to diagnose MRSA     infection nor to guide or     monitor treatment for     MRSA infections.     Studies: No results found.  Scheduled  Meds: . amLODipine  10 mg Oral Daily  . aspirin EC  325 mg Oral Daily  . famotidine  20 mg Oral Daily  . ferrous sulfate  325 mg Oral Q breakfast  . heparin  5,000 Units Subcutaneous Q8H  . insulin aspart  0-20 Units Subcutaneous Q4H  . insulin glargine  40 Units Subcutaneous Daily  . metoprolol succinate  100 mg Oral Q breakfast  . potassium chloride  10 mEq Oral Daily  . sodium chloride  3 mL Intravenous Q12H  . sodium chloride  3 mL Intravenous Q12H   Continuous Infusions:   Principal Problem:   CHF (congestive heart failure) Active Problems:   NSTEMI (non-ST elevated myocardial infarction)   Anemia   Atrial fibrillation   ARF (acute renal failure)   GI bleed   Nicotine dependence   Drug abuse, marijuana    Time spent: 40 minutes    Philip Strong, J  [facility] Pager [phone]. If 7PM-7AM, please contact night-coverage at [email or web address], password [facility] 11/26/2012, 6:32 AM  LOS: 5 days

## 2012-11-26 NOTE — Progress Notes (Signed)
Pt having pain in lt foot, current PRN not providing relief per PT, MD aware. Pt sch for producer in the AM

## 2012-11-26 NOTE — Progress Notes (Signed)
SUBJECTIVE: No chest pain. Breathing ok. Only c/o severe left foot pain at rest.   BP 118/54  Pulse 62  Temp(Src) 97.8 F (36.6 C) (Oral)  Resp 20  Ht 6\' 3"  (1.905 m)  Wt 286 lb 14.4 oz (130.137 kg)  BMI 35.86 kg/m2  SpO2 96%  Intake/Output Summary (Last 24 hours) at 11/26/12 0748 Last data filed at 11/26/12 0600  Gross per 24 hour  Intake   1560 ml  Output   1250 ml  Net    310 ml    PHYSICAL EXAM General: Well developed, well nourished, in no acute distress. Alert and oriented x 3.  Psych:  Good affect, responds appropriately Neck: No JVD. No masses noted.  Lungs: Clear bilaterally with no wheezes or rhonci noted.  Heart: Irregul, brady with no murmurs noted. Abdomen: Bowel sounds are present. Soft, non-tender.  Extremities: No lower extremity edema. Non palpable left DP/PT. Very tender toes   LABS: Basic Metabolic Panel:  Recent Labs  16/10/96 0525 11/26/12 0545  NA 135 136  K 4.0 4.0  CL 100 102  CO2 20 24  GLUCOSE 125* 137*  BUN 48* 41*  CREATININE 1.72* 1.36*  CALCIUM 8.4 8.7  MG 2.3 2.4   CBC:  Recent Labs  11/25/12 0525 11/26/12 0545  WBC 9.7 8.1  NEUTROABS 6.8 PENDING  HGB 8.2* 8.0*  HCT 25.6* 24.9*  MCV 69.4* 69.2*  PLT 194 PENDING   Cardiac Enzymes: No results found for this basename: CKTOTAL, CKMB, CKMBINDEX, TROPONINI,  in the last 72 hours Current Meds: . amLODipine  10 mg Oral Daily  . aspirin EC  325 mg Oral Daily  . famotidine  20 mg Oral Daily  . ferrous sulfate  325 mg Oral Q breakfast  . heparin  5,000 Units Subcutaneous Q8H  . insulin aspart  0-20 Units Subcutaneous Q4H  . insulin glargine  40 Units Subcutaneous Daily  . metoprolol succinate  100 mg Oral Q breakfast  . potassium chloride  10 mEq Oral Daily  . sodium chloride  3 mL Intravenous Q12H  . sodium chloride  3 mL Intravenous Q12H   Echo 11/22/12: Left ventricle: The cavity size was mildly dilated. Wall thickness was normal. Systolic function was  mildly reduced. The estimated ejection fraction was in the range of 45% to 50%. There is hypokinesis of the inferior posterior myocardium. Doppler parameters are consistent with high ventricular filling pressure. - Mitral valve: Calcified annulus. Mild regurgitation. - Left atrium: The atrium was mildly dilated. - Right ventricle: The cavity size was mildly dilated. - Right atrium: The atrium was mildly dilated. - Pulmonary arteries: Systolic pressure was mildly increased. PA peak pressure: 46mm Hg (S). Impressions:  - Technically difficult; there appears to be hypokinesis of the inferoposterior myocardium with mildly reduced LV function; suggest cardiac MR or MUGA if clinically indicated.  ASSESSMENT AND PLAN:  1. PAD with rest foot pain and early dry gangerne:very difficult case. I performed extensive angiography and stenting on him for very similar presentation. We could not sedate him and the procedure had to be done with general anesthesia.  Plan LE angiogram tomorrow.   2. CAD: pt is s/p CABG. Troponin mildly elevated on admission and trending down at last check. Felt to be Type II NSTEMI in setting of anemia. I don't think I will be able to do a heart cath at the same time of LE angiography as it will be difficult to sedate for a long period of  time and also too much contrast exposure after recent ARF.  Currently no chest pain. Continue medical therapy with risk stratification later if needed.   3. Acute on chronic combined systolic and diastolic CHF: His I/O are even since admission. Weight is up 2 lbs since admission. Creatinine is trending up over last 48 hours.Continue Lasix.   4. Atrial fibrillation: Appears to be new at time of this admission. Still in atrial fib with controlled rate. He has not been anti-coagulated. Hold for now with pending procedures. No a good candidate for anticoagulation due to anemia with positive occult blood in stool.     Lorine Bears  8/5/20147:48 AM

## 2012-11-27 ENCOUNTER — Encounter (HOSPITAL_COMMUNITY)
Admission: EM | Disposition: A | Discharge status: Home / Self Care | Payer: Self-pay | Source: Home / Self Care | Attending: Internal Medicine

## 2012-11-27 DIAGNOSIS — I5043 Acute on chronic combined systolic (congestive) and diastolic (congestive) heart failure: Secondary | ICD-10-CM

## 2012-11-27 DIAGNOSIS — I70219 Atherosclerosis of native arteries of extremities with intermittent claudication, unspecified extremity: Secondary | ICD-10-CM

## 2012-11-27 HISTORY — PX: LOWER EXTREMITY ANGIOGRAM: SHX5508

## 2012-11-27 LAB — CBC WITH DIFFERENTIAL/PLATELET
Basophils Absolute: 0 10*3/uL (ref 0.0–0.1)
HCT: 25.4 % — ABNORMAL LOW (ref 39.0–52.0)
Hemoglobin: 8.1 g/dL — ABNORMAL LOW (ref 13.0–17.0)
Lymphocytes Relative: 21 % (ref 12–46)
Monocytes Relative: 13 % — ABNORMAL HIGH (ref 3–12)
Neutro Abs: 4.9 10*3/uL (ref 1.7–7.7)
RDW: 22.4 % — ABNORMAL HIGH (ref 11.5–15.5)
WBC: 7.7 10*3/uL (ref 4.0–10.5)

## 2012-11-27 LAB — COMPREHENSIVE METABOLIC PANEL
Alkaline Phosphatase: 226 U/L — ABNORMAL HIGH (ref 39–117)
BUN: 30 mg/dL — ABNORMAL HIGH (ref 6–23)
Calcium: 8.8 mg/dL (ref 8.4–10.5)
GFR calc Af Amer: 74 mL/min — ABNORMAL LOW (ref >90)
Glucose, Bld: 88 mg/dL (ref 70–99)
Total Protein: 8.3 g/dL (ref 6.0–8.3)

## 2012-11-27 LAB — POCT ACTIVATED CLOTTING TIME
Activated Clotting Time: 206 seconds
Activated Clotting Time: 206 seconds
Activated Clotting Time: 186 seconds

## 2012-11-27 LAB — GLUCOSE, CAPILLARY
Glucose-Capillary: 156 mg/dL — ABNORMAL HIGH (ref 70–99)
Glucose-Capillary: 83 mg/dL (ref 70–99)
Glucose-Capillary: 114 mg/dL — ABNORMAL HIGH (ref 70–99)
Glucose-Capillary: 248 mg/dL — ABNORMAL HIGH (ref 70–99)

## 2012-11-27 LAB — MAGNESIUM: Magnesium: 2.3 mg/dL (ref 1.5–2.5)

## 2012-11-27 SURGERY — ANGIOGRAM, LOWER EXTREMITY
Anesthesia: LOCAL | Laterality: Left

## 2012-11-27 MED ORDER — FENTANYL CITRATE 0.05 MG/ML IJ SOLN
INTRAMUSCULAR | Status: AC
  Filled 2012-11-27: qty 2

## 2012-11-27 MED ORDER — MIDAZOLAM HCL 2 MG/2ML IJ SOLN
INTRAMUSCULAR | Status: AC
  Filled 2012-11-27: qty 2

## 2012-11-27 MED ORDER — SODIUM CHLORIDE 0.9 % IV SOLN: INTRAVENOUS | Status: AC

## 2012-11-27 MED ORDER — LIDOCAINE HCL (PF) 1 % IJ SOLN
INTRAMUSCULAR | Status: AC
  Filled 2012-11-27: qty 30

## 2012-11-27 MED ORDER — ONDANSETRON HCL 4 MG/2ML IJ SOLN
INTRAMUSCULAR | Status: AC
  Filled 2012-11-27: qty 2

## 2012-11-27 MED ORDER — HEPARIN SODIUM (PORCINE) 1000 UNIT/ML IJ SOLN
INTRAMUSCULAR | Status: AC
  Filled 2012-11-27: qty 1

## 2012-11-27 MED ORDER — CEFAZOLIN SODIUM-DEXTROSE 2-3 GM-% IV SOLR
2.0000 g | INTRAVENOUS | Status: DC
  Filled 2012-11-27: qty 50

## 2012-11-27 NOTE — Progress Notes (Signed)
[facility] PROGRESS NOTE  Philip Strong ZHY:[id number] DOB: 28-Jun-1940 DOA: 11/21/2012 PCP: [name], MD  Assessment/Plan: 1. 1. CHF; patient has poor understanding of disease process. Does not know what recommended base weight is. We'll aggressively diuresis. Admission weight= 287lbs, base weight = 282lbs (will use this as his base weight; creatinine beginning to creep up) current weight= not done yet -resume lasix post angio                                                             2.  Anemia; transfused 2 x unit PRBC upon admission H./H. continues to be Stable  3. NSTEMI; has been ruled out  4. ARF;  DC'd Lasix and Zaroxolyn continue to monitor patient's weight, creatinine. Creatinine  decreasing  5. GI bleed; H./H.   6. Nicotine Dependence; patient admits to smoking a pack a day x5 years stopped 3 weeks ago. Counseled extensively on not ever smoking again secondary to his multiple medical problems   7. Drug abuse; urinary tox screen (negative)        8. Left foot dry gangrene; patient now complaining of increased pain with past            week. Same issues have been addressed  08/20/2012 by Dr. Lorine Bears from  [facility]  who perform balloon angioplasty             on LT Anterior Tibial and Tibioperoneal trunk. The left SFA was diffusely             diseased and 2 self-expanding stents placed. Will consult vascular               Surgery. Left lower extremity arterial Doppler pending spoke with Dr. Arbie Cookey               (vascular surgery) who will determine course of treatment. For  angiogram at 10: 30 this AM         9. DM; will increase Lantus to 50 units each bedtime and continue patient's           SSI to obese/resistance scale              10. Foot pain; add oxycodone extended release 10 mg twice a day   Code Status: Full Family Communication: patient Disposition Plan:    Consultants: Cardiology  Procedures:  Echocardiogram  11/22/2012 - Left ventricle: mildly dilated. LVEF=  45% to 50%., hypokinesis of the inferior posterior myocardium. Doppler parameters are consistent with high ventricular filling pressure. - Mitral valve: Calcified annulus. Mild regurgitation. - Left atrium: The atrium was mildly dilated. - Right ventricle: The cavity size was mildly dilated. - Right atrium: The atrium was mildly dilated. - Pulmonary arteries: Systolic pressure was mildly increased. PA peak pressure: 46mm Hg (S). Impressions:   08/20/2012 Dr. Lorine Bears from  [facility]    He was seen in January of this year rest pain involving the left leg with toe ulceration and early gangrenous changes. He underwent angiography which showed severe diffuse mid left SFA disease as well as one-vessel runoff below the knee with 99% stenosis in the anterior tibial artery which was the only artery supplying the foot.  He underwent a staged endovascular intervention on the left lower extremity under general anesthesia due to inability to safely sedate him during his diagnostic angiography. The procedure was done via an antegrade access of the left femoral artery which was extremely difficult due to his obesity. I was able to perform balloon angioplasty on the left anterior tibial and tibioperoneal trunk. The left SFA was diffusely diseased and I placed 2 self-expanding stents.     HPI/Subjective: No new c/o No SOB, no CP   Objective: Filed Vitals:   11/26/12 1008 11/26/12 1425 11/26/12 2046 11/27/12 0515  BP: 146/62 129/67 132/59 140/71  Pulse: 64 73 63 70  Temp:  97.8 F (36.6 C) 97.5 F (36.4 C) 98 F (36.7 C)  TempSrc:  Oral Oral Oral  Resp:  20 18 20   Height:      Weight:    129.774 kg (286 lb 1.6 oz)  SpO2:  98% 98% 96%    Intake/Output Summary (Last 24 hours) at 11/27/12 0828 Last data filed at 11/27/12 0615  Gross per 24 hour  Intake    840 ml  Output   2075 ml  Net  -1235 ml   Filed Weights   11/25/12 0545  11/26/12 0601 11/27/12 0515  Weight: 131.4 kg (289 lb 11 oz) 130.137 kg (286 lb 14.4 oz) 129.774 kg (286 lb 1.6 oz)    Exam:   General: Alert,NAD  Cardiovascular: Regular rhythm and rate, negative murmurs rubs gallops, left PT/DP pulse nonpalpable  Respiratory: Clear to auscultation bilateral  Abdomen: Soft nontender nondistended plus bowel sounds  Musculoskeletal: Left foot appears to have dry gangrene in all 5 metatarsals, extending proximally to the ankle   Data Reviewed: Basic Metabolic Panel:  Recent Labs Lab 11/23/12 0620 11/24/12 0600 11/25/12 0525 11/26/12 0545 11/27/12 0500  NA 138 133* 135 136 135  K 3.8 3.9 4.0 4.0 4.0  CL 104 101 100 102 102  CO2 19 20 20 24 22   GLUCOSE 48* 65* 125* 137* 88  BUN 33* 39* 48* 41* 30*  CREATININE 1.58* 1.69* 1.72* 1.36* 1.12  CALCIUM 8.6 8.4 8.4 8.7 8.8  MG 2.1 2.2 2.3 2.4 2.3   Liver Function Tests:  Recent Labs Lab 11/23/12 0620 11/24/12 0600 11/25/12 0525 11/26/12 0545 11/27/12 0500  AST 1182* 363* 165* 76* 45*  ALT 1294* 907* 653* 478* 340*  ALKPHOS 347* 318* 291* 261* 226*  BILITOT 0.7 0.5 0.4 0.5 0.6  PROT 8.0 8.2 8.1 8.2 8.3  ALBUMIN 2.1* 2.0* 2.0* 2.1* 2.1*   No results found for this basename: LIPASE, AMYLASE,  in the last 168 hours No results found for this basename: AMMONIA,  in the last 168 hours CBC:  Recent Labs Lab 11/23/12 0620 11/24/12 0600 11/25/12 0525 11/26/12 0545 11/27/12 0500  WBC 8.5 9.5 9.7 8.1 7.7  NEUTROABS 6.6 6.7 6.8 5.6 4.9  HGB 8.5* 8.2* 8.2* 8.0* 8.1*  HCT 25.6* 25.1* 25.6* 24.9* 25.4*  MCV 67.7* 68.8* 69.4* 69.2* 69.0*  PLT 231 182 194 212 206   Cardiac Enzymes:  Recent Labs Lab 11/22/12 0800 11/22/12 1426 11/22/12 1844  TROPONINI 0.55* 0.53* 0.41*   BNP (last 3 results)  Recent Labs  11/22/12 1426 11/23/12 1852  PROBNP 2633.0* 3413.0*   CBG:  Recent Labs Lab 11/26/12 1143 11/26/12 1633 11/26/12 2008 11/27/12 0003 11/27/12 0402  GLUCAP 225* 352*  353* 156* 83    Recent Results (from the past 240 hour(s))  MRSA PCR SCREENING  Status: None   Collection Time    11/22/12  1:47 AM      Result Value Range Status   MRSA by PCR NEGATIVE  NEGATIVE Final   Comment:            The GeneXpert MRSA Assay (FDA     approved for NASAL specimens     only), is one component of a     comprehensive MRSA colonization     surveillance program. It is not     intended to diagnose MRSA     infection nor to guide or     monitor treatment for     MRSA infections.     Studies: No results found.  Scheduled Meds: . amLODipine  10 mg Oral Daily  . aspirin EC  325 mg Oral Daily  . famotidine  20 mg Oral Daily  . ferrous sulfate  325 mg Oral Q breakfast  . heparin  5,000 Units Subcutaneous Q8H  . insulin aspart  0-20 Units Subcutaneous Q4H  . insulin glargine  50 Units Subcutaneous Daily  . metoprolol succinate  100 mg Oral Q breakfast  . OxyCODONE  10 mg Oral Q12H  . potassium chloride  10 mEq Oral Daily  . sodium chloride  3 mL Intravenous Q12H  . sodium chloride  3 mL Intravenous Q12H   Continuous Infusions: . sodium chloride 75 mL/hr at 11/27/12 1610    Principal Problem:   CHF (congestive heart failure) Active Problems:   NSTEMI (non-ST elevated myocardial infarction)   Anemia   Atrial fibrillation   ARF (acute renal failure)   GI bleed   Nicotine dependence   Drug abuse, marijuana    Time spent: 35 minutes    VANN, JESSICA  [facility] Pager [phone]. If 7PM-7AM, please contact night-coverage at [email or web address], password [facility] 11/27/2012, 8:28 AM  LOS: 6 days

## 2012-11-27 NOTE — Progress Notes (Signed)
Pt with IV out this AM. IVput in by IV team. IV team notified to come put new IV in for pt going to procedure in AM. Will continue to monitor and assess. Baron Hamper, RN 11/27/2012

## 2012-11-27 NOTE — Progress Notes (Signed)
Patient ID: Philip Strong, male   DOB: 1941/04/11, 72 y.o.   MRN: [id number] Reviewed the films with Dr.Arida. His tibioperoneal trunk and anterior tibial angioplasty looks quite good today. He has had recurrent stenosis in his superficial femoral artery which was treated with angioplasty. It is causing while he has such severe pain with a stenotic but patent SFA and good anterior tibial runoff. Hopefully his intervention today will improve his flow. Will not follow actively. Available for assistance as needed.

## 2012-11-27 NOTE — Progress Notes (Signed)
SUBJECTIVE: No chest pain. Breathing ok. Only c/o severe left foot pain at rest. Weight is stable at 286   BP 140/71  Pulse 70  Temp(Src) 98 F (36.7 C) (Oral)  Resp 20  Ht 6\' 3"  (1.905 m)  Wt 129.774 kg (286 lb 1.6 oz)  BMI 35.76 kg/m2  SpO2 96%  Intake/Output Summary (Last 24 hours) at 11/27/12 1610 Last data filed at 11/27/12 0615  Gross per 24 hour  Intake    840 ml  Output   2075 ml  Net  -1235 ml    PHYSICAL EXAM General: Well developed, well nourished, in no acute distress. Alert and oriented x 3.  Psych:  Good affect, responds appropriately Neck: No JVD. No masses noted.  Lungs: Clear bilaterally with no wheezes or rhonci noted.  Heart: Irregul, brady with no murmurs noted. Abdomen: Bowel sounds are present. Soft, non-tender.  Extremities: No lower extremity edema. Non palpable left DP/PT. Very tender toes   LABS: Basic Metabolic Panel:  Recent Labs  96/04/54 0545 11/27/12 0500  NA 136 135  K 4.0 4.0  CL 102 102  CO2 24 22  GLUCOSE 137* 88  BUN 41* 30*  CREATININE 1.36* 1.12  CALCIUM 8.7 8.8  MG 2.4 2.3   CBC:  Recent Labs  11/25/12 0525 11/26/12 0545  WBC 9.7 8.1  NEUTROABS 6.8 5.6  HGB 8.2* 8.0*  HCT 25.6* 24.9*  MCV 69.4* 69.2*  PLT 194 212   Cardiac Enzymes: No results found for this basename: CKTOTAL, CKMB, CKMBINDEX, TROPONINI,  in the last 72 hours Current Meds: . amLODipine  10 mg Oral Daily  . aspirin EC  325 mg Oral Daily  . famotidine  20 mg Oral Daily  . ferrous sulfate  325 mg Oral Q breakfast  . heparin  5,000 Units Subcutaneous Q8H  . insulin aspart  0-20 Units Subcutaneous Q4H  . insulin glargine  50 Units Subcutaneous Daily  . metoprolol succinate  100 mg Oral Q breakfast  . OxyCODONE  10 mg Oral Q12H  . potassium chloride  10 mEq Oral Daily  . sodium chloride  3 mL Intravenous Q12H  . sodium chloride  3 mL Intravenous Q12H   Echo 11/22/12: Left ventricle: The cavity size was mildly dilated. Wall thickness was  normal. Systolic function was mildly reduced. The estimated ejection fraction was in the range of 45% to 50%. There is hypokinesis of the inferior posterior myocardium. Doppler parameters are consistent with high ventricular filling pressure. - Mitral valve: Calcified annulus. Mild regurgitation. - Left atrium: The atrium was mildly dilated. - Right ventricle: The cavity size was mildly dilated. - Right atrium: The atrium was mildly dilated. - Pulmonary arteries: Systolic pressure was mildly increased. PA peak pressure: 46mm Hg (S). Impressions:  - Technically difficult; there appears to be hypokinesis of the inferoposterior myocardium with mildly reduced LV function; suggest cardiac MR or MUGA if clinically indicated.  ASSESSMENT AND PLAN:  1. PAD with rest foot pain and early dry gangerne:very difficult case. I performed extensive angiography and stenting on him for very similar presentation. We could not sedate him and the procedure had to be done with general anesthesia.  Plan LE angiogram today at 10:30 am. If surgical revascularization/amputation is required, cardiac risk stratification will need to be performed.   2. CAD: pt is s/p CABG. Troponin mildly elevated on admission and trending down at last check. Felt to be Type II NSTEMI in setting of anemia. I don't think  I will be able to do a heart cath at the same time of LE angiography as it will be difficult to sedate for a long period of time and also too much contrast exposure after recent ARF.  Currently no chest pain. Continue medical therapy with risk stratification later if needed.   3. Acute on chronic combined systolic and diastolic CHF: His I/O are even since admission. Weight is up 4 lbs above baseline. Will resume Lasix post angio.   4. Atrial fibrillation: Appears to be new at time of this admission. Seems to be in sinus rhythm now with sinus bradycardia. No a good candidate for anticoagulation due to anemia with  positive occult blood in stool.     Lorine Bears  8/6/20147:05 AM

## 2012-11-27 NOTE — Clinical Documentation Improvement (Signed)
THIS DOCUMENT IS NOT A PERMANENT PART OF THE MEDICAL RECORD  Please update your documentation with the medical record to reflect your response to this query. If you need help knowing how to do this please call [phone].  11/27/12  Dr. Benjamine Mola,  In an effort to better capture your patient's severity of illness, reflect appropriate length of stay and utilization of resources, a review of the patient medical record has revealed the following indicators:    - Carries Symptomatic Anemia as a diagnosis this admission   - Transfused 1 unit of packed red blood cells this admission   - Stool for Occult Blood + this admission     Based on your clinical judgment, please document the Probable, Suspected or Known Cause of the patient's symptomatic anemia in the progress notes and discharge summary:   - Acute Blood Loss Anemia   - Acute on Chronic Blood Loss Anemia   - Anemia of Chronic Disease   - Other Type of Anemia   - Unable to Clinically Determine   In responding to this query please exercise your independent judgment.    The fact that a query is asked, does not imply that any particular answer is desired or expected.   Reviewed: additional documentation in the medical record  Thank You,  Jerral Ralph  RN BSN CCDS Certified Clinical Documentation Specialist: [phone] Health Information Management [place]

## 2012-11-27 NOTE — Progress Notes (Signed)
Pt a/o, NPO for procedure, st stable, will monitor

## 2012-11-27 NOTE — CV Procedure (Signed)
PERIPHERAL VASCULAR PROCEDURE  NAME:  Philip Strong   MRN: [id number] DOB:  10/07/1940   ADMIT DATE: 11/21/2012  Performing Cardiologist: Lorine Bears Primary Physician: [name], MD  Procedures Performed:   Bilateral iliac angiography  Selective left lower extremity angiography  Antegrade access of the left common femoral artery  Balloon angioplasty of the mid SFA for severe instent restenosis  Mynx closure device    Indication(s):   Claudication  Critical Limb Ischemia   Consent: The procedure with Risks/Benefits/Alternatives and Indications was reviewed with the patient .  All questions were answered.  Medications:  Sedation:  4 mg IV Versed, 200 mcg IV Fentanyl  Contrast:  101 mL  Visipaque   Procedural details: The right groin was prepped, draped, and anesthetized with 1% lidocaine. Using modified Seldinger technique, a 5 French sheath was introduced into the right common femoral artery. A 5 Fr Short Pigtail Catheter was advanced of over a  Versicore wire into the distal descending Aorta.   A power injection of 56ml/sec contrast over 1 sec was performed for iliac Angiography.    The pigtail catheter was changed over the Versicore wire for A crossover catheter which was then pulled back the aortic bifurcation and the wire was advanced down the contralateral common iliac artery. There was significant difficulty in advancing the wire due to a high and steep angulation of the iliac bifurcation. This was in spite of using multiple wires going all the way down to the left SFA. I was able to advance the catheter to the distal left common iliac artery. Contralateral second-order lower extremity angiography was performed via power injection of 5 ml / sec contrast for a total of 35 ml.  I then tried to advance the wire to the left SFA in order of the advance the sheath up and over. The catheter would not advance and thus this was exchanged into an 035 quick cross  catheter which was advanced over the way to the proximal left SFA. I tried to advance a supportive wire but was not able to do that as it kept pushing the catheter up to the distal aorta. This was in spite of multiple attempts.  Due to that, this was aborted and I decided to proceed with an antegrade access of the left common femoral artery which was done with a micropuncture needle and sheath. There was significant difficulty in advancing the sheath due to kinking. I exchanged into a Lunderquist wire for support and was able to advance a 6 [place] destination sheath which was placed in the mid SFA. The patient was given a total of 10,000 units of unfractionated heparin to achieve an ACT above 200. The lesion in the SFA was crossed easily with a [place] core wire. The restenosis was treated with a 5 x 120 mm chocolate balloon for 10 atmosphere x2 along the inflations for 2 minutes each. Angiography showed very good results with mild residual disease.  Hemostasis on the left side was achieved with a Mynx closure device. The patient tolerated the procedure well with no immediate complications. The sheath in the right groin was left to be removed manually. The procedure was extremely difficult as again it was difficult to sedate the patient will get moving. There was consensus where we couldn't keep the field sterile. Thus, I gave him 2 g of Ancef. There was also an significant difficulty due to access issues.   Hemodynamics:  Central Aortic Pressure / Mean Aortic Pressure: 174/49  Findings:  Right common iliac artery: Minor irregularities.  Right internal iliac artery: Patent with significant proximal disease.  Right external iliac artery: Minor irregularities.  Left common iliac artery:  Significantly tortuous and calcified with 20% stenosis.  Left internal iliac artery: Patent with significant proximal disease.  Left external iliac artery:  Mild nonobstructive disease.  Left common femoral  artery: Diffuse 20% disease.  Left profunda femoral artery: Patent with diffuse 20-40% disease.  Left superficial femoral artery:  Calcified with 40% ostial stenosis. 2 overlapped stents noted in the mid area with 90% in-stent restenosis.  Left popliteal artery: Minor irregularities.  Left tibial peroneal trunk: Patent with no significant disease.  Left anterior tibial artery: Patent with 30-40% disease in the midsegment.  Left peroneal artery: Occluded  Left posterior tibial artery: Occluded  Conclusions: 1. 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 Patent tibial peroneal trunk and left anterior tibial artery. 3. Successful balloon angioplasty to the left SFA.  Recommendations:  Due to significant anemia and heme positive stool, I will not treat the patient with Plavix given that no new stent was placed. Continue aspirin daily. The patient is at significant risk for future restenosis. Resume diuresis with Lasix tomorrow.   Lorine Bears, MD, [facility] 11/27/2012 1:06 PM

## 2012-11-28 LAB — COMPREHENSIVE METABOLIC PANEL
ALT: 239 U/L — ABNORMAL HIGH (ref 0–53)
AST: 36 U/L (ref 0–37)
Albumin: 2 g/dL — ABNORMAL LOW (ref 3.5–5.2)
Alkaline Phosphatase: 198 U/L — ABNORMAL HIGH (ref 39–117)
Chloride: 102 mEq/L (ref 96–112)
Potassium: 4.3 mEq/L (ref 3.5–5.1)
Sodium: 137 mEq/L (ref 135–145)
Total Bilirubin: 0.6 mg/dL (ref 0.3–1.2)
Total Protein: 8.4 g/dL — ABNORMAL HIGH (ref 6.0–8.3)

## 2012-11-28 LAB — CBC WITH DIFFERENTIAL/PLATELET
Basophils Absolute: 0 10*3/uL (ref 0.0–0.1)
Eosinophils Relative: 1 % (ref 0–5)
HCT: 26.1 % — ABNORMAL LOW (ref 39.0–52.0)
Lymphs Abs: 1.8 10*3/uL (ref 0.7–4.0)
MCH: 22.8 pg — ABNORMAL LOW (ref 26.0–34.0)
MCV: 69.2 fL — ABNORMAL LOW (ref 78.0–100.0)
Monocytes Absolute: 0.8 10*3/uL (ref 0.1–1.0)
Monocytes Relative: 9 % (ref 3–12)
Neutro Abs: 6.5 10*3/uL (ref 1.7–7.7)
Platelets: 254 10*3/uL (ref 150–400)
RDW: 22.5 % — ABNORMAL HIGH (ref 11.5–15.5)

## 2012-11-28 LAB — GLUCOSE, CAPILLARY
Glucose-Capillary: 170 mg/dL — ABNORMAL HIGH (ref 70–99)
Glucose-Capillary: 222 mg/dL — ABNORMAL HIGH (ref 70–99)
Glucose-Capillary: 311 mg/dL — ABNORMAL HIGH (ref 70–99)
Glucose-Capillary: 92 mg/dL (ref 70–99)
Glucose-Capillary: 94 mg/dL (ref 70–99)

## 2012-11-28 MED ORDER — FUROSEMIDE 10 MG/ML IJ SOLN
80.0000 mg | Freq: Two times a day (BID) | INTRAMUSCULAR | Status: DC
  Administered 2012-11-28 – 2012-11-29 (×4): 80 mg via INTRAVENOUS
  Filled 2012-11-28 (×7): qty 8

## 2012-11-28 NOTE — Progress Notes (Signed)
8/7  Recommend changing NovologRESISTANT correction scale to [facility] when patient is eating.    Smith Mince RN BSN CDE

## 2012-11-28 NOTE — Progress Notes (Signed)
[facility] PROGRESS NOTE  Philip Strong FAO:[id number] DOB: 02/10/1941 DOA: 11/21/2012 PCP: [name], MD  Assessment/Plan: CHF; patient has poor understanding of disease process. Does not know what recommended base weight is. aggressive diuresis. Admission weight= 287lbs, base weight = 282lbs?  -resume lasix post angio                                                             Anemia- probably a combination of ABLA and anemia of CD; transfused 2 x unit PRBC upon admission H./H. continues to be Stable-- on Fe  NSTEMI; troponin trending down  ARF;   Creatinine  Decreasing- monitor now that lasix restarted  GI bleed; H./H. stable  Nicotine Dependence; patient admits to smoking a pack a day x5 years stopped 3 weeks ago. Counseled extensively on not ever smoking again secondary to his multiple medical problems         Left foot dry gangrene; patient now complaining of increased pain with past            week. Same issues have been addressed  08/20/2012 by Dr. Lorine Strong from  [facility]  who perform balloon angioplasty             on LT Anterior Tibial and Tibioperoneal trunk. The left SFA was diffusely             diseased and 2 self-expanding stents placed. Will consult vascular               Surgery. Left lower extremity arterial Doppler pending spoke with Dr. Arbie Cookey               (vascular surgery) who will determine course of treatment. Had  angiogram on 8/6: Significant in-stent restenosis in the left SFA  (90%).  Patent tibial peroneal trunk and left anterior tibial artery.  Successful balloon angioplasty to the left SFA.  ASA only due to heme + stools  -will need GI outpatient follow up        DM; will increase Lantus to 50 units each bedtime and continue patient's           SSI to obese/resistance scale              Foot pain; add oxycodone extended release 10 mg twice a day; plus PRN   Code Status: Full Family Communication:  patient Disposition Plan:    Consultants: Cardiology  Procedures:  Echocardiogram 11/22/2012 - Left ventricle: mildly dilated. LVEF=  45% to 50%., hypokinesis of the inferior posterior myocardium. Doppler parameters are consistent with high ventricular filling pressure. - Mitral valve: Calcified annulus. Mild regurgitation. - Left atrium: The atrium was mildly dilated. - Right ventricle: The cavity size was mildly dilated. - Right atrium: The atrium was mildly dilated. - Pulmonary arteries: Systolic pressure was mildly increased. PA peak pressure: 46mm Hg (S). Impressions:   08/20/2012 Dr. Lorine Strong from  [facility]    He was seen in January of this year rest pain involving the left leg with toe ulceration and early gangrenous changes. He underwent angiography which showed severe diffuse mid left SFA disease as well as one-vessel runoff below the knee with 99% stenosis in the anterior tibial  artery which was the only artery supplying the foot.  He underwent a staged endovascular intervention on the left lower extremity under general anesthesia due to inability to safely sedate him during his diagnostic angiography. The procedure was done via an antegrade access of the left femoral artery which was extremely difficult due to his obesity. I was able to perform balloon angioplasty on the left anterior tibial and tibioperoneal trunk. The left SFA was diffusely diseased and I placed 2 self-expanding stents.     HPI/Subjective: C/o some foot pain No fever, no chills  Objective: Filed Vitals:   11/27/12 1947 11/28/12 0001 11/28/12 0410 11/28/12 0809  BP: 152/61 168/72 136/48 160/69  Pulse: 63 75 82 78  Temp: 97.4 F (36.3 C) 98 F (36.7 C) 98.7 F (37.1 C) 97.9 F (36.6 C)  TempSrc: Oral Oral Oral Oral  Resp: 15 17 14 18   Height:      Weight:  130.3 kg (287 lb 4.2 oz)    SpO2: 96% 100% 96% 96%    Intake/Output Summary (Last 24 hours) at 11/28/12 0818 Last data  filed at 11/28/12 0648  Gross per 24 hour  Intake 1252.5 ml  Output   2100 ml  Net -847.5 ml   Filed Weights   11/26/12 0601 11/27/12 0515 11/28/12 0001  Weight: 130.137 kg (286 lb 14.4 oz) 129.774 kg (286 lb 1.6 oz) 130.3 kg (287 lb 4.2 oz)    Exam:   General: Alert,NAD  Cardiovascular: Regular rhythm and rate, negative murmurs rubs gallops, left PT/DP pulse nonpalpable  Respiratory: Clear to auscultation bilateral  Abdomen: Soft nontender nondistended plus bowel sounds  Musculoskeletal: Left foot appears to have dry gangrene in all 5 metatarsals,    Data Reviewed: Basic Metabolic Panel:  Recent Labs Lab 11/24/12 0600 11/25/12 0525 11/26/12 0545 11/27/12 0500 11/28/12 0520  NA 133* 135 136 135 137  K 3.9 4.0 4.0 4.0 4.3  CL 101 100 102 102 102  CO2 20 20 24 22 23   GLUCOSE 65* 125* 137* 88 78  BUN 39* 48* 41* 30* 22  CREATININE 1.69* 1.72* 1.36* 1.12 1.03  CALCIUM 8.4 8.4 8.7 8.8 8.9  MG 2.2 2.3 2.4 2.3 2.3   Liver Function Tests:  Recent Labs Lab 11/24/12 0600 11/25/12 0525 11/26/12 0545 11/27/12 0500 11/28/12 0520  AST 363* 165* 76* 45* 36  ALT 907* 653* 478* 340* 239*  ALKPHOS 318* 291* 261* 226* 198*  BILITOT 0.5 0.4 0.5 0.6 0.6  PROT 8.2 8.1 8.2 8.3 8.4*  ALBUMIN 2.0* 2.0* 2.1* 2.1* 2.0*   No results found for this basename: LIPASE, AMYLASE,  in the last 168 hours No results found for this basename: AMMONIA,  in the last 168 hours CBC:  Recent Labs Lab 11/24/12 0600 11/25/12 0525 11/26/12 0545 11/27/12 0500 11/28/12 0520  WBC 9.5 9.7 8.1 7.7 9.2  NEUTROABS 6.7 6.8 5.6 4.9 6.5  HGB 8.2* 8.2* 8.0* 8.1* 8.6*  HCT 25.1* 25.6* 24.9* 25.4* 26.1*  MCV 68.8* 69.4* 69.2* 69.0* 69.2*  PLT 182 194 212 206 254   Cardiac Enzymes:  Recent Labs Lab 11/22/12 0800 11/22/12 1426 11/22/12 1844  TROPONINI 0.55* 0.53* 0.41*   BNP (last 3 results)  Recent Labs  11/22/12 1426 11/23/12 1852  PROBNP 2633.0* 3413.0*   CBG:  Recent Labs Lab  11/27/12 1337 11/27/12 1813 11/27/12 2002 11/28/12 0001 11/28/12 0411  GLUCAP 127* 248* 317* 170* 94    Recent Results (from the past 240 hour(s))  MRSA PCR  SCREENING     Status: None   Collection Time    11/22/12  1:47 AM      Result Value Range Status   MRSA by PCR NEGATIVE  NEGATIVE Final   Comment:            The GeneXpert MRSA Assay (FDA     approved for NASAL specimens     only), is one component of a     comprehensive MRSA colonization     surveillance program. It is not     intended to diagnose MRSA     infection nor to guide or     monitor treatment for     MRSA infections.     Studies: No results found.  Scheduled Meds: . amLODipine  10 mg Oral Daily  . aspirin EC  325 mg Oral Daily  . famotidine  20 mg Oral Daily  . ferrous sulfate  325 mg Oral Q breakfast  . furosemide  80 mg Intravenous Q12H  . heparin  5,000 Units Subcutaneous Q8H  . insulin aspart  0-20 Units Subcutaneous Q4H  . insulin glargine  50 Units Subcutaneous Daily  . metoprolol succinate  100 mg Oral Q breakfast  . OxyCODONE  10 mg Oral Q12H  . potassium chloride  10 mEq Oral Daily  . sodium chloride  3 mL Intravenous Q12H   Continuous Infusions:    Principal Problem:   CHF (congestive heart failure) Active Problems:   NSTEMI (non-ST elevated myocardial infarction)   Anemia   Atrial fibrillation   ARF (acute renal failure)   GI bleed   Nicotine dependence   Drug abuse, marijuana    Time spent: 35 minutes    Charliegh Vasudevan  [facility] Pager [phone]. If 7PM-7AM, please contact night-coverage at [email or web address], password [facility] 11/28/2012, 8:18 AM  LOS: 7 days

## 2012-11-28 NOTE — Plan of Care (Signed)
Problem: Phase II Progression Outcomes Goal: Barriers To Progression Addressed/Resolved Outcome: Progressing Patient has 2+ edema left lower extremity. IV lasix 80 mg bid given for edema. See physician notes for more information.

## 2012-11-28 NOTE — Clinical Documentation Improvement (Signed)
THIS DOCUMENT IS NOT A PERMANENT PART OF THE MEDICAL RECORD  Please update your documentation within the medical record to reflect your response to this query. If you need help knowing how to do this please call [phone].  11/28/12  Dr. Benjamine Mola,  In a better effort to capture your patient's severity of illness, reflect appropriate length of stay and utilization of resources, a review of the medical record has revealed the following abnormal labs:                          11/22/12          11/22/12          11/23/12          11/24/12          11/25/12          11/512 AST                   2296                 2085               1182                  363                   165                  76 ALT                   1586                 1496               1294                  907                    653                 478 Alk Phos            310                    366                 347                  318                    291                  261                          11/27/12           0807/15 AST                    45                    36 ALT                    340                  239 Alk Phos  226                  198   Please document the clinical significance, if any, related to the abnormal LFT's in the progress notes and discharge summary.   In responding to this query please exercise your independent judgment.     The fact that a query is asked, does not imply that any particular answer is desired or expected.     Reviewed: 12/02/12 - "marked transaminitis: continues to improve. Felt 2/2 statins." per dr. Tenny Craw pn 11/30/12.  Mathis Dad RN   Thank You,  Jerral Ralph  RN BSN CCDS Certified Clinical Documentation Specialist: [phone] [employer] Information Management [place]

## 2012-11-28 NOTE — Progress Notes (Signed)
    SUBJECTIVE: Only c/o swelling in left foot and pain in left foot. Pain is better post PTA.   BP 136/48  Pulse 82  Temp(Src) 98.7 F (37.1 C) (Oral)  Resp 14  Ht 6\' 3"  (1.905 m)  Wt 287 lb 4.2 oz (130.3 kg)  BMI 35.9 kg/m2  SpO2 96%  Intake/Output Summary (Last 24 hours) at 11/28/12 0724 Last data filed at 11/28/12 8657  Gross per 24 hour  Intake 1252.5 ml  Output   2100 ml  Net -847.5 ml    PHYSICAL EXAM General: Well developed, well nourished, in no acute distress. Alert and oriented x 3.  Psych:  Good affect, responds appropriately Neck: No JVD. No masses noted.  Lungs: Clear bilaterally with no wheezes or rhonci noted.  Heart: RRR with no murmurs noted. Abdomen: Bowel sounds are present. Soft, non-tender.  Extremities: 2+ left lower extremity edema. Right BKA  LABS: Basic Metabolic Panel:  Recent Labs  84/69/62 0500 11/28/12 0520  NA 135 137  K 4.0 4.3  CL 102 102  CO2 22 23  GLUCOSE 88 78  BUN 30* 22  CREATININE 1.12 1.03  CALCIUM 8.8 8.9  MG 2.3 2.3   CBC:  Recent Labs  11/27/12 0500 11/28/12 0520  WBC 7.7 9.2  NEUTROABS 4.9 6.5  HGB 8.1* 8.6*  HCT 25.4* 26.1*  MCV 69.0* 69.2*  PLT 206 254   Current Meds: . amLODipine  10 mg Oral Daily  . aspirin EC  325 mg Oral Daily  . famotidine  20 mg Oral Daily  . ferrous sulfate  325 mg Oral Q breakfast  . heparin  5,000 Units Subcutaneous Q8H  . insulin aspart  0-20 Units Subcutaneous Q4H  . insulin glargine  50 Units Subcutaneous Daily  . metoprolol succinate  100 mg Oral Q breakfast  . OxyCODONE  10 mg Oral Q12H  . potassium chloride  10 mEq Oral Daily  . sodium chloride  3 mL Intravenous Q12H     ASSESSMENT AND PLAN:  1. PAD with rest foot pain and early dry gangerne: s/p PTA of in stent restenosis left SFA yesterday per Dr. Kirke Corin. Will continue ASA only with heme positive stools.    2. CAD: pt is s/p CABG. Troponin mildly elevated on admission and trending down at last check. Felt to  be Type II NSTEMI in setting of anemia. Currently no chest pain. Continue medical therapy with risk stratification later if needed.   3. Acute on chronic combined systolic and diastolic CHF: His I/O negative 1.8 liters since admission with stable weight. Resume IV Lasix today.    4. Atrial fibrillation: Appears to be new at time of this admission. Sinus this am.  Not a good candidate for anticoagulation due to anemia with positive occult blood in stool.    Philip Strong  8/7/20147:24 AM

## 2012-11-29 ENCOUNTER — Inpatient Hospital Stay (HOSPITAL_COMMUNITY): Payer: Medicare Other

## 2012-11-29 ENCOUNTER — Encounter (HOSPITAL_COMMUNITY): Payer: Self-pay | Admitting: Physician Assistant

## 2012-11-29 LAB — CBC WITH DIFFERENTIAL/PLATELET
Basophils Absolute: 0 10*3/uL (ref 0.0–0.1)
Basophils Relative: 0 % (ref 0–1)
Eosinophils Absolute: 0.2 10*3/uL (ref 0.0–0.7)
HCT: 25.8 % — ABNORMAL LOW (ref 39.0–52.0)
Hemoglobin: 8.5 g/dL — ABNORMAL LOW (ref 13.0–17.0)
Lymphocytes Relative: 22 % (ref 12–46)
Lymphs Abs: 1.7 10*3/uL (ref 0.7–4.0)
MCH: 22.5 pg — ABNORMAL LOW (ref 26.0–34.0)
MCHC: 32.9 g/dL (ref 30.0–36.0)
MCV: 68.4 fL — ABNORMAL LOW (ref 78.0–100.0)
Monocytes Absolute: 0.8 10*3/uL (ref 0.1–1.0)
Neutro Abs: 5.1 10*3/uL (ref 1.7–7.7)
RDW: 22.2 % — ABNORMAL HIGH (ref 11.5–15.5)

## 2012-11-29 LAB — COMPREHENSIVE METABOLIC PANEL
ALT: 164 U/L — ABNORMAL HIGH (ref 0–53)
AST: 30 U/L (ref 0–37)
Albumin: 2 g/dL — ABNORMAL LOW (ref 3.5–5.2)
Alkaline Phosphatase: 174 U/L — ABNORMAL HIGH (ref 39–117)
Calcium: 8.9 mg/dL (ref 8.4–10.5)
Glucose, Bld: 159 mg/dL — ABNORMAL HIGH (ref 70–99)
Potassium: 4 mEq/L (ref 3.5–5.1)
Sodium: 136 mEq/L (ref 135–145)
Total Protein: 8.1 g/dL (ref 6.0–8.3)

## 2012-11-29 LAB — GLUCOSE, CAPILLARY
Glucose-Capillary: 55 mg/dL — ABNORMAL LOW (ref 70–99)
Glucose-Capillary: 93 mg/dL (ref 70–99)
Glucose-Capillary: 175 mg/dL — ABNORMAL HIGH (ref 70–99)
Glucose-Capillary: 298 mg/dL — ABNORMAL HIGH (ref 70–99)

## 2012-11-29 MED ORDER — INSULIN ASPART 100 UNIT/ML ~~LOC~~ SOLN
0.0000 [IU] | Freq: Every day | SUBCUTANEOUS | Status: DC
  Administered 2012-11-29: 3 [IU] via SUBCUTANEOUS

## 2012-11-29 MED ORDER — INSULIN ASPART 100 UNIT/ML ~~LOC~~ SOLN
0.0000 [IU] | Freq: Three times a day (TID) | SUBCUTANEOUS | Status: DC
  Administered 2012-11-29: 5 [IU] via SUBCUTANEOUS
  Administered 2012-11-29: 19:00:00 8 [IU] via SUBCUTANEOUS
  Administered 2012-11-30: 09:00:00 3 [IU] via SUBCUTANEOUS

## 2012-11-29 NOTE — Progress Notes (Signed)
[facility] PROGRESS NOTE  Philip Strong ZOX:[id number] DOB: 1941/04/01 DOA: 11/21/2012 PCP: [name], MD  Assessment/Plan: CHF; patient has poor understanding of disease process. Does not know what recommended base weight is. aggressive diuresis. Admission weight= 287lbs, base weight = 282lbs?  -resume lasix post angio- await daily weight                                                             Anemia- probably a combination of ABLA and anemia of CD; transfused 2 x unit PRBC upon admission H./H. continues to be Stable-- on Fe  NSTEMI; troponin trending down  ARF;   Creatinine  Decreasing- monitor now that lasix restarted  GI bleed; H./H. stable  Nicotine Dependence; patient admits to smoking a pack a day x5 years stopped 3 weeks ago. Counseled extensively on not ever smoking again secondary to his multiple medical problems         Left foot dry gangrene; patient now complaining of increased pain with past            week. Same issues have been addressed  08/20/2012 by Dr. Lorine Bears from  [facility]  who perform balloon angioplasty             on LT Anterior Tibial and Tibioperoneal trunk. The left SFA was diffusely             diseased and 2 self-expanding stents placed. Will consult vascular               Surgery. Left lower extremity arterial Doppler pending spoke with Dr. Arbie Cookey               (vascular surgery) who will determine course of treatment. Had  angiogram on 8/6: Significant in-stent restenosis in the left SFA  (90%).  Patent tibial peroneal trunk and left anterior tibial artery.  Successful balloon angioplasty to the left SFA.  ASA only due to heme + stools  -will need GI outpatient follow up        DM; will increase Lantus to 50 units each bedtime and continue patient's           SSI to obese/resistance scale              Foot pain; add oxycodone extended release 10 mg twice a day; plus PRN   Code Status: Full Family  Communication: patient Disposition Plan: patient does not want placement or home health- has family who helps   Consultants: Cardiology  Procedures:  Echocardiogram 11/22/2012 - Left ventricle: mildly dilated. LVEF=  45% to 50%., hypokinesis of the inferior posterior myocardium. Doppler parameters are consistent with high ventricular filling pressure. - Mitral valve: Calcified annulus. Mild regurgitation. - Left atrium: The atrium was mildly dilated. - Right ventricle: The cavity size was mildly dilated. - Right atrium: The atrium was mildly dilated. - Pulmonary arteries: Systolic pressure was mildly increased. PA peak pressure: 46mm Hg (S). Impressions:   08/20/2012 Dr. Lorine Bears from  [facility]    He was seen in January of this year rest pain involving the left leg with toe ulceration and early gangrenous changes. He underwent angiography which showed severe diffuse mid left SFA disease as  well as one-vessel runoff below the knee with 99% stenosis in the anterior tibial artery which was the only artery supplying the foot.  He underwent a staged endovascular intervention on the left lower extremity under general anesthesia due to inability to safely sedate him during his diagnostic angiography. The procedure was done via an antegrade access of the left femoral artery which was extremely difficult due to his obesity. I was able to perform balloon angioplasty on the left anterior tibial and tibioperoneal trunk. The left SFA was diffusely diseased and I placed 2 self-expanding stents.     HPI/Subjective: breathing better- c/o arm/shoulder pain  Objective: Filed Vitals:   11/29/12 0000 11/29/12 0400 11/29/12 0619 11/29/12 0727  BP: 128/99 136/47 140/53 130/35  Pulse: 72 58 67 60  Temp: 97.9 F (36.6 C) 97.7 F (36.5 C)  97.9 F (36.6 C)  TempSrc: Oral Oral  Oral  Resp: 19 19  18   Height:      Weight:      SpO2: 98% 98%  100%    Intake/Output Summary (Last 24  hours) at 11/29/12 1020 Last data filed at 11/29/12 0900  Gross per 24 hour  Intake    840 ml  Output   2750 ml  Net  -1910 ml   Filed Weights   11/26/12 0601 11/27/12 0515 11/28/12 0001  Weight: 130.137 kg (286 lb 14.4 oz) 129.774 kg (286 lb 1.6 oz) 130.3 kg (287 lb 4.2 oz)    Exam:   General: Alert,NAD  Cardiovascular: Regular rhythm and rate, negative murmurs rubs gallops, left PT/DP pulse nonpalpable  Respiratory: Clear to auscultation bilateral  Abdomen: Soft nontender nondistended plus bowel sounds  Musculoskeletal: Left foot appears to have dry gangrene in all 5 metatarsals,    Data Reviewed: Basic Metabolic Panel:  Recent Labs Lab 11/25/12 0525 11/26/12 0545 11/27/12 0500 11/28/12 0520 11/29/12 0545  NA 135 136 135 137 136  K 4.0 4.0 4.0 4.3 4.0  CL 100 102 102 102 99  CO2 20 24 22 23 25   GLUCOSE 125* 137* 88 78 159*  BUN 48* 41* 30* 22 20  CREATININE 1.72* 1.36* 1.12 1.03 1.18  CALCIUM 8.4 8.7 8.8 8.9 8.9  MG 2.3 2.4 2.3 2.3 2.0   Liver Function Tests:  Recent Labs Lab 11/25/12 0525 11/26/12 0545 11/27/12 0500 11/28/12 0520 11/29/12 0545  AST 165* 76* 45* 36 30  ALT 653* 478* 340* 239* 164*  ALKPHOS 291* 261* 226* 198* 174*  BILITOT 0.4 0.5 0.6 0.6 0.5  PROT 8.1 8.2 8.3 8.4* 8.1  ALBUMIN 2.0* 2.1* 2.1* 2.0* 2.0*   No results found for this basename: LIPASE, AMYLASE,  in the last 168 hours No results found for this basename: AMMONIA,  in the last 168 hours CBC:  Recent Labs Lab 11/25/12 0525 11/26/12 0545 11/27/12 0500 11/28/12 0520 11/29/12 0545  WBC 9.7 8.1 7.7 9.2 7.8  NEUTROABS 6.8 5.6 4.9 6.5 5.1  HGB 8.2* 8.0* 8.1* 8.6* 8.5*  HCT 25.6* 24.9* 25.4* 26.1* 25.8*  MCV 69.4* 69.2* 69.0* 69.2* 68.4*  PLT 194 212 206 254 257   Cardiac Enzymes:  Recent Labs Lab 11/22/12 1426 11/22/12 1844  TROPONINI 0.53* 0.41*   BNP (last 3 results)  Recent Labs  11/22/12 1426 11/23/12 1852  PROBNP 2633.0* 3413.0*   CBG:  Recent  Labs Lab 11/28/12 2049 11/29/12 0004 11/29/12 0440 11/29/12 0510 11/29/12 0832  GLUCAP 311* 231* 55* 93 175*    Recent Results (from the past 240  hour(s))  MRSA PCR SCREENING     Status: None   Collection Time    11/22/12  1:47 AM      Result Value Range Status   MRSA by PCR NEGATIVE  NEGATIVE Final   Comment:            The GeneXpert MRSA Assay (FDA     approved for NASAL specimens     only), is one component of a     comprehensive MRSA colonization     surveillance program. It is not     intended to diagnose MRSA     infection nor to guide or     monitor treatment for     MRSA infections.     Studies: Dg Elbow 2 Views Right  02-Dec-2012   *RADIOLOGY REPORT*  Clinical Data: 72 year old male with elbow pain.  RIGHT ELBOW - 2 VIEW  Comparison: None.  Findings: Portable seated AP and lateral views of the right elbow. No evidence of joint effusion. Bone mineralization is within normal limits.  Alignment is preserved.  Joint spaces are relatively preserved.  There is a chronic ossific fragment at the medial epicondyle.  No acute fracture identified.  IMPRESSION: No acute osseous abnormality identified about the right elbow.   Original Report Authenticated By: Erskine Speed, M.D.   Dg Shoulder Right Port  12/02/12   *RADIOLOGY REPORT*  Clinical Data: Right shoulder pain after fall.  PORTABLE RIGHT SHOULDER - 2+ VIEW  Comparison: None.  Findings: Moderate right AC joint osteoarthritis.  Axillary view could not be performed.  There is no fracture identified.  Scapular Y views are degraded by obliquity. If there is high clinical suspicion of a dislocation although it is considered unlikely based on these radiographs, either axillary view could be performed or CT could be obtained.  IMPRESSION: No gross acute osseous abnormality.  Moderate AC joint osteoarthritis.   Original Report Authenticated By: Andreas Newport, M.D.    Scheduled Meds: . amLODipine  10 mg Oral Daily  . aspirin EC  325 mg  Oral Daily  . famotidine  20 mg Oral Daily  . ferrous sulfate  325 mg Oral Q breakfast  . furosemide  80 mg Intravenous Q12H  . heparin  5,000 Units Subcutaneous Q8H  . insulin aspart  0-15 Units Subcutaneous TID WC  . insulin aspart  0-5 Units Subcutaneous QHS  . insulin glargine  50 Units Subcutaneous Daily  . metoprolol succinate  100 mg Oral Q breakfast  . OxyCODONE  10 mg Oral Q12H  . potassium chloride  10 mEq Oral Daily  . sodium chloride  3 mL Intravenous Q12H   Continuous Infusions:    Principal Problem:   CHF (congestive heart failure) Active Problems:   NSTEMI (non-ST elevated myocardial infarction)   Anemia   Atrial fibrillation   ARF (acute renal failure)   GI bleed   Nicotine dependence   Drug abuse, marijuana    Time spent: 35 minutes    Philip Strong  [facility] Pager [phone]. If 7PM-7AM, please contact night-coverage at [email or web address], password [facility] 2012/12/02, 10:20 AM  LOS: 8 days

## 2012-11-29 NOTE — Progress Notes (Signed)
Pt. complained of rt. Shoulder pain radiates to the elbow and lower arm, rt. arm warm with palpable radial pulse able to lift up arm with some slight resistance due to discomfort. Pt. decided to sit at the side of the bed, appears to be uncomfortable lying in the bed and stated wants to sleep sitting up. Encourage to use recliner but refused, emphasized to pt. about safety multiple times and offered several options  to sleep that will make him comfortable and even my nurse tech spoke to pt. Pt. refused everything and stated that he is going to be fine and he's not gonna fall  not to worry him. Pt. continue to be stubborn , monitored pt. frequently and K. KirbyNP was notified about the rt. arm pain and how the pt. sleep in sitting positon at the side of the bed. With orders made and will cont. to observe pt. as frequent as possible.

## 2012-11-29 NOTE — Progress Notes (Addendum)
Pt. Had episode of hypoglycemia blood sugar was 55  slightly symptomatic felt weak, given orange juice and crackers with peanut butter. Blood sugar rechecked up to 93. K. KirbyNP was paged text. Will cont. to monitor blood sugar.

## 2012-11-29 NOTE — Progress Notes (Signed)
Patient: Philip Strong / Admit Date: 11/21/2012 / Date of Encounter: 11/29/2012, 8:08 AM   Subjective  Reports that he is still SOB and not yet back to baseline, but getting better. Foot pain improving. Still with some LEE.   Objective   Telemetry: NSR Physical Exam: Filed Vitals:   11/29/12 0727  BP: 130/35  Pulse: 60  Temp: 97.9 F (36.6 C)  Resp: 18   General: Well developed, well nourished AAM in no acute distress. Head: Normocephalic, atraumatic, sclera non-icteric, no xanthomas, nares are without discharge. Neck: JVD not elevated. Lungs: Diminished BS throughout. Breathing is unlabored. Heart: RRR S1 S2 without murmurs, rubs, or gallops.  Abdomen: Soft, non-tender, non-distended with normoactive bowel sounds. No hepatomegaly. No rebound/guarding. No obvious abdominal masses. Msk:  Strength and tone appear normal for age. Extremities: No clubbing or cyanosis. 1+ edema LLE. R BKA Neuro: Alert and oriented X 3. Moves all extremities spontaneously. Psych:  Responds to questions appropriately with a normal affect.    Intake/Output Summary (Last 24 hours) at 11/29/12 1610 Last data filed at 11/29/12 0600  Gross per 24 hour  Intake    720 ml  Output   2920 ml  Net  -2200 ml    Inpatient Medications:  . amLODipine  10 mg Oral Daily  . aspirin EC  325 mg Oral Daily  . famotidine  20 mg Oral Daily  . ferrous sulfate  325 mg Oral Q breakfast  . furosemide  80 mg Intravenous Q12H  . heparin  5,000 Units Subcutaneous Q8H  . insulin aspart  0-20 Units Subcutaneous Q4H  . insulin glargine  50 Units Subcutaneous Daily  . metoprolol succinate  100 mg Oral Q breakfast  . OxyCODONE  10 mg Oral Q12H  . potassium chloride  10 mEq Oral Daily  . sodium chloride  3 mL Intravenous Q12H    Labs:  Recent Labs  11/28/12 0520 11/29/12 0545  NA 137 136  K 4.3 4.0  CL 102 99  CO2 23 25  GLUCOSE 78 159*  BUN 22 20  CREATININE 1.03 1.18  CALCIUM 8.9 8.9  MG 2.3 2.0    Recent  Labs  11/28/12 0520 11/29/12 0545  AST 36 30  ALT 239* 164*  ALKPHOS 198* 174*  BILITOT 0.6 0.5  PROT 8.4* 8.1  ALBUMIN 2.0* 2.0*    Recent Labs  11/28/12 0520 11/29/12 0545  WBC 9.2 7.8  NEUTROABS 6.5 5.1  HGB 8.6* 8.5*  HCT 26.1* 25.8*  MCV 69.2* 68.4*  PLT 254 257   No results found for this basename: CKTOTAL, CKMB, TROPONINI,  in the last 72 hours No components found with this basename: POCBNP,  No results found for this basename: HGBA1C,  in the last 72 hours   Radiology/Studies:  Dg Chest 2 View  11/23/2012   *RADIOLOGY REPORT*  Clinical Data: CHF.  CHEST - 2 VIEW  Comparison: 11/06/2012  Findings: Mild to moderate cardiomegaly.  Diffuse interstitial and alveolar opacities compatible with CHF.  Prior CABG.  No effusions. No acute bony abnormality.  IMPRESSION: Mild CHF.   Original Report Authenticated By: Charlett Nose, M.D.   Dg Chest 2 View  11/07/2012   *RADIOLOGY REPORT*  Clinical Data: Bodyaches and shortness of breath.  CHEST - 2 VIEW  Comparison: 11/11/2008.  Findings: Cardiomegaly, similar to prior.  Status post CABG.  No change in upper mediastinal contours.  Enlarged vascular structures centrally.   Coarsened interstitial markings.  No focal consolidation, edema, effusion, pneumothorax.  No significant osseous abnormality.  IMPRESSION:  Cardiomegaly and pulmonary venous congestion.   Original Report Authenticated By: Tiburcio Pea   Ct Head Wo Contrast  11/07/2012   *RADIOLOGY REPORT*  Clinical Data: Hyperglycemia; moderate headache.  CT HEAD WITHOUT CONTRAST  Technique:  Contiguous axial images were obtained from the base of the skull through the vertex without contrast.  Comparison: CT of the head performed 02/18/2004  Findings: There is no evidence of acute infarction, mass lesion, or intra- or extra-axial hemorrhage on CT.  The posterior fossa, including the cerebellum, brainstem and fourth ventricle, is within normal limits.  The third and lateral ventricles, and  basal ganglia are unremarkable in appearance.  The cerebral hemispheres are symmetric in appearance, with normal gray- white differentiation.  No mass effect or midline shift is seen.  There is no evidence of fracture; visualized osseous structures are unremarkable in appearance.  The orbits are within normal limits. Mucosal thickening is noted at the left maxillary sinus, sphenoid sinus and ethmoid air cells.  The remaining paranasal sinuses and mastoid air cells are well-aerated.  No significant soft tissue abnormalities are seen.  IMPRESSION:  1.  No acute intracranial pathology seen on CT. 2.  Mucosal thickening at the left maxillary sinus, sphenoid sinus and ethmoid air cells.   Original Report Authenticated By: Tonia Ghent, M.D.   Ct Angio Chest Pe W/cm &/or Wo Cm  11/21/2012   *RADIOLOGY REPORT*  Clinical Data:  Chest pain, back pain and shortness of breath.  CT ANGIOGRAPHY CHEST, ABDOMEN AND PELVIS  Technique:  Multidetector CT imaging through the chest, abdomen and pelvis was performed using the standard protocol during bolus administration of intravenous contrast.  Multiplanar reconstructed images including MIPs were obtained and reviewed to evaluate the vascular anatomy.  Contrast: 80mL OMNIPAQUE IOHEXOL 350 MG/ML SOLN  Comparison:   None.  CTA CHEST  Findings:  The patient has had prior CABG.  The heart is moderately enlarged.  There is evidence of florid pulmonary edema, especially in the upper lobes bilaterally.  Associated small right pleural effusion is present.  The thoracic aorta is of normal caliber and shows no evidence of aneurysmal disease or dissection.  Pulmonary arteries are also well opacified and are unremarkable in appearance.  There is no evidence of pulmonary embolism.  No pulmonary masses are seen.  There are a number of small to borderline lymph nodes identified throughout the mediastinum.  The largest lower right paratracheal lymph node near the carina measures approximately 1.8  cm in short axis.  No enlarged axillary lymph nodes are identified.  The bony thorax is unremarkable.   Review of the MIP images confirms the above findings.  IMPRESSION:  1.  Normal thoracic aorta.  No evidence of aneurysmal disease or aortic dissection. 2.  Florid pulmonary edema with small right pleural effusion. 3.  Borderline lymph nodes throughout the mediastinum.  These may be reactive.  Consider follow-up CT of the chest.  CTA ABDOMEN AND PELVIS  Findings:  The abdominal aorta is of normal caliber. Atherosclerosis is identified involving the distal aorta and numerous branch vessels.  There is no evidence of aortic dissection.  Moderate stenosis at the origin of the celiac axis approaches 50 - 60% in estimated caliber.  The superior mesenteric artery shows scattered plaque without significant stenosis.  Bilateral single renal arteries show proximal plaque without high-grade stenosis. The inferior mesenteric artery is open.  Diffuse atherosclerosis of the bilateral iliac arteries is identified without evidence of aneurysm.  Greatest degree of stenosis is at the level of the mid left external iliac artery showing roughly 50% narrowing.  Bilateral common femoral arteries show significant plaque.  There is visible proximal occlusion of the right superficial femoral artery.  Nonvascular evaluation on the arterial phase study shows an unremarkable appearance to the solid organs of the abdomen.  No abnormal fluid collections are seen.  No evidence of masses or enlarged lymph nodes.  Bowel is unremarkable.  No hernias are seen. Bladder is within normal limits.  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  Bony structures show degenerative changes of the lower lumbar spine with fusion of the L4 and L5 vertebral bodies.   Review of the MIP images confirms the above findings.  IMPRESSION:  1.  No evidence of aortic aneurysm or dissection. 2.  Atherosclerosis evident throughout the  abdomen and pelvis. There is roughly a 50% stenosis of the left external iliac artery. There is evidence of proximal occlusion of the right superficial femoral artery.  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   Original Report Authenticated By: Irish Lack, M.D.   Ct Cta Abd/pel W/cm &/or W/o Cm  11/21/2012   *RADIOLOGY REPORT*  Clinical Data:  Chest pain, back pain and shortness of breath.  CT ANGIOGRAPHY CHEST, ABDOMEN AND PELVIS  Technique:  Multidetector CT imaging through the chest, abdomen and pelvis was performed using the standard protocol during bolus administration of intravenous contrast.  Multiplanar reconstructed images including MIPs were obtained and reviewed to evaluate the vascular anatomy.  Contrast: 80mL OMNIPAQUE IOHEXOL 350 MG/ML SOLN  Comparison:   None.  CTA CHEST  Findings:  The patient has had prior CABG.  The heart is moderately enlarged.  There is evidence of florid pulmonary edema, especially in the upper lobes bilaterally.  Associated small right pleural effusion is present.  The thoracic aorta is of normal caliber and shows no evidence of aneurysmal disease or dissection.  Pulmonary arteries are also well opacified and are unremarkable in appearance.  There is no evidence of pulmonary embolism.  No pulmonary masses are seen.  There are a number of small to borderline lymph nodes identified throughout the mediastinum.  The largest lower right paratracheal lymph node near the carina measures approximately 1.8 cm in short axis.  No enlarged axillary lymph nodes are identified.  The bony thorax is unremarkable.   Review of the MIP images confirms the above findings.  IMPRESSION:  1.  Normal thoracic aorta.  No evidence of aneurysmal disease or aortic dissection. 2.  Florid pulmonary edema with small right pleural effusion. 3.  Borderline lymph nodes throughout the mediastinum.  These may be reactive.  Consider follow-up CT of the chest.  CTA ABDOMEN AND PELVIS  Findings:  The abdominal  aorta is of normal caliber. Atherosclerosis is identified involving the distal aorta and numerous branch vessels.  There is no evidence of aortic dissection.  Moderate stenosis at the origin of the celiac axis approaches 50 - 60% in estimated caliber.  The superior mesenteric artery shows scattered plaque without significant stenosis.  Bilateral single renal arteries show proximal plaque without high-grade stenosis. The inferior mesenteric artery is open.  Diffuse atherosclerosis of the bilateral iliac arteries is identified without evidence of aneurysm.  Greatest degree of stenosis is at the level of the mid left external iliac artery showing roughly 50% narrowing.  Bilateral common femoral arteries show significant plaque.  There is visible proximal occlusion of the right superficial  femoral artery.  Nonvascular evaluation on the arterial phase study shows an unremarkable appearance to the solid organs of the abdomen.  No abnormal fluid collections are seen.  No evidence of masses or enlarged lymph nodes.  Bowel is unremarkable.  No hernias are seen. Bladder is within normal limits.  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  Bony structures show degenerative changes of the lower lumbar spine with fusion of the L4 and L5 vertebral bodies.   Review of the MIP images confirms the above findings.  IMPRESSION:  1.  No evidence of aortic aneurysm or dissection. 2.  Atherosclerosis evident throughout the abdomen and pelvis. There is roughly a 50% stenosis of the left external iliac artery. There is evidence of proximal occlusion of the right superficial femoral artery.  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   Original Report Authenticated By: Irish Lack, M.D.     Assessment and Plan  1. PAD with rest foot pain and early dry gangrene: s/p PTA of in stent restenosis left SFA 11/27/12 per Dr. Kirke Corin. Will continue ASA only with heme positive stools.  2. CAD: pt is s/p CABG.  Troponin mildly elevated on admission and trending down at last check. Felt to be Type II NSTEMI in setting of anemia. Currently no chest pain. Continue medical therapy with risk stratification later if needed. Hold off on statin given elevated LFTs this admission.  3. Acute on chronic combined systolic and diastolic CHF (EF 45-40% by echo 11/22/12): His I/O negative 4L since admission with stable weight. Continue IV Lasix through today.   4. Atrial fibrillation: Appears to be new at time of this admission. Sinus this am. Not a good candidate for anticoagulation due to anemia with positive occult blood in stool. Could be considered for such in the future.  5. Severe microcytic anemia with +FOBT: rec'd 2 UPRBC. Appreciate IM assistance.  6. Marked transaminitis: Appears to be improving. ?etiology. Statin discontinued. Per IM.  7. Acute renal insufficiency: improving. 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  8. Borderline lymphadenopathy by CT chest: recommend that primary team have pt f/u PCP for this at discharge.  Signed, Ronie Spies PA-C  I have personally seen and examined this patient with Ronie Spies, PA-C. I agree with the assessment and plan as outlined above. Continue diuresis today. PAD stable post PTA.   MCALHANY,CHRISTOPHER 12:07 PM ,11/29/2012

## 2012-11-30 DIAGNOSIS — I5023 Acute on chronic systolic (congestive) heart failure: Secondary | ICD-10-CM

## 2012-11-30 LAB — COMPREHENSIVE METABOLIC PANEL
ALT: 133 U/L — ABNORMAL HIGH (ref 0–53)
AST: 33 U/L (ref 0–37)
BUN: 22 mg/dL (ref 6–23)
CO2: 26 mEq/L (ref 19–32)
Calcium: 9.1 mg/dL (ref 8.4–10.5)
Glucose, Bld: 194 mg/dL — ABNORMAL HIGH (ref 70–99)
Total Bilirubin: 0.4 mg/dL (ref 0.3–1.2)
Total Protein: 8.3 g/dL (ref 6.0–8.3)

## 2012-11-30 LAB — CBC WITH DIFFERENTIAL/PLATELET
Basophils Relative: 0 % (ref 0–1)
Eosinophils Relative: 2 % (ref 0–5)
Lymphs Abs: 1.9 10*3/uL (ref 0.7–4.0)
MCH: 22.3 pg — ABNORMAL LOW (ref 26.0–34.0)
MCV: 68.7 fL — ABNORMAL LOW (ref 78.0–100.0)
Monocytes Absolute: 0.8 10*3/uL (ref 0.1–1.0)
Platelets: 293 10*3/uL (ref 150–400)
RBC: 3.64 MIL/uL — ABNORMAL LOW (ref 4.22–5.81)

## 2012-11-30 LAB — GLUCOSE, CAPILLARY: Glucose-Capillary: 156 mg/dL — ABNORMAL HIGH (ref 70–99)

## 2012-11-30 MED ORDER — OXYCODONE HCL ER 10 MG PO T12A: 10.0000 mg | EXTENDED_RELEASE_TABLET | Freq: Two times a day (BID) | ORAL | Status: DC

## 2012-11-30 MED ORDER — INSULIN GLARGINE 100 UNIT/ML ~~LOC~~ SOLN: 50.0000 [IU] | Freq: Every day | SUBCUTANEOUS | Status: DC

## 2012-11-30 MED ORDER — HYDROCODONE-ACETAMINOPHEN 5-325 MG PO TABS: 1.0000 | ORAL_TABLET | Freq: Four times a day (QID) | ORAL | Status: DC | PRN

## 2012-11-30 MED ORDER — FUROSEMIDE 20 MG PO TABS
20.0000 mg | ORAL_TABLET | Freq: Every day | ORAL | Status: DC
  Administered 2012-11-30: 20 mg via ORAL
  Filled 2012-11-30: qty 1

## 2012-11-30 NOTE — Care Management Note (Signed)
Spoke with DME representative with [facility] regarding shower chair/stool. It is an out of pocket expense that the family/patient states they will try to find one cheaper. Therefore, shower stool not arranged upon discharge. SPoke with Rex Kras, nephew to confirm this. Gerre Scull HallRNCM [phone]

## 2012-11-30 NOTE — Progress Notes (Signed)
Subjective: Patient denies CP  Breathing is OK   Objective: Filed Vitals:   11/29/12 2302 11/29/12 2324 11/30/12 0334 11/30/12 0735  BP: 129/58 139/45 144/51 145/41  Pulse:  64 64 76  Temp:  98.4 F (36.9 C) 97.6 F (36.4 C) 98.2 F (36.8 C)  TempSrc:  Oral Oral Oral  Resp:  16 16 18   Height:      Weight:    281 lb 4.9 oz (127.6 kg)  SpO2:  100% 100% 100%   Weight change:   Intake/Output Summary (Last 24 hours) at 11/30/12 0808 Last data filed at 11/30/12 0253  Gross per 24 hour  Intake   1340 ml  Output   2100 ml  Net   -760 ml    General: Alert, awake, oriented x3, in no acute distress Neck:  JVP is normal Heart: Regular rate and rhythm, without murmurs, rubs, gallops.  Lungs: Clear to auscultation.  No rales or wheezes. Exemities: s/p R BKA No edema.   Neuro: Grossly intact, nonfocal.   Lab Results: Results for orders placed during the hospital encounter of 11/21/12 (from the past 24 hour(s))  GLUCOSE, CAPILLARY     Status: Abnormal   Collection Time    11/29/12  8:32 AM      Result Value Range   Glucose-Capillary 175 (*) 70 - 99 mg/dL   Comment 1 Notify RN    GLUCOSE, CAPILLARY     Status: Abnormal   Collection Time    11/29/12 12:34 PM      Result Value Range   Glucose-Capillary 228 (*) 70 - 99 mg/dL   Comment 1 Notify RN    GLUCOSE, CAPILLARY     Status: Abnormal   Collection Time    11/29/12  5:29 PM      Result Value Range   Glucose-Capillary 261 (*) 70 - 99 mg/dL   Comment 1 Notify RN    GLUCOSE, CAPILLARY     Status: Abnormal   Collection Time    11/29/12  9:56 PM      Result Value Range   Glucose-Capillary 298 (*) 70 - 99 mg/dL  COMPREHENSIVE METABOLIC PANEL     Status: Abnormal   Collection Time    11/30/12  4:15 AM      Result Value Range   Sodium 133 (*) 135 - 145 mEq/L   Potassium 4.3  3.5 - 5.1 mEq/L   Chloride 96  96 - 112 mEq/L   CO2 26  19 - 32 mEq/L   Glucose, Bld 194 (*) 70 - 99 mg/dL   BUN 22  6 - 23 mg/dL   Creatinine, Ser  4.09  0.50 - 1.35 mg/dL   Calcium 9.1  8.4 - 81.1 mg/dL   Total Protein 8.3  6.0 - 8.3 g/dL   Albumin 2.1 (*) 3.5 - 5.2 g/dL   AST 33  0 - 37 U/L   ALT 133 (*) 0 - 53 U/L   Alkaline Phosphatase 160 (*) 39 - 117 U/L   Total Bilirubin 0.4  0.3 - 1.2 mg/dL   GFR calc non Af Amer 67 (*) >90 mL/min   GFR calc Af Amer 77 (*) >90 mL/min  CBC WITH DIFFERENTIAL     Status: Abnormal   Collection Time    11/30/12  4:15 AM      Result Value Range   WBC 8.6  4.0 - 10.5 K/uL   RBC 3.64 (*) 4.22 - 5.81 MIL/uL   Hemoglobin 8.1 (*)  13.0 - 17.0 g/dL   HCT 62.9 (*) 52.8 - 41.3 %   MCV 68.7 (*) 78.0 - 100.0 fL   MCH 22.3 (*) 26.0 - 34.0 pg   MCHC 32.4  30.0 - 36.0 g/dL   RDW 24.4 (*) 01.0 - 27.2 %   Platelets 293  150 - 400 K/uL   Neutrophils Relative % 67  43 - 77 %   Lymphocytes Relative 22  12 - 46 %   Monocytes Relative 9  3 - 12 %   Eosinophils Relative 2  0 - 5 %   Basophils Relative 0  0 - 1 %   Neutro Abs 5.7  1.7 - 7.7 K/uL   Lymphs Abs 1.9  0.7 - 4.0 K/uL   Monocytes Absolute 0.8  0.1 - 1.0 K/uL   Eosinophils Absolute 0.2  0.0 - 0.7 K/uL   Basophils Absolute 0.0  0.0 - 0.1 K/uL   RBC Morphology TARGET CELLS    MAGNESIUM     Status: None   Collection Time    11/30/12  4:15 AM      Result Value Range   Magnesium 1.9  1.5 - 2.5 mg/dL    Studies/Results: @RISRSLT24 @  Medications:   @PROBHOSP @  1.  PA1. PAD with rest foot pain and early dry gangrene: s/p PTA of in stent restenosis left SFA 11/27/12 per Dr. Kirke Corin. Will continue ASA only with heme positive stools.  2. CAD: pt is s/p CABG. Troponin mildly elevated on admission and trending down at last check. Felt to be Type II NSTEMI in setting of anemia.  3. Acute on chronic combined systolic and diastolic CHF (EF 53-66% by echo 11/22/12):  DIuresed 4.6 L  Sats good on RA  4. Atrial fibrillation: Appears to be new at time of this admission.  Currently SR. Not a good candidate for anticoagulation due to anemia with positive occult  blood in stool. Could be considered for such in the future.  5. Severe microcytic anemia with +FOBT: rec'd 2 UPRBC. Hgb remains fairly stable Will need close f/u as outpatient 6. Marked transaminitis: COntinues tom improve  Felt secondary to statins  Will need to be followed \\as  outpatent and decision made for future Rx  ? Trial of another agent 7. Acute renal insufficiency  Cr improved.   8. Borderline lymphadenopathy by CT chest: Will need to f/u with primary MD 9  HTN  BP is adequate  Note that lisinopril has been held  Will net resume for now  F/U BP as outpatient  Add as tolerates.    OK to d/c today from cardiac standpoint.  WIll make sure he has close f/u as outpatinet  Our office will call.    LOS: 9 days   Dietrich Pates 11/30/2012, 8:08 AM

## 2012-11-30 NOTE — Care Management Note (Signed)
   CARE MANAGEMENT NOTE 11/30/2012  Patient:  Philip Strong, Philip Strong   Account Number:  [id number]  Date Initiated:  11/25/2012  Documentation initiated by:  [facility]  Subjective/Objective Assessment:   72 yo male Chief Complaint: Shortness of breath and chest pain.//home with spouse     Action/Plan:   Diurese//home with Home Health   Anticipated DC Date:  11/25/2012   Anticipated DC Plan:  HOME W HOME HEALTH SERVICES      DC Planning Services  CM consult      [facility] Choice  HOME HEALTH  DURABLE MEDICAL EQUIPMENT   Choice offered to / List presented to:  C-4 Adult Children   DME arranged  SHOWER STOOL      DME agency  [facility]     [facility] arranged  HH-1 RN  HH-10 DISEASE MANAGEMENT  HH-2 PT      HH agency  [facility]   Status of service:  Completed, signed off Medicare Important Message given?   (If response is "NO", the following Medicare IM given date fields will be blank) Date Medicare IM given:   Date Additional Medicare IM given:    Discharge Disposition:  HOME W HOME HEALTH SERVICES  Per UR Regulation:  Reviewed for med. necessity/level of care/duration of stay  If discussed at Long Length of Stay Meetings, dates discussed:    Comments:  11/30/12 Philip Strong [phone] Received call from patient's nurse. [name] will need [facility] PT/RN services and shower stool. Called and spoke with nephew, Philip Strong who states he and his family help care for patient for MD appointments and etc. Emphasized to nephew that patient will need close follow up with his Cardiologist. It appears the Cardiologist office to call with appointment per notes. Called [facility] to make aware of referral for PT/RN services and that patient needs to be followed in their HF program. Philip Strong [phone]

## 2012-12-04 NOTE — Plan of Care (Cosign Needed)
Call placed to patient's insurance company regarding preauthorization for newly ordered OxyContin. Patient home telephone number updated with insurance company. Reference number is [id number] in regards to updating status for this claim.  Junious Silk, ANP

## 2012-12-09 ENCOUNTER — Encounter (INDEPENDENT_AMBULATORY_CARE_PROVIDER_SITE_OTHER): Payer: Medicare Other | Admitting: Cardiology

## 2012-12-09 DIAGNOSIS — I739 Peripheral vascular disease, unspecified: Secondary | ICD-10-CM

## 2012-12-10 ENCOUNTER — Ambulatory Visit: Payer: Medicare Other | Admitting: Cardiovascular Disease

## 2012-12-10 NOTE — Progress Notes (Signed)
Erroneous encounter

## 2012-12-10 NOTE — Progress Notes (Signed)
This encounter was created in error - please disregard.

## 2012-12-17 ENCOUNTER — Ambulatory Visit (INDEPENDENT_AMBULATORY_CARE_PROVIDER_SITE_OTHER): Payer: Medicare Other | Admitting: Cardiovascular Disease

## 2012-12-17 ENCOUNTER — Encounter: Payer: Self-pay | Admitting: Cardiovascular Disease

## 2012-12-17 VITALS — BP 118/50 | HR 55 | Wt 273.0 lb

## 2012-12-17 DIAGNOSIS — I2510 Atherosclerotic heart disease of native coronary artery without angina pectoris: Secondary | ICD-10-CM

## 2012-12-17 DIAGNOSIS — I509 Heart failure, unspecified: Secondary | ICD-10-CM

## 2012-12-17 DIAGNOSIS — I739 Peripheral vascular disease, unspecified: Secondary | ICD-10-CM

## 2012-12-17 MED ORDER — HYDROCODONE-ACETAMINOPHEN 5-325 MG PO TABS: 1.0000 | ORAL_TABLET | Freq: Four times a day (QID) | ORAL | Status: DC | PRN

## 2012-12-17 NOTE — Assessment & Plan Note (Signed)
Significant improvement in rest pain on the left side post left SFA angioplasty for instent restenosis. He has 1 vessel runoff with a patent anterior tibial artery. ABI improved to 0.75. Continue medical therapy. He is not on Plavix due to recent GI bleeding requiring transfusion. He continues to have discomfort involving the toes and needing pain medications. His pain threshold is very low from my previous experience. I agreed to give him refill on his pain medication.

## 2012-12-17 NOTE — Patient Instructions (Addendum)
Your physician recommends that you schedule a follow-up appointment in: 3 months.  

## 2012-12-17 NOTE — Progress Notes (Signed)
HPI  This is a 72 year old African American male is here today for a followup visit regarding peripheral arterial disease.  He has known history of insulin-dependent diabetes mellitus, hypertension, and tobacco use. He has a history of coronary disease with remote myocardial infarction. He underwent coronary bypass surgery apparently in 1999.  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 He later developed an ulceration of the stump which has been slow to heal. Angiogram at that time showed occluded right SFA.  He was seen  in January of 2014 with rest pain involving the left leg with toe ulceration and early gangrenous changes. He underwent angiography which showed severe diffuse mid left SFA disease as well as one-vessel runoff below the knee with 99% stenosis in the anterior tibial artery which was the only artery supplying the foot.  He underwent a staged endovascular intervention on the left lower extremity under general anesthesia due to inability to safely sedate him during his diagnostic angiography. The procedure was done via an antegrade access of the left femoral artery which was extremely difficult due to his obesity. I was able to perform balloon angioplasty on the left anterior tibial and tibioperoneal trunk. The left SFA was diffusely diseased and I placed 2 self-expanding stents. He was hospitalized recently at [facility] with acute on chronic heart failure, anemia with heme-positive stool requiring transfusion, acute renal failure, breast pain affecting his left foot. His ABI dropped to 0.6 and was likely underestimating the severity of his disease given his significant discomfort requiring narcotics around-the-clock. I proceeded with angiography which showed significant restenosis in the left SFA stent with patent anterior tibial artery. I performed balloon angioplasty of the left SFA with significant improvement. The procedure was very difficult due to access issues as well as  difficulty sedating him. His pain improved. He continues to have some discomfort involving the big toe and the third toe but significantly improved from before. His dyspnea has improved also.  No Known Allergies   Current Outpatient Prescriptions on File Prior to Visit  Medication Sig Dispense Refill  . acetaminophen (TYLENOL) 325 MG tablet Take 325 mg by mouth daily as needed for pain. For pain      . amLODipine (NORVASC) 10 MG tablet Take 10 mg by mouth daily.      Marland Kitchen aspirin 325 MG tablet Take 325 mg by mouth daily.      . ferrous sulfate 325 (65 FE) MG tablet Take 325 mg by mouth daily with breakfast.      . furosemide (LASIX) 20 MG tablet Take 20 mg by mouth every morning.       . insulin glargine (LANTUS) 100 UNIT/ML injection Inject 0.5 mLs (50 Units total) into the skin daily.  10 mL  12  . metoprolol succinate (TOPROL-XL) 100 MG 24 hr tablet Take 100 mg by mouth daily. Take with or immediately following a meal.      . OxyCODONE (OXYCONTIN) 10 mg T12A 12 hr tablet Take 1 tablet (10 mg total) by mouth every 12 (twelve) hours.  60 tablet  0  . potassium chloride (K-DUR) 10 MEQ tablet Take 10 mEq by mouth daily.      . ranitidine (ZANTAC) 150 MG tablet Take 150 mg by mouth daily.       No current facility-administered medications on file prior to visit.     Past Medical History  Diagnosis Date  . Diabetes mellitus, insulin dependent (IDDM), uncontrolled   .  Hypertension   . Coronary artery disease     a. Remote MI. b. CABG 1999.  Marland Kitchen Myocardial infarct   . 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 PAD (peripheral artery disease) 05/01/2012    a. 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 b. s/p extensive LLE angioplasty and stent placement x 2 to the SFA and angioplasty of anterior tibial and tibioperoneal trunck in 05/2012.  . Ischemic toe   . Hyperlipidemia   . Prostate ca     s/p radioactive seed implant  . CHF (congestive heart failure)   . Atrial  fibrillation     a. Documented 11/2012.  . Transaminitis     a. Documented 11/2012.     Past Surgical History  Procedure Laterality Date  . Leg amputation below knee      right  . Coronary artery bypass graft    . Right patella removal    . Right finger fracture    . Closed reduction clavicle fracture    . Angioplasty illiac artery  06/05/2012     Family History  Problem Relation Age of Onset  . Heart attack Mother      History   Social History  . Marital Status: Divorced    Spouse Name: N/A    Number of Children: N/A  . Years of Education: N/A   Occupational History  . Not on file.   Social History Main Topics  . Smoking status: Current Every Day Smoker -- 0.50 packs/day for 15 years  . Smokeless tobacco: Not on file  . Alcohol Use: Yes     Comment: occ  . Drug Use: Yes    Special: Marijuana  . Sexual Activity: Not on file   Other Topics Concern  . Not on file   Social History Narrative  . No narrative on file      PHYSICAL EXAM   BP 118/50  Pulse 55  Wt 273 lb (123.832 kg)  BMI 34.12 kg/m2  SpO2 99% Constitutional: He is oriented to person, place, and time. He appears well-developed and well-nourished. No distress.  HENT: No nasal discharge.  Head: Normocephalic and atraumatic.  Eyes: Pupils are equal and round. Right eye exhibits no discharge. Left eye exhibits no discharge.  Neck: Normal range of motion. Neck supple. No JVD present. No thyromegaly present.  Cardiovascular: Normal rate, regular rhythm, normal heart sounds and. Exam reveals no gallop and no friction rub. No murmur heard.  Pulmonary/Chest: Effort normal and breath sounds normal. No stridor. No respiratory distress. He has no wheezes. He has no rales. He exhibits no tenderness.  Abdominal: Soft. Bowel sounds are normal. He exhibits no distension. There is no tenderness. There is no rebound and no guarding.  Musculoskeletal: Normal range of motion. He exhibits no edema and no  tenderness.  Neurological: He is alert and oriented to person, place, and time. Coordination normal.  Skin: Skin is warm and dry. No rash noted. He is not diaphoretic. No erythema. No pallor.  Psychiatric: He has a normal mood and affect. His behavior is normal. Judgment and thought content normal.  Vascular: Femoral pulses are slightly diminished. Left dorsalis pedis pulse is nonpalpable. Left foot is warm. No ulceration.  ASSESSMENT AND PLAN

## 2012-12-17 NOTE — Assessment & Plan Note (Signed)
Recent ejection fraction was 45-50%. He appears to be euvolemic on current dose of Lasix. He was not discharged home on an ACE inhibitor due to a recent renal failure. This can be considered.

## 2012-12-17 NOTE — Assessment & Plan Note (Addendum)
He denies chest pain at this time. Dyspnea improved. Continue medical therapy for now. I will consider this stratification with stress testing in the near future.

## 2012-12-25 ENCOUNTER — Encounter (HOSPITAL_COMMUNITY): Payer: Self-pay

## 2012-12-25 ENCOUNTER — Ambulatory Visit (HOSPITAL_COMMUNITY): Admit: 2012-12-25 | Payer: Medicare Other | Admitting: Cardiovascular Disease

## 2012-12-25 SURGERY — ABDOMINAL AORTAGRAM
Anesthesia: LOCAL

## 2013-01-28 ENCOUNTER — Emergency Department (HOSPITAL_COMMUNITY): Payer: Medicare Other

## 2013-01-28 ENCOUNTER — Emergency Department (HOSPITAL_COMMUNITY)
Admission: EM | Admit: 2013-01-28 | Discharge: 2013-01-28 | Disposition: A | Discharge status: Home / Self Care | Payer: Medicare Other | Attending: Emergency Medicine | Admitting: Emergency Medicine

## 2013-01-28 ENCOUNTER — Encounter (HOSPITAL_COMMUNITY): Payer: Self-pay | Admitting: Emergency Medicine

## 2013-01-28 DIAGNOSIS — Z794 Long term (current) use of insulin: Secondary | ICD-10-CM | POA: Insufficient documentation

## 2013-01-28 DIAGNOSIS — Z951 Presence of aortocoronary bypass graft: Secondary | ICD-10-CM | POA: Insufficient documentation

## 2013-01-28 DIAGNOSIS — I10 Essential (primary) hypertension: Secondary | ICD-10-CM | POA: Insufficient documentation

## 2013-01-28 DIAGNOSIS — Z7982 Long term (current) use of aspirin: Secondary | ICD-10-CM | POA: Insufficient documentation

## 2013-01-28 DIAGNOSIS — I509 Heart failure, unspecified: Secondary | ICD-10-CM | POA: Insufficient documentation

## 2013-01-28 DIAGNOSIS — I252 Old myocardial infarction: Secondary | ICD-10-CM | POA: Insufficient documentation

## 2013-01-28 DIAGNOSIS — E1065 Type 1 diabetes mellitus with hyperglycemia: Secondary | ICD-10-CM | POA: Insufficient documentation

## 2013-01-28 DIAGNOSIS — IMO0002 Reserved for concepts with insufficient information to code with codable children: Secondary | ICD-10-CM | POA: Insufficient documentation

## 2013-01-28 DIAGNOSIS — I4891 Unspecified atrial fibrillation: Secondary | ICD-10-CM | POA: Insufficient documentation

## 2013-01-28 DIAGNOSIS — I2510 Atherosclerotic heart disease of native coronary artery without angina pectoris: Secondary | ICD-10-CM | POA: Insufficient documentation

## 2013-01-28 DIAGNOSIS — M25521 Pain in right elbow: Secondary | ICD-10-CM

## 2013-01-28 DIAGNOSIS — Z8546 Personal history of malignant neoplasm of prostate: Secondary | ICD-10-CM | POA: Insufficient documentation

## 2013-01-28 DIAGNOSIS — F17200 Nicotine dependence, unspecified, uncomplicated: Secondary | ICD-10-CM | POA: Insufficient documentation

## 2013-01-28 DIAGNOSIS — M25529 Pain in unspecified elbow: Secondary | ICD-10-CM | POA: Insufficient documentation

## 2013-01-28 DIAGNOSIS — Z79899 Other long term (current) drug therapy: Secondary | ICD-10-CM | POA: Insufficient documentation

## 2013-01-28 DIAGNOSIS — H538 Other visual disturbances: Secondary | ICD-10-CM | POA: Insufficient documentation

## 2013-01-28 DIAGNOSIS — R51 Headache: Secondary | ICD-10-CM | POA: Insufficient documentation

## 2013-01-28 DIAGNOSIS — R739 Hyperglycemia, unspecified: Secondary | ICD-10-CM

## 2013-01-28 DIAGNOSIS — H9209 Otalgia, unspecified ear: Secondary | ICD-10-CM | POA: Insufficient documentation

## 2013-01-28 LAB — BASIC METABOLIC PANEL
BUN: 11 mg/dL (ref 6–23)
CO2: 22 mEq/L (ref 19–32)
Calcium: 9 mg/dL (ref 8.4–10.5)
Chloride: 98 mEq/L (ref 96–112)
Creatinine, Ser: 0.78 mg/dL (ref 0.50–1.35)
GFR calc non Af Amer: 88 mL/min — ABNORMAL LOW (ref >90)

## 2013-01-28 LAB — GLUCOSE, CAPILLARY: Glucose-Capillary: 333 mg/dL — ABNORMAL HIGH (ref 70–99)

## 2013-01-28 MED ORDER — OXYCODONE-ACETAMINOPHEN 5-325 MG PO TABS
1.0000 | ORAL_TABLET | Freq: Once | ORAL | Status: AC
  Administered 2013-01-28: 1 via ORAL
  Filled 2013-01-28: qty 1

## 2013-01-28 MED ORDER — SODIUM CHLORIDE 0.9 % IV BOLUS (SEPSIS)
500.0000 mL | Freq: Once | INTRAVENOUS | Status: DC
  Administered 2013-01-28: 500 mL via INTRAVENOUS

## 2013-01-28 MED ORDER — IBUPROFEN 600 MG PO TABS: 600.0000 mg | ORAL_TABLET | Freq: Four times a day (QID) | ORAL | Status: DC | PRN

## 2013-01-28 MED ORDER — SODIUM CHLORIDE 0.9 % IV SOLN: Freq: Once | INTRAVENOUS | Status: DC

## 2013-01-28 MED ORDER — ACETAMINOPHEN 325 MG PO TABS: 650.0000 mg | ORAL_TABLET | ORAL | Status: DC | PRN

## 2013-01-28 NOTE — ED Notes (Signed)
Pt c/o severe leg pain -- in stump of amputation- 8/10, right elbow pain - pt states that he fell 7 months ago-- elbow swollen, states that pain is an 8/10. Headache is 7/10. Tears in eyes- informed doctor.

## 2013-01-28 NOTE — ED Provider Notes (Signed)
CSN: [id number]     Arrival date & time 01/28/13  1250 History   First MD Initiated Contact with Patient 01/28/13 1332     Chief Complaint  Patient presents with  . Hyperglycemia   (Consider location/radiation/quality/duration/timing/severity/associated sxs/prior Treatment) Patient is a 72 y.o. male presenting with headaches. The history is provided by the patient.  Headache Pain location:  L temporal Quality: throbbing. Radiates to: left ear and jaw. Onset quality:  Gradual Duration:  1 week Timing:  Constant Progression:  Unchanged Chronicity:  New Relieved by:  Nothing Worsened by:  Nothing tried Ineffective treatments:  Acetaminophen Associated symptoms: blurred vision and ear pain (left)   Associated symptoms: no abdominal pain, no dizziness, no drainage, no fever, no focal weakness, no hearing loss, no loss of balance, no nausea, no neck pain, no numbness, no paresthesias, no photophobia, no tingling and no vomiting    Patient is a 73 yo male with a past history of DM, HTN, CAD, PAD, HLD who presents with a headache of one weeks duration. Started on the left side of his head as a throbbing pain. Notes also has had left ear pain as well. Has not gotten worse or better. Tried tylenol and this did not help. He was seen in his PCPs office today and they asked that he be transported to the ED for evaluation of his headache. He denies dizziness, tinnitus, syncope, photophobia, and phonophobia.  Notably patients cbg was noted to be 380 in the PCPs office and per the patient 426 when EMS took his cbg. He states he is not taking his insulin as he is supposed to. Notes did not take his insulin today.  Past Medical History  Diagnosis Date  . Diabetes mellitus, insulin dependent (IDDM), uncontrolled   . Hypertension   . Coronary artery disease     a. Remote MI. b. CABG 1999.  Marland Kitchen Myocardial infarct   . 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 PAD  (peripheral artery disease) 05/01/2012    a. 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 b. s/p extensive LLE angioplasty and stent placement x 2 to the SFA and angioplasty of anterior tibial and tibioperoneal trunck in 05/2012.  . Ischemic toe   . Hyperlipidemia   . Prostate ca     s/p radioactive seed implant  . CHF (congestive heart failure)   . Atrial fibrillation     a. Documented 11/2012.  . Transaminitis     a. Documented 11/2012.   Past Surgical History  Procedure Laterality Date  . Leg amputation below knee      right  . Coronary artery bypass graft    . Right patella removal    . Right finger fracture    . Closed reduction clavicle fracture    . Angioplasty illiac artery  06/05/2012   Family History  Problem Relation Age of Onset  . Heart attack Mother    History  Substance Use Topics  . Smoking status: Current Every Day Smoker -- 0.50 packs/day for 15 years  . Smokeless tobacco: Not on file  . Alcohol Use: Yes     Comment: occ    Review of Systems  Constitutional: Negative for fever.  HENT: Positive for ear pain (left). Negative for hearing loss, neck pain and postnasal drip.   Eyes: Positive for blurred vision. Negative for photophobia.  Gastrointestinal: Negative for nausea, vomiting and abdominal pain.  Neurological: Positive for headaches. Negative for  dizziness, focal weakness, numbness, paresthesias and loss of balance.    Allergies  Review of patient's allergies indicates no known allergies.  Home Medications   Current Outpatient Rx  Name  Route  Sig  Dispense  Refill  . acetaminophen (TYLENOL) 325 MG tablet   Oral   Take 325 mg by mouth daily as needed for pain. For pain         . amLODipine (NORVASC) 10 MG tablet   Oral   Take 10 mg by mouth daily.         Marland Kitchen aspirin 325 MG tablet   Oral   Take 325 mg by mouth daily.         . ferrous sulfate 325 (65 FE) MG tablet   Oral   Take 325 mg by mouth daily with breakfast.         . furosemide  (LASIX) 20 MG tablet   Oral   Take 20 mg by mouth every morning.          Marland Kitchen HYDROcodone-acetaminophen (NORCO/VICODIN) 5-325 MG per tablet   Oral   Take 1 tablet by mouth every 6 (six) hours as needed.   30 tablet   0   . insulin glargine (LANTUS) 100 UNIT/ML injection   Subcutaneous   Inject 75 Units into the skin daily.         . metoprolol succinate (TOPROL-XL) 100 MG 24 hr tablet   Oral   Take 100 mg by mouth daily. Take with or immediately following a meal.         . potassium chloride (K-DUR) 10 MEQ tablet   Oral   Take 10 mEq by mouth daily.         . ranitidine (ZANTAC) 150 MG tablet   Oral   Take 150 mg by mouth daily.          SpO2 100% Physical Exam  Constitutional: He appears well-developed and well-nourished.  HENT:  Head: Normocephalic and atraumatic.  Right Ear: External ear normal.  Left Ear: External ear normal.  Mouth/Throat: Oropharynx is clear and moist.  Bilateral TMs normal  Eyes: Conjunctivae are normal. Pupils are equal, round, and reactive to light.  Neck: Neck supple.  Cardiovascular: Normal rate, regular rhythm and normal heart sounds.   Pulmonary/Chest: Effort normal and breath sounds normal.  Abdominal: Soft. He exhibits no distension. There is no tenderness.  Musculoskeletal: He exhibits no edema.  Neurological: He is alert.  5/5 strength in bilateral upper and lower extremities, sensation to light touch intact in bilateral upper and lower extremities, visual fields intact, CN 3-12 intact, patellar reflexes intact  Skin: Skin is warm and dry.    ED Course  Procedures (including critical care time) Labs Review Labs Reviewed  GLUCOSE, CAPILLARY - Abnormal; Notable for the following:    Glucose-Capillary 333 (*)    All other components within normal limits  BASIC METABOLIC PANEL - Abnormal; Notable for the following:    Sodium 132 (*)    Glucose, Bld 370 (*)    GFR calc non Af Amer 88 (*)    All other components within  normal limits   Imaging Review Dg Elbow 2 Views Right  01/28/2013   CLINICAL DATA:  Right elbow pain history of previous injury 3 months ago  EXAM: RIGHT ELBOW - 2 VIEW  COMPARISON:  11/29/2012  FINDINGS: Chronic fragment is noted along the medial epicondyle. It is stable in appearance. No acute fracture or dislocation is noted.  No soft tissue changes are seen.  IMPRESSION: No acute abnormality noted.   Electronically Signed   By: Alcide Clever M.D.   On: 01/28/2013 15:08   Ct Head Wo Contrast  01/28/2013   CLINICAL DATA:  Headache for 1 week, worse today with blurred vision.  EXAM: CT HEAD WITHOUT CONTRAST  TECHNIQUE: Contiguous axial images were obtained from the base of the skull through the vertex without intravenous contrast.  COMPARISON:  11/07/2012  FINDINGS: The ventricles are normal in size, for this patient's age, and normal in configuration.  There are no parenchymal masses or mass effect. Subtle hypoattenuation is suggested along the posterior inferior right cerebellum. This may be artifact. It is also stable when compared to the prior exam. There is no evidence of a recent cortical infarct.  There are no extra-axial masses or abnormal fluid collections.  There is no intracranial hemorrhage.  The sinuses and right mastoid air cells are clear. Several of the posterior inferior left mastoid air cells show fluid attenuation.  IMPRESSION: 1. No acute intracranial abnormalities. 2. Age related volume loss. 3. Several of the left mastoid air cells show fluid attenuation likely a mild chronic effusion.   Electronically Signed   By: Amie Portland M.D.   On: 01/28/2013 15:30    MDM   1. Headache   2. Hyperglycemia   3. Elbow pain, right    1:30 pm: patient seen and examined. Presented via EMS from PCP office with headache and hyperglycemia. Notes headache is left sided and feels like a throbbing. Appears to be a tension headache, though will get CT head w/o contrast to rule out bleed. Additionally  with cbg of 333. Patient has not received his insulin today. Will give NS @ 125 mL/hr.   2:25 pm: patient now complaining of right elbow pain. 8/10. Not improved with tylenol. Has been bothering him since he was in the ED 11/29/12. They saw no acute abnormality on this film. Will give one dose of percocet at this time and get an x-ray of his elbow.  2:53 pm: BMET returned. Glucose to 370. No anion gap.   3:37 pm: patients CT head revealed no acute abnormalities. Discussed with patient and understands this.   Headache most consistent with tension headache. CT scan negative for acute intracranial process. Advised patient to use ibuprofen for headache and this will additionally help with elbow pain. Blood glucose to the 300's without anion gap on BMET most likely related to patient not taking insulin today. Asked that patient discuss insulin regimen with his PCP as he states that he is supposed to be on novolog 70 units in the morning. Patient stated understanding of this and states will call his PCP tomorrow morning. Patient stable for discharge with follow-up with his PCP.  Marikay Alar, MD [facility] PGY-2 01/28/13 3:44 pm  Glori Luis, MD 01/28/13 1600

## 2013-01-28 NOTE — ED Provider Notes (Signed)
I saw and evaluated the patient, reviewed the resident's note and I agree with the findings and plan.  Pt sent to the ED by his PCP for headache and head ct.  No fevers.  Nl neuro exam.  Doubt stroke, hemorrhage.  Head ct normal.  Will dc home with pain medications.   Hyperglycemia noted.  Pt given fluids.  Discussed follow up with his primary doctor regarding his diabetes.    Celene Kras, MD 01/28/13 [phone]

## 2013-01-28 NOTE — ED Notes (Signed)
Pt went to dr today for c/o of h/a x 1 week worse today also had blurred vision was found at DR office to have cbg of 380 and was given nph 12 units per ems cbg was 426. Dr was concered for bleed per ems pt has no slurred speech but did have pain in left jaw that rads to left side of head

## 2013-01-28 NOTE — ED Notes (Signed)
Dr at bedside.

## 2013-02-03 ENCOUNTER — Other Ambulatory Visit: Payer: Self-pay | Admitting: Cardiovascular Disease

## 2013-02-10 ENCOUNTER — Other Ambulatory Visit: Payer: Self-pay | Admitting: Cardiovascular Disease

## 2013-02-10 ENCOUNTER — Other Ambulatory Visit: Payer: Self-pay | Admitting: *Deleted

## 2013-02-10 MED ORDER — CLOPIDOGREL BISULFATE 75 MG PO TABS: ORAL_TABLET | ORAL | Status: DC

## 2013-02-10 NOTE — Telephone Encounter (Signed)
Requested Prescriptions   Signed Prescriptions Disp Refills  . clopidogrel (PLAVIX) 75 MG tablet 90 tablet 3    Sig: TAKE 1 TABLET (75 MG TOTAL) BY MOUTH DAILY.    Authorizing Provider: Lorine Bears A    Ordering User: Kendrick Fries

## 2013-02-24 ENCOUNTER — Other Ambulatory Visit: Payer: Self-pay | Admitting: Family Medicine

## 2013-03-04 ENCOUNTER — Ambulatory Visit (INDEPENDENT_AMBULATORY_CARE_PROVIDER_SITE_OTHER): Payer: Medicare Other | Admitting: Cardiovascular Disease

## 2013-03-04 ENCOUNTER — Encounter: Payer: Self-pay | Admitting: Cardiovascular Disease

## 2013-03-04 ENCOUNTER — Encounter: Payer: Self-pay | Admitting: *Deleted

## 2013-03-04 VITALS — BP 160/80 | HR 78 | Ht 75.0 in | Wt 275.0 lb

## 2013-03-04 DIAGNOSIS — I739 Peripheral vascular disease, unspecified: Secondary | ICD-10-CM

## 2013-03-04 DIAGNOSIS — R0609 Other forms of dyspnea: Secondary | ICD-10-CM

## 2013-03-04 DIAGNOSIS — I5022 Chronic systolic (congestive) heart failure: Secondary | ICD-10-CM

## 2013-03-04 DIAGNOSIS — I2510 Atherosclerotic heart disease of native coronary artery without angina pectoris: Secondary | ICD-10-CM

## 2013-03-04 MED ORDER — ASPIRIN 81 MG PO TABS: 81.0000 mg | ORAL_TABLET | Freq: Every day | ORAL | Status: AC

## 2013-03-04 NOTE — Patient Instructions (Signed)
Your physician has requested that you have a lexiscan myoview.  Please follow instruction sheet, as given.  Your physician recommends that you schedule a follow-up appointment in: 3 months with Dr Kirke Corin  Your physician has recommended you make the following change in your medication:  DECREASE ASPIRIN TO 81 MG

## 2013-03-04 NOTE — Assessment & Plan Note (Signed)
He continues to have significant exertional dyspnea. He does not seem to be fluid overloaded. I recommend evaluation with a pharmacologic nuclear stress test given his known history of coronary artery disease and no recent ischemic evaluation. He is not able to exercise on a treadmill due to right below the knee amputation.

## 2013-03-04 NOTE — Progress Notes (Signed)
HPI  This is a 72 year old African American male is here today for a followup visit regarding peripheral arterial disease.  He has known history of insulin-dependent diabetes mellitus, hypertension, and tobacco use. He has a history of coronary disease with remote myocardial infarction. He underwent coronary bypass surgery apparently in 1999.  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 He was seen  in January of 2014 with rest pain involving the left leg with toe ulceration and early gangrenous changes. He underwent angiography which showed severe diffuse mid left SFA disease as well as one-vessel runoff below the knee with 99% stenosis in the anterior tibial artery which was the only artery supplying the foot.  He underwent a staged endovascular intervention on the left lower extremity under general anesthesia due to inability to safely sedate him during his diagnostic angiography. The procedure was done via an antegrade access of the left femoral artery which was extremely difficult due to his obesity. I was able to perform balloon angioplasty on the left anterior tibial and tibioperoneal trunk. The left SFA was diffusely diseased and I placed 2 self-expanding stents. He was hospitalized at [facility] in August with acute on chronic heart failure, anemia with heme-positive stool requiring transfusion, acute renal failure, rest pain affecting his left foot. His ABI dropped to 0.6 and was likely underestimating the severity of his disease given his significant discomfort requiring narcotics around-the-clock. I proceeded with angiography which showed significant restenosis in the left SFA stent with patent anterior tibial artery. I performed balloon angioplasty of the left SFA with significant improvement. The procedure was very difficult due to access issues as well as difficulty sedating him.  He denies any pain affecting the left leg. He continues to have significant exertional dyspnea. No  orthopnea or PND. He is tolerating aspirin and Plavix.  No Known Allergies   Current Outpatient Prescriptions on File Prior to Visit  Medication Sig Dispense Refill  . acetaminophen (TYLENOL) 325 MG tablet Take 325 mg by mouth daily as needed for pain. For pain      . amLODipine (NORVASC) 10 MG tablet Take 10 mg by mouth daily.      Marland Kitchen aspirin 325 MG tablet Take 325 mg by mouth daily.      . clopidogrel (PLAVIX) 75 MG tablet TAKE 1 TABLET (75 MG TOTAL) BY MOUTH DAILY.  90 tablet  3  . ferrous sulfate 325 (65 FE) MG tablet Take 325 mg by mouth daily with breakfast.      . furosemide (LASIX) 20 MG tablet Take 20 mg by mouth every morning.       Marland Kitchen ibuprofen (ADVIL,MOTRIN) 600 MG tablet Take 1 tablet (600 mg total) by mouth every 6 (six) hours as needed for pain. Take with food  30 tablet  0  . metoprolol succinate (TOPROL-XL) 100 MG 24 hr tablet Take 100 mg by mouth daily. Take with or immediately following a meal.      . potassium chloride (K-DUR) 10 MEQ tablet Take 10 mEq by mouth daily.      . ranitidine (ZANTAC) 150 MG tablet Take 150 mg by mouth daily.       No current facility-administered medications on file prior to visit.     Past Medical History  Diagnosis Date  . Diabetes mellitus, insulin dependent (IDDM), uncontrolled   . Hypertension   . Coronary artery disease     a. Remote MI. b. CABG 1999.  Marland Kitchen Myocardial infarct   .  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 PAD (peripheral artery disease) 05/01/2012    a. 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 b. s/p extensive LLE angioplasty and stent placement x 2 to the SFA and angioplasty of anterior tibial and tibioperoneal trunck in 05/2012.  . Ischemic toe   . Hyperlipidemia   . Prostate ca     s/p radioactive seed implant  . CHF (congestive heart failure)   . Atrial fibrillation     a. Documented 11/2012.  . Transaminitis     a. Documented 11/2012.     Past Surgical History  Procedure  Laterality Date  . Leg amputation below knee      right  . Coronary artery bypass graft    . Right patella removal    . Right finger fracture    . Closed reduction clavicle fracture    . Angioplasty illiac artery  06/05/2012     Family History  Problem Relation Age of Onset  . Heart attack Mother      History   Social History  . Marital Status: Divorced    Spouse Name: N/A    Number of Children: N/A  . Years of Education: N/A   Occupational History  . Not on file.   Social History Main Topics  . Smoking status: Current Every Day Smoker -- 0.50 packs/day for 15 years  . Smokeless tobacco: Not on file  . Alcohol Use: Yes     Comment: occ  . Drug Use: Yes    Special: Marijuana  . Sexual Activity: Not on file   Other Topics Concern  . Not on file   Social History Narrative  . No narrative on file      PHYSICAL EXAM   Ht 6\' 3"  (1.905 m)  Wt 275 lb (124.739 kg)  BMI 34.37 kg/m2 Constitutional: He is oriented to person, place, and time. He appears well-developed and well-nourished. No distress.  HENT: No nasal discharge.  Head: Normocephalic and atraumatic.  Eyes: Pupils are equal and round. Right eye exhibits no discharge. Left eye exhibits no discharge.  Neck: Normal range of motion. Neck supple. No JVD present. No thyromegaly present.  Cardiovascular: Normal rate, regular rhythm, normal heart sounds and. Exam reveals no gallop and no friction rub. No murmur heard.  Pulmonary/Chest: Effort normal and breath sounds normal. No stridor. No respiratory distress. He has no wheezes. He has no rales. He exhibits no tenderness.  Abdominal: Soft. Bowel sounds are normal. He exhibits no distension. There is no tenderness. There is no rebound and no guarding.  Musculoskeletal: Normal range of motion. He exhibits no edema and no tenderness.  Neurological: He is alert and oriented to person, place, and time. Coordination normal.  Skin: Skin is warm and dry. No rash noted.  He is not diaphoretic. No erythema. No pallor.  Psychiatric: He has a normal mood and affect. His behavior is normal. Judgment and thought content normal.  Vascular: Femoral pulses are slightly diminished. Left dorsalis pedis pulse is nonpalpable. Left foot is warm. No ulceration.  ASSESSMENT AND PLAN

## 2013-03-04 NOTE — Assessment & Plan Note (Signed)
He seems to be doing reasonably well with no pain affecting his left leg. He is tolerating dual antiplatelet therapy but I recommend decreasing the dose of aspirin 81 mg once daily. Lower extremity arterial duplex is due in February of 2015.

## 2013-03-04 NOTE — Assessment & Plan Note (Signed)
Continue treatment with Toprol. He appears to be euvolemic on current dose of Lasix. I will consider adding Ramipril upon followup.

## 2013-03-11 ENCOUNTER — Ambulatory Visit: Payer: Medicare Other | Admitting: Cardiovascular Disease

## 2013-03-18 ENCOUNTER — Emergency Department (HOSPITAL_COMMUNITY): Payer: Medicare Other

## 2013-03-18 ENCOUNTER — Emergency Department (HOSPITAL_COMMUNITY)
Admission: EM | Admit: 2013-03-18 | Discharge: 2013-03-18 | Disposition: A | Discharge status: Home / Self Care | Payer: Medicare Other | Attending: Emergency Medicine | Admitting: Emergency Medicine

## 2013-03-18 ENCOUNTER — Encounter (HOSPITAL_COMMUNITY): Payer: Self-pay | Admitting: Emergency Medicine

## 2013-03-18 DIAGNOSIS — E119 Type 2 diabetes mellitus without complications: Secondary | ICD-10-CM | POA: Insufficient documentation

## 2013-03-18 DIAGNOSIS — I252 Old myocardial infarction: Secondary | ICD-10-CM | POA: Insufficient documentation

## 2013-03-18 DIAGNOSIS — R0609 Other forms of dyspnea: Secondary | ICD-10-CM | POA: Insufficient documentation

## 2013-03-18 DIAGNOSIS — I509 Heart failure, unspecified: Secondary | ICD-10-CM | POA: Insufficient documentation

## 2013-03-18 DIAGNOSIS — Z794 Long term (current) use of insulin: Secondary | ICD-10-CM | POA: Insufficient documentation

## 2013-03-18 DIAGNOSIS — S88119A Complete traumatic amputation at level between knee and ankle, unspecified lower leg, initial encounter: Secondary | ICD-10-CM | POA: Insufficient documentation

## 2013-03-18 DIAGNOSIS — I4891 Unspecified atrial fibrillation: Secondary | ICD-10-CM | POA: Insufficient documentation

## 2013-03-18 DIAGNOSIS — R0989 Other specified symptoms and signs involving the circulatory and respiratory systems: Secondary | ICD-10-CM | POA: Insufficient documentation

## 2013-03-18 DIAGNOSIS — Z7982 Long term (current) use of aspirin: Secondary | ICD-10-CM | POA: Insufficient documentation

## 2013-03-18 DIAGNOSIS — R0789 Other chest pain: Secondary | ICD-10-CM | POA: Insufficient documentation

## 2013-03-18 DIAGNOSIS — Z951 Presence of aortocoronary bypass graft: Secondary | ICD-10-CM | POA: Insufficient documentation

## 2013-03-18 DIAGNOSIS — E785 Hyperlipidemia, unspecified: Secondary | ICD-10-CM | POA: Insufficient documentation

## 2013-03-18 DIAGNOSIS — I2510 Atherosclerotic heart disease of native coronary artery without angina pectoris: Secondary | ICD-10-CM | POA: Insufficient documentation

## 2013-03-18 DIAGNOSIS — I739 Peripheral vascular disease, unspecified: Secondary | ICD-10-CM | POA: Insufficient documentation

## 2013-03-18 DIAGNOSIS — R9431 Abnormal electrocardiogram [ECG] [EKG]: Secondary | ICD-10-CM | POA: Insufficient documentation

## 2013-03-18 DIAGNOSIS — Z8546 Personal history of malignant neoplasm of prostate: Secondary | ICD-10-CM | POA: Insufficient documentation

## 2013-03-18 DIAGNOSIS — Z7902 Long term (current) use of antithrombotics/antiplatelets: Secondary | ICD-10-CM | POA: Insufficient documentation

## 2013-03-18 DIAGNOSIS — F17200 Nicotine dependence, unspecified, uncomplicated: Secondary | ICD-10-CM | POA: Insufficient documentation

## 2013-03-18 DIAGNOSIS — Z79899 Other long term (current) drug therapy: Secondary | ICD-10-CM | POA: Insufficient documentation

## 2013-03-18 LAB — BASIC METABOLIC PANEL
CO2: 25 mEq/L (ref 19–32)
Calcium: 8.8 mg/dL (ref 8.4–10.5)
Creatinine, Ser: 1.03 mg/dL (ref 0.50–1.35)
GFR calc Af Amer: 82 mL/min — ABNORMAL LOW (ref >90)
Glucose, Bld: 102 mg/dL — ABNORMAL HIGH (ref 70–99)
Sodium: 140 mEq/L (ref 135–145)

## 2013-03-18 LAB — CBC
HCT: 28.2 % — ABNORMAL LOW (ref 39.0–52.0)
MCH: 21.7 pg — ABNORMAL LOW (ref 26.0–34.0)
MCHC: 31.9 g/dL (ref 30.0–36.0)
MCV: 68.1 fL — ABNORMAL LOW (ref 78.0–100.0)
RDW: 18.3 % — ABNORMAL HIGH (ref 11.5–15.5)

## 2013-03-18 LAB — POCT I-STAT TROPONIN I

## 2013-03-18 MED ORDER — FUROSEMIDE 10 MG/ML IJ SOLN
40.0000 mg | Freq: Once | INTRAMUSCULAR | Status: AC
  Administered 2013-03-18: 40 mg via INTRAMUSCULAR
  Filled 2013-03-18: qty 4

## 2013-03-18 NOTE — ED Notes (Signed)
Pt states last night he felt very SOB while resting watching tv. States symptoms are gone now but he is concerned it may return. C/o mild chest pain now

## 2013-03-18 NOTE — ED Notes (Signed)
Called for pt X 2 

## 2013-03-18 NOTE — ED Notes (Signed)
Called for pt in lobby X 1

## 2013-03-18 NOTE — ED Provider Notes (Signed)
CSN: [id number]     Arrival date & time 03/18/13  1442 History   First MD Initiated Contact with Patient 03/18/13 1921     Chief Complaint  Patient presents with  . Shortness of Breath   (Consider location/radiation/quality/duration/timing/severity/associated sxs/prior Treatment) Patient is a 72 y.o. male presenting with shortness of breath. The history is provided by the patient.  Shortness of Breath He has been having some difficulty breathing for the last several days. He is status post leg amputation has not been fitted for a prosthesis yet, so is not very active. He sleeps sitting in a chair but states it is not because of difficulty breathing when he lays flat. He denies any chest pain, discomfort tightness, pressure. He denies fever, chills, sweats, cough. He has had similar episodes in the past and states that he was in the hospital with similar problems about 3 months ago. He is wondering if an inhaler might be helpful.  Past Medical History  Diagnosis Date  . Diabetes mellitus, insulin dependent (IDDM), uncontrolled   . Hypertension   . Coronary artery disease     a. Remote MI. b. CABG 1999.  Marland Kitchen Myocardial infarct   . 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 PAD (peripheral artery disease) 05/01/2012    a. 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 b. s/p extensive LLE angioplasty and stent placement x 2 to the SFA and angioplasty of anterior tibial and tibioperoneal trunck in 05/2012.  . Ischemic toe   . Hyperlipidemia   . Prostate ca     s/p radioactive seed implant  . CHF (congestive heart failure)   . Atrial fibrillation     a. Documented 11/2012.  . Transaminitis     a. Documented 11/2012.   Past Surgical History  Procedure Laterality Date  . Leg amputation below knee      right  . Coronary artery bypass graft    . Right patella removal    . Right finger fracture    . Closed reduction clavicle fracture    . Angioplasty illiac artery   06/05/2012   Family History  Problem Relation Age of Onset  . Heart attack Mother    History  Substance Use Topics  . Smoking status: Current Every Day Smoker -- 0.50 packs/day for 15 years  . Smokeless tobacco: Not on file  . Alcohol Use: Yes     Comment: occ    Review of Systems  Respiratory: Positive for shortness of breath.   All other systems reviewed and are negative.    Allergies  Review of patient's allergies indicates no known allergies.  Home Medications   Current Outpatient Rx  Name  Route  Sig  Dispense  Refill  . amLODipine (NORVASC) 10 MG tablet   Oral   Take 10 mg by mouth daily.         Marland Kitchen aspirin 81 MG tablet   Oral   Take 1 tablet (81 mg total) by mouth daily.         Marland Kitchen atorvastatin (LIPITOR) 20 MG tablet   Oral   Take 20 mg by mouth daily.         . clopidogrel (PLAVIX) 75 MG tablet   Oral   Take 75 mg by mouth daily with breakfast.         . furosemide (LASIX) 20 MG tablet   Oral   Take 20 mg by mouth every morning.          Marland Kitchen  insulin NPH-regular (NOVOLIN 70/30) (70-30) 100 UNIT/ML injection   Subcutaneous   Inject 40 Units into the skin 2 (two) times daily with a meal. Inject 40 units in the am and 40 units in the pm         . KLOR-CON M10 10 MEQ tablet   Oral   Take 10 mEq by mouth daily.         Marland Kitchen lisinopril (PRINIVIL,ZESTRIL) 40 MG tablet   Oral   Take 40 mg by mouth daily.         . metoprolol succinate (TOPROL-XL) 100 MG 24 hr tablet   Oral   Take 100 mg by mouth daily. Take with or immediately following a meal.          BP 162/67  Pulse 72  Temp(Src) 97.9 F (36.6 C) (Oral)  Resp 19  Ht 6\' 3"  (1.905 m)  Wt 280 lb (127.007 kg)  BMI 35.00 kg/m2  SpO2 94% Physical Exam  Nursing note and vitals reviewed.  72 year old male, resting comfortably and in no acute distress. Vital signs are significant for hypertension with blood pressure 162/67. Oxygen saturation is 94%, which is normal. Head is  normocephalic and atraumatic. PERRLA, EOMI. Oropharynx is clear. Neck is nontender and supple without adenopathy or JVD. Back is nontender and there is no CVA tenderness. Lungs have fine bibasilar rales without wheezes or rhonchi. Chest is nontender. Heart has regular rate and rhythm without murmur. Abdomen is soft, flat, nontender without masses or hepatosplenomegaly and peristalsis is normoactive. Extremities: There is right below the knee amputation. Left leg has 1+ edema and there is trace presacral edema. There is no cyanosis. Skin is warm and dry without rash. Neurologic: Mental status is normal, cranial nerves are intact, there are no motor or sensory deficits.  ED Course  Procedures (including critical care time) Labs Review Labs Reviewed  BASIC METABOLIC PANEL - Abnormal; Notable for the following:    Glucose, Bld 102 (*)    GFR calc non Af Amer 71 (*)    GFR calc Af Amer 82 (*)    All other components within normal limits  PRO B NATRIURETIC PEPTIDE - Abnormal; Notable for the following:    Pro B Natriuretic peptide (BNP) 3519.0 (*)    All other components within normal limits  CBC - Abnormal; Notable for the following:    RBC 4.14 (*)    Hemoglobin 9.0 (*)    HCT 28.2 (*)    MCV 68.1 (*)    MCH 21.7 (*)    RDW 18.3 (*)    All other components within normal limits  POCT I-STAT TROPONIN I   Imaging Review Dg Chest 2 View  03/18/2013   CLINICAL DATA:  Dyspnea and chest discomfort. History of previous MI and CHF.  EXAM: CHEST  2 VIEW  COMPARISON:  November 23, 2012.  FINDINGS: The lungs are well-expanded with mild hemidiaphragm flattening. The cardiac silhouette is enlarged. The pulmonary vascularity is prominent bilaterally but has significantly decreased in conspicuity since the previous study. There is no alveolar edema today. Minimal interstitial edema secondary to low grade CHF is suspected. There is no evidence of pneumonia. The patient has undergone previous median  sternotomy and CABG.  IMPRESSION: The findings are consistent with low grade CHF with mild interstitial edema. There may be underlying COPD.   Electronically Signed   By: Jahzeel Poythress  [place]   On: 03/18/2013 16:06    EKG Interpretation    Date/Time:  Tuesday March 18 2013 14:48:20 EST Ventricular Rate:  68 PR Interval:  198 QRS Duration: 102 QT Interval:  428 QTC Calculation: 455 R Axis:   90 Text Interpretation:  Normal sinus rhythm Rightward axis Nonspecific T wave abnormality Abnormal ECG When compared with ECG of 11/22/2012, Sinus rhythm has replaced Atrial fibrillation Nonspecific T wave abnormality is now Present Confirmed by [facility]  MD, Cornelious Diven ([id number]) on 03/18/2013 7:04:39 PM            MDM   1. CHF exacerbation    Probable exacerbation of congestive heart failure. Old records are reviewed and his hospitalization in July was for congestive heart failure. BNP is about the same level as when he had his exacerbation of congestive heart failure. He is on a relatively low dose of furosemide. He is given a dose of furosemide in the ED and is advised to increase his home furosemide dose to 40 mg a day for the next 3 days before returning to his usual 20 mg a day. His followup with his PCP.    Dione Booze, MD 03/18/13 Barry Brunner

## 2013-03-25 ENCOUNTER — Ambulatory Visit (HOSPITAL_COMMUNITY): Payer: Medicare Other | Attending: Cardiovascular Disease | Admitting: Radiology

## 2013-03-25 VITALS — BP 159/62 | HR 58 | Ht 75.0 in | Wt 285.0 lb

## 2013-03-25 DIAGNOSIS — I10 Essential (primary) hypertension: Secondary | ICD-10-CM | POA: Insufficient documentation

## 2013-03-25 DIAGNOSIS — I252 Old myocardial infarction: Secondary | ICD-10-CM | POA: Insufficient documentation

## 2013-03-25 DIAGNOSIS — E785 Hyperlipidemia, unspecified: Secondary | ICD-10-CM | POA: Insufficient documentation

## 2013-03-25 DIAGNOSIS — R0989 Other specified symptoms and signs involving the circulatory and respiratory systems: Secondary | ICD-10-CM | POA: Insufficient documentation

## 2013-03-25 DIAGNOSIS — J45909 Unspecified asthma, uncomplicated: Secondary | ICD-10-CM | POA: Insufficient documentation

## 2013-03-25 DIAGNOSIS — I739 Peripheral vascular disease, unspecified: Secondary | ICD-10-CM | POA: Insufficient documentation

## 2013-03-25 DIAGNOSIS — R0602 Shortness of breath: Secondary | ICD-10-CM

## 2013-03-25 DIAGNOSIS — I4891 Unspecified atrial fibrillation: Secondary | ICD-10-CM | POA: Insufficient documentation

## 2013-03-25 DIAGNOSIS — Z951 Presence of aortocoronary bypass graft: Secondary | ICD-10-CM | POA: Insufficient documentation

## 2013-03-25 DIAGNOSIS — Z794 Long term (current) use of insulin: Secondary | ICD-10-CM | POA: Insufficient documentation

## 2013-03-25 DIAGNOSIS — I2510 Atherosclerotic heart disease of native coronary artery without angina pectoris: Secondary | ICD-10-CM | POA: Insufficient documentation

## 2013-03-25 DIAGNOSIS — Z8249 Family history of ischemic heart disease and other diseases of the circulatory system: Secondary | ICD-10-CM | POA: Insufficient documentation

## 2013-03-25 DIAGNOSIS — E119 Type 2 diabetes mellitus without complications: Secondary | ICD-10-CM | POA: Insufficient documentation

## 2013-03-25 DIAGNOSIS — R0609 Other forms of dyspnea: Secondary | ICD-10-CM | POA: Insufficient documentation

## 2013-03-25 DIAGNOSIS — F17200 Nicotine dependence, unspecified, uncomplicated: Secondary | ICD-10-CM | POA: Insufficient documentation

## 2013-03-25 DIAGNOSIS — Z8673 Personal history of transient ischemic attack (TIA), and cerebral infarction without residual deficits: Secondary | ICD-10-CM | POA: Insufficient documentation

## 2013-03-25 MED ORDER — TECHNETIUM TC 99M SESTAMIBI GENERIC - CARDIOLITE
10.0000 | Freq: Once | INTRAVENOUS | Status: AC | PRN
  Administered 2013-03-25: 10 via INTRAVENOUS

## 2013-03-25 MED ORDER — TECHNETIUM TC 99M SESTAMIBI GENERIC - CARDIOLITE
30.0000 | Freq: Once | INTRAVENOUS | Status: AC | PRN
  Administered 2013-03-25: 30 via INTRAVENOUS

## 2013-03-25 MED ORDER — REGADENOSON 0.4 MG/5ML IV SOLN
0.4000 mg | Freq: Once | INTRAVENOUS | Status: AC
  Administered 2013-03-25: 0.4 mg via INTRAVENOUS

## 2013-03-25 NOTE — Progress Notes (Signed)
[facility] SITE 3 NUCLEAR MED [address] [place], [place] [address] [phone]    Cardiology Nuclear Med Study  Philip Strong is a 72 y.o. male     MRN : [id number]     DOB: 05/19/1940  Procedure Date: 03/25/2013  Nuclear Med Background Indication for Stress Test:  Evaluation for Ischemia, Graft Patency, and Patient seen in hospital on 03-18-13 for DOE, enzymes negative History:  CAD, MI, CABG 1999, Afib, Echo 2014 EF 45-50%, Asthma Cardiac Risk Factors: CVA, Family History - CAD, Hypertension, IDDM, Lipids, PVD and Smoker  Symptoms:  DOE   Nuclear Pre-Procedure Caffeine/Decaff Intake:  None > 12 hrs NPO After: 8:00pm   Lungs:  clear O2 Sat: 99% on room air. IV 0.9% NS with Angio Cath:  22g  IV Site: L Wrist, tolerated well IV Started by:  Doyne Keel, CNMT  Chest Size (in):  48 Cup Size: n/a  Height: 6\' 3"  (1.905 m)  Weight:  285 lb (129.275 kg)  BMI:  Body mass index is 35.62 kg/(m^2). Tech Comments:  Held Toprol x 24 hrs. 1/2 dose Novolin 70/30 insulin last night, no insulin today. Fasting CBG on arrival was 255, water given.Irean Hong, RN.    Nuclear Med Study 1 or 2 day study: 1 day  Stress Test Type:  [name]  Reading MD: [name], MD  Order Authorizing Provider:  Lorine Bears, MD  Resting Radionuclide: Technetium 78m Sestamibi  Resting Radionuclide Dose: 11.0 mCi   Stress Radionuclide:  Technetium 4m Sestamibi  Stress Radionuclide Dose: 33.0 mCi           Stress Protocol Rest HR: 58 Stress HR: 80  Rest BP: 159/62 Stress BP: 161/61  Exercise Time (min): n/a METS: n/a   Predicted Max HR: 148 bpm % Max HR: 54.05 bpm Rate Pressure Product: 21308   Dose of Adenosine (mg):  n/a Dose of Lexiscan: 0.4 mg  Dose of Atropine (mg): n/a Dose of Dobutamine: n/a mcg/kg/min (at max HR)  Stress Test Technologist: Nelson Chimes, BS-ES  Nuclear Technologist:  Domenic Polite, CNMT     Rest Procedure:  Myocardial perfusion imaging was performed at  rest 45 minutes following the intravenous administration of Technetium 50m Sestamibi. Rest ECG: Sinus bradycardia, RAD, nonspecific ST changes.  Stress Procedure:  The patient received IV Lexiscan 0.4 mg over 15-seconds.  Technetium 77m Sestamibi injected at 30-seconds.  Quantitative spect images were obtained after a 45 minute delay. During the infusion of Lexiscan, patient complained of SOB.  This resolved in recovery.  Stress ECG: No significant ST segment change suggestive of ischemia.  QPS Raw Data Images:  Acquisition technically good; LVE. Stress Images:  There is decreased uptake in the inferior lateral wall. Rest Images:  There is decreased uptake in the inferior lateral wall, less prominent compared to the stress images. Subtraction (SDS):  These findings are consistent with prior infarct and mild peri-infarct ischemia. Transient Ischemic Dilatation (Normal <1.22):  1.03 Lung/Heart Ratio (Normal <0.45):  0.29  Quantitative Gated Spect Images QGS EDV:  255 ml QGS ESV:  164 ml  Impression Exercise Capacity:  Lexiscan with no exercise. BP Response:  Normal blood pressure response. Clinical Symptoms:  There is dyspnea. ECG Impression:  No significant ST segment change suggestive of ischemia. Comparison with Prior Nuclear Study: No previous nuclear study performed  Overall Impression:  High risk stress nuclear study with a large, severe, partially reversible inferior lateral defect consistent with prior infarct and mild peri-infarct ischemia.  LV  Ejection Fraction: 35%.  LV Wall Motion:  Akinesis of the inferior lateral wall.   [name]

## 2013-04-08 ENCOUNTER — Ambulatory Visit (INDEPENDENT_AMBULATORY_CARE_PROVIDER_SITE_OTHER): Payer: Medicare Other | Admitting: Cardiovascular Disease

## 2013-04-08 ENCOUNTER — Encounter: Payer: Self-pay | Admitting: Cardiovascular Disease

## 2013-04-08 VITALS — BP 146/65 | HR 61 | Ht 75.0 in | Wt 285.0 lb

## 2013-04-08 DIAGNOSIS — I2510 Atherosclerotic heart disease of native coronary artery without angina pectoris: Secondary | ICD-10-CM

## 2013-04-08 DIAGNOSIS — I739 Peripheral vascular disease, unspecified: Secondary | ICD-10-CM

## 2013-04-08 DIAGNOSIS — I5022 Chronic systolic (congestive) heart failure: Secondary | ICD-10-CM

## 2013-04-08 MED ORDER — FUROSEMIDE 40 MG PO TABS: 40.0000 mg | ORAL_TABLET | Freq: Every day | ORAL | Status: AC

## 2013-04-08 NOTE — Assessment & Plan Note (Signed)
Stress test was abnormal with evidence of prior infarct in the inferior and inferolateral walls with mild peri-infarct ischemia. The stress test is high risk mainly due to low ejection fraction. No large areas of ischemia as well noted. Given that he is not having chest pain and has multiple comorbidities, I favor continuing medical therapy for now.

## 2013-04-08 NOTE — Progress Notes (Signed)
HPI  This is a 72 year old African American male is here today for a followup visit regarding peripheral arterial and coronary artery disease.  He has known history of insulin-dependent diabetes mellitus, hypertension, and tobacco use. He has a history of coronary disease with remote myocardial infarction. He underwent coronary bypass surgery apparently in 1999.  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 He was seen  in January of 2014 with rest pain involving the left leg with toe ulceration and early gangrenous changes. He underwent angiography which showed severe diffuse mid left SFA disease as well as one-vessel runoff below the knee with 99% stenosis in the anterior tibial artery which was the only artery supplying the foot.  He underwent a staged endovascular intervention on the left lower extremity under general anesthesia due to inability to safely sedate him during his diagnostic angiography. The procedure was done via an antegrade access of the left femoral artery which was extremely difficult due to his obesity. I was able to perform balloon angioplasty on the left anterior tibial and tibioperoneal trunk. The left SFA was diffusely diseased and I placed 2 self-expanding stents. He was hospitalized at [facility] in August with acute on chronic heart failure, anemia with heme-positive stool requiring transfusion, acute renal failure, rest pain affecting his left foot.  I proceeded with angiography which showed significant restenosis in the left SFA stent with patent anterior tibial artery. I performed balloon angioplasty of the left SFA with significant improvement. The procedure was very difficult due to access issues as well as difficulty sedating him.  I proceeded recently with a pharmacologic nuclear stress test due to symptoms of heart failure and continued exertional dyspnea. This showed evidence of prior infarct in the inferior and inferolateral walls with mild peri-infarct  ischemia and ejection fraction of 35%. The patient denies any chest discomfort. He continues to have significant dyspnea and orthopnea.  No Known Allergies   Current Outpatient Prescriptions on File Prior to Visit  Medication Sig Dispense Refill  . amLODipine (NORVASC) 10 MG tablet Take 10 mg by mouth daily.      Marland Kitchen aspirin 81 MG tablet Take 1 tablet (81 mg total) by mouth daily.      Marland Kitchen atorvastatin (LIPITOR) 20 MG tablet Take 20 mg by mouth daily.      . clopidogrel (PLAVIX) 75 MG tablet Take 75 mg by mouth daily with breakfast.      . insulin NPH-regular (NOVOLIN 70/30) (70-30) 100 UNIT/ML injection Inject 40 Units into the skin 2 (two) times daily with a meal. Inject 40 units in the am and 40 units in the pm      . KLOR-CON M10 10 MEQ tablet Take 10 mEq by mouth daily.      Marland Kitchen lisinopril (PRINIVIL,ZESTRIL) 40 MG tablet Take 40 mg by mouth daily.      . metoprolol succinate (TOPROL-XL) 100 MG 24 hr tablet Take 100 mg by mouth daily. Take with or immediately following a meal.       No current facility-administered medications on file prior to visit.     Past Medical History  Diagnosis Date  . Diabetes mellitus, insulin dependent (IDDM), uncontrolled   . Hypertension   . Coronary artery disease     a. Remote MI. b. CABG 1999.  Marland Kitchen Myocardial infarct   . 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 PAD (peripheral artery disease) 05/01/2012  a. 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 b. s/p extensive LLE angioplasty and stent placement x 2 to the SFA and angioplasty of anterior tibial and tibioperoneal trunck in 05/2012.  . Ischemic toe   . Hyperlipidemia   . Prostate ca     s/p radioactive seed implant  . CHF (congestive heart failure)   . Atrial fibrillation     a. Documented 11/2012.  . Transaminitis     a. Documented 11/2012.     Past Surgical History  Procedure Laterality Date  . Leg amputation below knee      right  . Coronary artery bypass graft     . Right patella removal    . Right finger fracture    . Closed reduction clavicle fracture    . Angioplasty illiac artery  06/05/2012     Family History  Problem Relation Age of Onset  . Heart attack Mother      History   Social History  . Marital Status: Divorced    Spouse Name: N/A    Number of Children: N/A  . Years of Education: N/A   Occupational History  . Not on file.   Social History Main Topics  . Smoking status: Current Every Day Smoker -- 0.50 packs/day for 15 years  . Smokeless tobacco: Not on file  . Alcohol Use: Yes     Comment: occ  . Drug Use: Yes    Special: Marijuana  . Sexual Activity: Not on file   Other Topics Concern  . Not on file   Social History Narrative  . No narrative on file      PHYSICAL EXAM   BP 146/65  Pulse 61  Ht 6\' 3"  (1.905 m)  Wt 285 lb (129.275 kg)  BMI 35.62 kg/m2 Constitutional: He is oriented to person, place, and time. He appears well-developed and well-nourished. No distress.  HENT: No nasal discharge.  Head: Normocephalic and atraumatic.  Eyes: Pupils are equal and round. Right eye exhibits no discharge. Left eye exhibits no discharge.  Neck: Normal range of motion. Neck supple. No JVD present. No thyromegaly present.  Cardiovascular: Normal rate, regular rhythm, normal heart sounds and. Exam reveals no gallop and no friction rub. No murmur heard.  Pulmonary/Chest: Effort normal and breath sounds normal. No stridor. No respiratory distress. He has no wheezes. He has no rales. He exhibits no tenderness.  Abdominal: Soft. Bowel sounds are normal. He exhibits no distension. There is no tenderness. There is no rebound and no guarding.  Musculoskeletal: Normal range of motion. He exhibits no edema and no tenderness.  Neurological: He is alert and oriented to person, place, and time. Coordination normal.  Skin: Skin is warm and dry. No rash noted. He is not diaphoretic. No erythema. No pallor.  Psychiatric: He has  a normal mood and affect. His behavior is normal. Judgment and thought content normal.  Vascular: Femoral pulses are slightly diminished. Left dorsalis pedis pulse is nonpalpable. Left foot is warm. No ulceration.  ASSESSMENT AND PLAN

## 2013-04-08 NOTE — Patient Instructions (Addendum)
Your physician has recommended you make the following change in your medication:   1. Increase lasix to 40 mg daily  Your physician has requested that you have a lower  extremity arterial duplex in 1 month. This test is an ultrasound of the arteries in the  arms. It looks at arterial blood flow in the legs and arms. Allow one hour for Lower and Upper Arterial scans. There are no restrictions or special instructions  Your physician recommends that you schedule a follow-up appointment in: 1 month with Dr. Kirke Corin

## 2013-04-08 NOTE — Assessment & Plan Note (Signed)
He is having mild discomfort in the left toes. There is chronic dark discoloration at the tips. He is at high risk for restenosis and has only one vessel below the knee. I will request lower extremity arterial duplex to evaluate the left leg. I will keep him on dual antiplatelet therapy long-term as tolerated.

## 2013-04-08 NOTE — Assessment & Plan Note (Signed)
He appears to be mildly fluid overloaded. I increased the dose of Lasix to 40 mg once daily.

## 2013-04-24 DEATH — deceased

## 2013-04-29 ENCOUNTER — Other Ambulatory Visit (HOSPITAL_COMMUNITY): Payer: Self-pay | Admitting: Cardiology

## 2013-04-29 DIAGNOSIS — I739 Peripheral vascular disease, unspecified: Secondary | ICD-10-CM

## 2013-05-05 ENCOUNTER — Encounter (HOSPITAL_COMMUNITY): Payer: Medicare Other

## 2013-05-05 DIAGNOSIS — R0989 Other specified symptoms and signs involving the circulatory and respiratory systems: Secondary | ICD-10-CM

## 2013-05-13 ENCOUNTER — Ambulatory Visit: Payer: Medicare Other | Admitting: Cardiovascular Disease

## 2013-06-10 ENCOUNTER — Ambulatory Visit: Payer: Medicare Other | Admitting: Cardiovascular Disease

## 2014-04-02 ENCOUNTER — Encounter (HOSPITAL_COMMUNITY): Payer: Self-pay | Admitting: Cardiovascular Disease

## 2014-11-05 IMAGING — CT CT HEAD W/O CM
2 series · 15 of 30 positions shown, 19 images · non-contrast
Comparison: CT of the head performed 02/18/2004

CLINICAL DATA: Hyperglycemia; moderate headache.

CT HEAD WITHOUT CONTRAST
TECHNIQUE: Contiguous axial images were obtained from the base of
the skull through the vertex without contrast.

[Series 3: head w/o · axial · non-contrast · 0.49mm/px · z∈[+181,+311]mm · 13 of 32 slices shown, 17 images]
[im 3/32  brain]
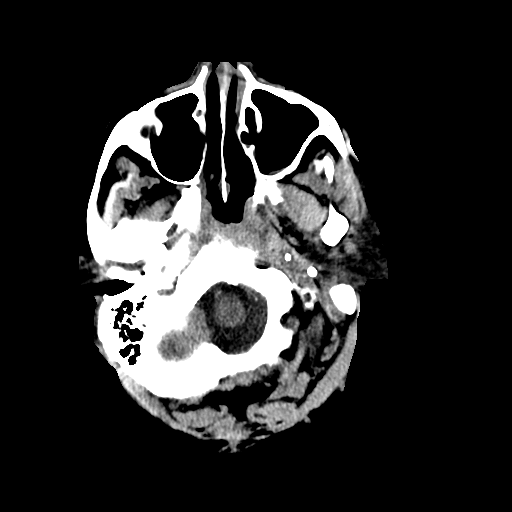
[im 3/32  bone]
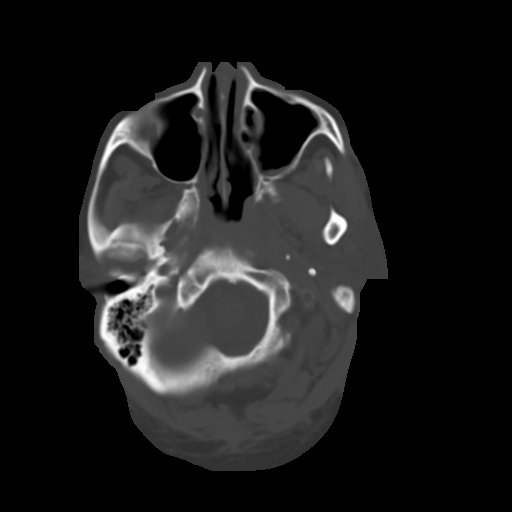
[im 5/32  brain]
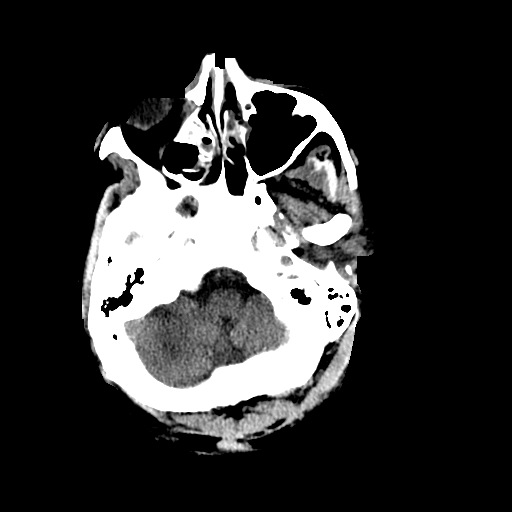
[im 7/32  brain]
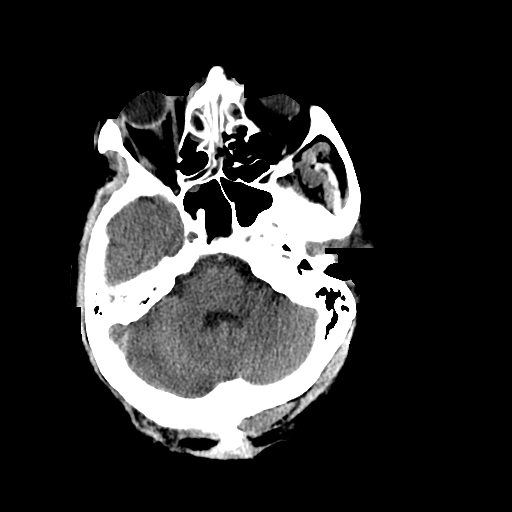
[im 9/32  brain]
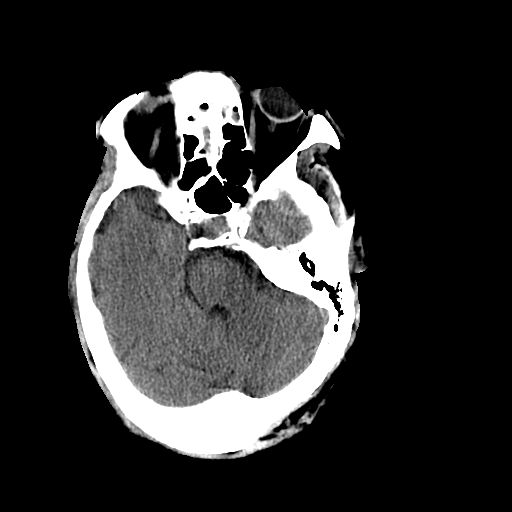
[im 12/32  brain]
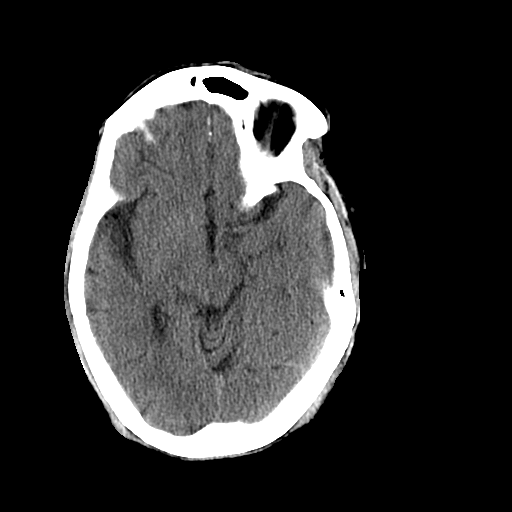
[im 12/32  bone]
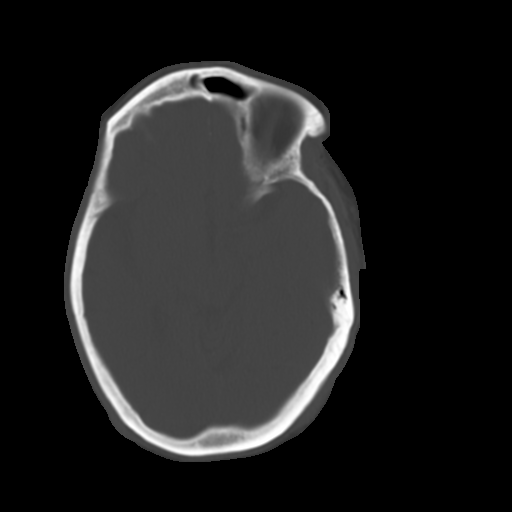
[im 14/32  brain]
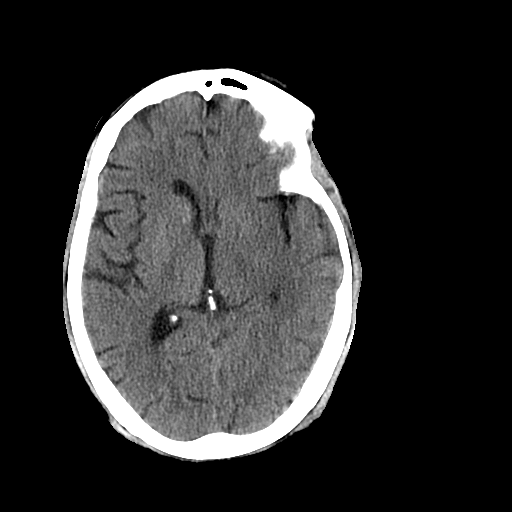
[im 16/32  brain]
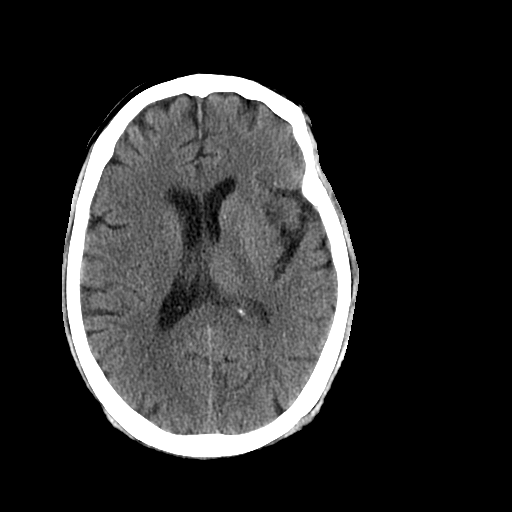
[im 18/32  brain]
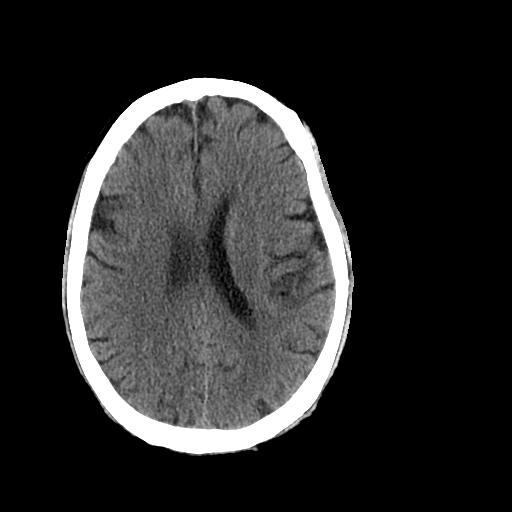
[im 20/32  brain]
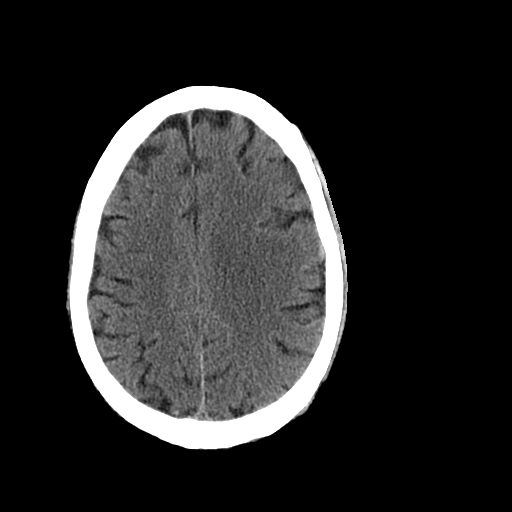
[im 20/32  bone]
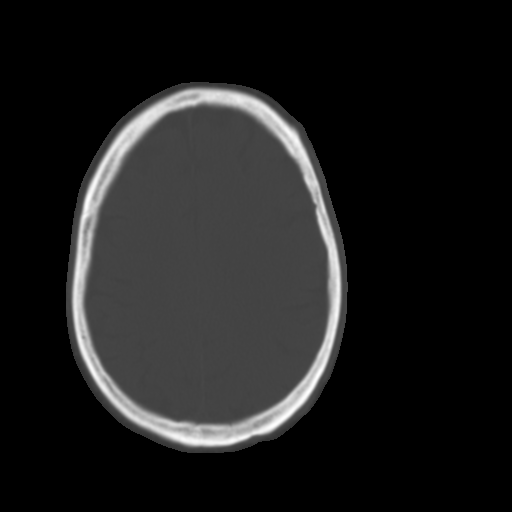
[im 23/32  brain]
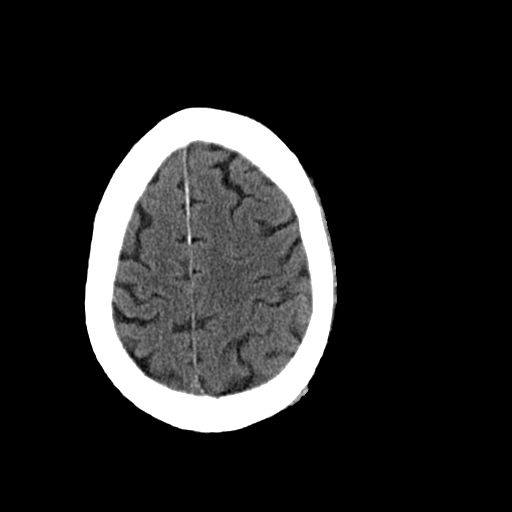
[im 25/32  brain]
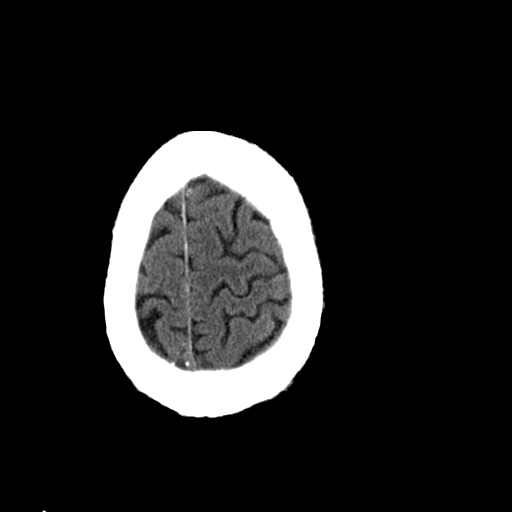
[im 27/32  brain]
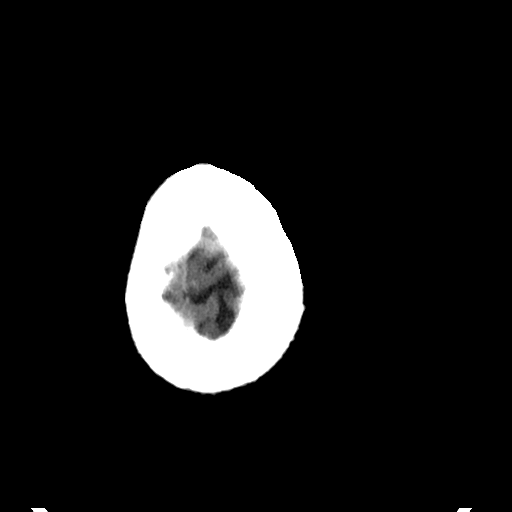
[im 29/32  brain]
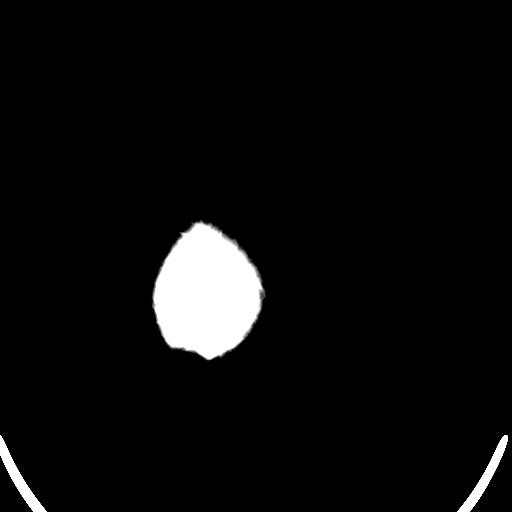
[im 29/32  bone]
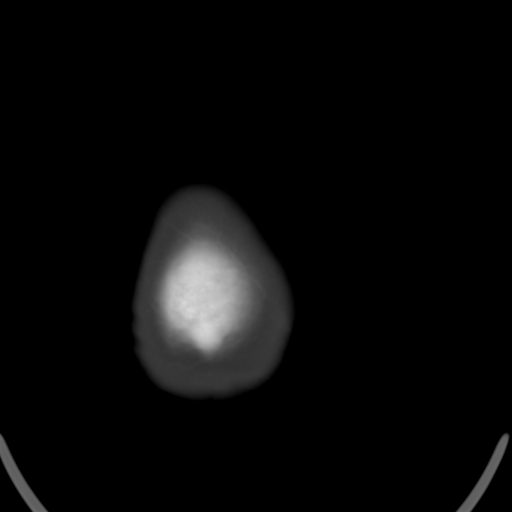

[Series 4: head w/o bone · axial · non-contrast · 0.49mm/px · z∈[+181,+201]mm · 2 of 32 slices shown]
[im 3/32  bone]
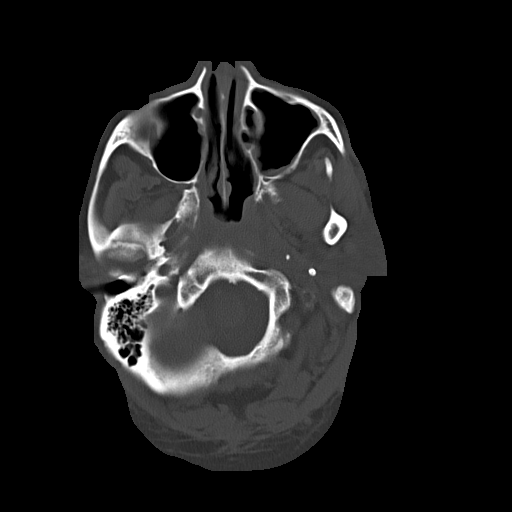
[im 7/32  bone]
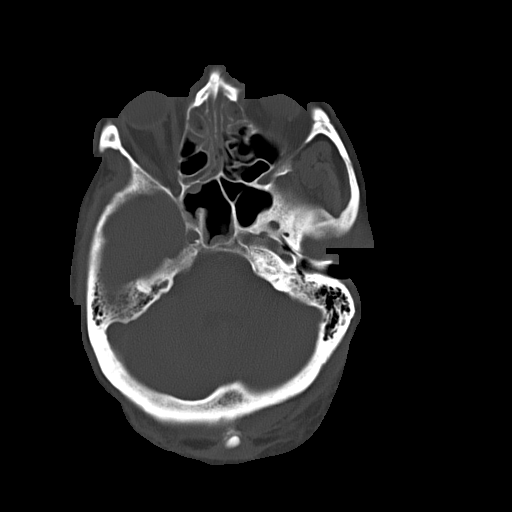

[15 of 30 positions shown; findings below may reference images not displayed]

FINDINGS: There is no evidence of acute infarction, mass lesion, or
intra- or extra-axial hemorrhage on CT.

The posterior fossa, including the cerebellum, brainstem and fourth
ventricle, is within normal limits.  The third and lateral
ventricles, and basal ganglia are unremarkable in appearance.  The
cerebral hemispheres are symmetric in appearance, with normal gray-
white differentiation.  No mass effect or midline shift is seen.

There is no evidence of fracture; visualized osseous structures are
unremarkable in appearance.  The orbits are within normal limits.
Mucosal thickening is noted at the left maxillary sinus, sphenoid
sinus and ethmoid air cells.  The remaining paranasal sinuses and
mastoid air cells are well-aerated.  No significant soft tissue
abnormalities are seen.
IMPRESSION: 1.  No acute intracranial pathology seen on CT.
2.  Mucosal thickening at the left maxillary sinus, sphenoid sinus
and ethmoid air cells.

## 2014-11-21 IMAGING — CR DG CHEST 2V
2 series · 2 of 2 positions shown · non-contrast
Comparison: 11/06/2012

CLINICAL DATA: CHF.

CHEST - 2 VIEW

[w chest lat]
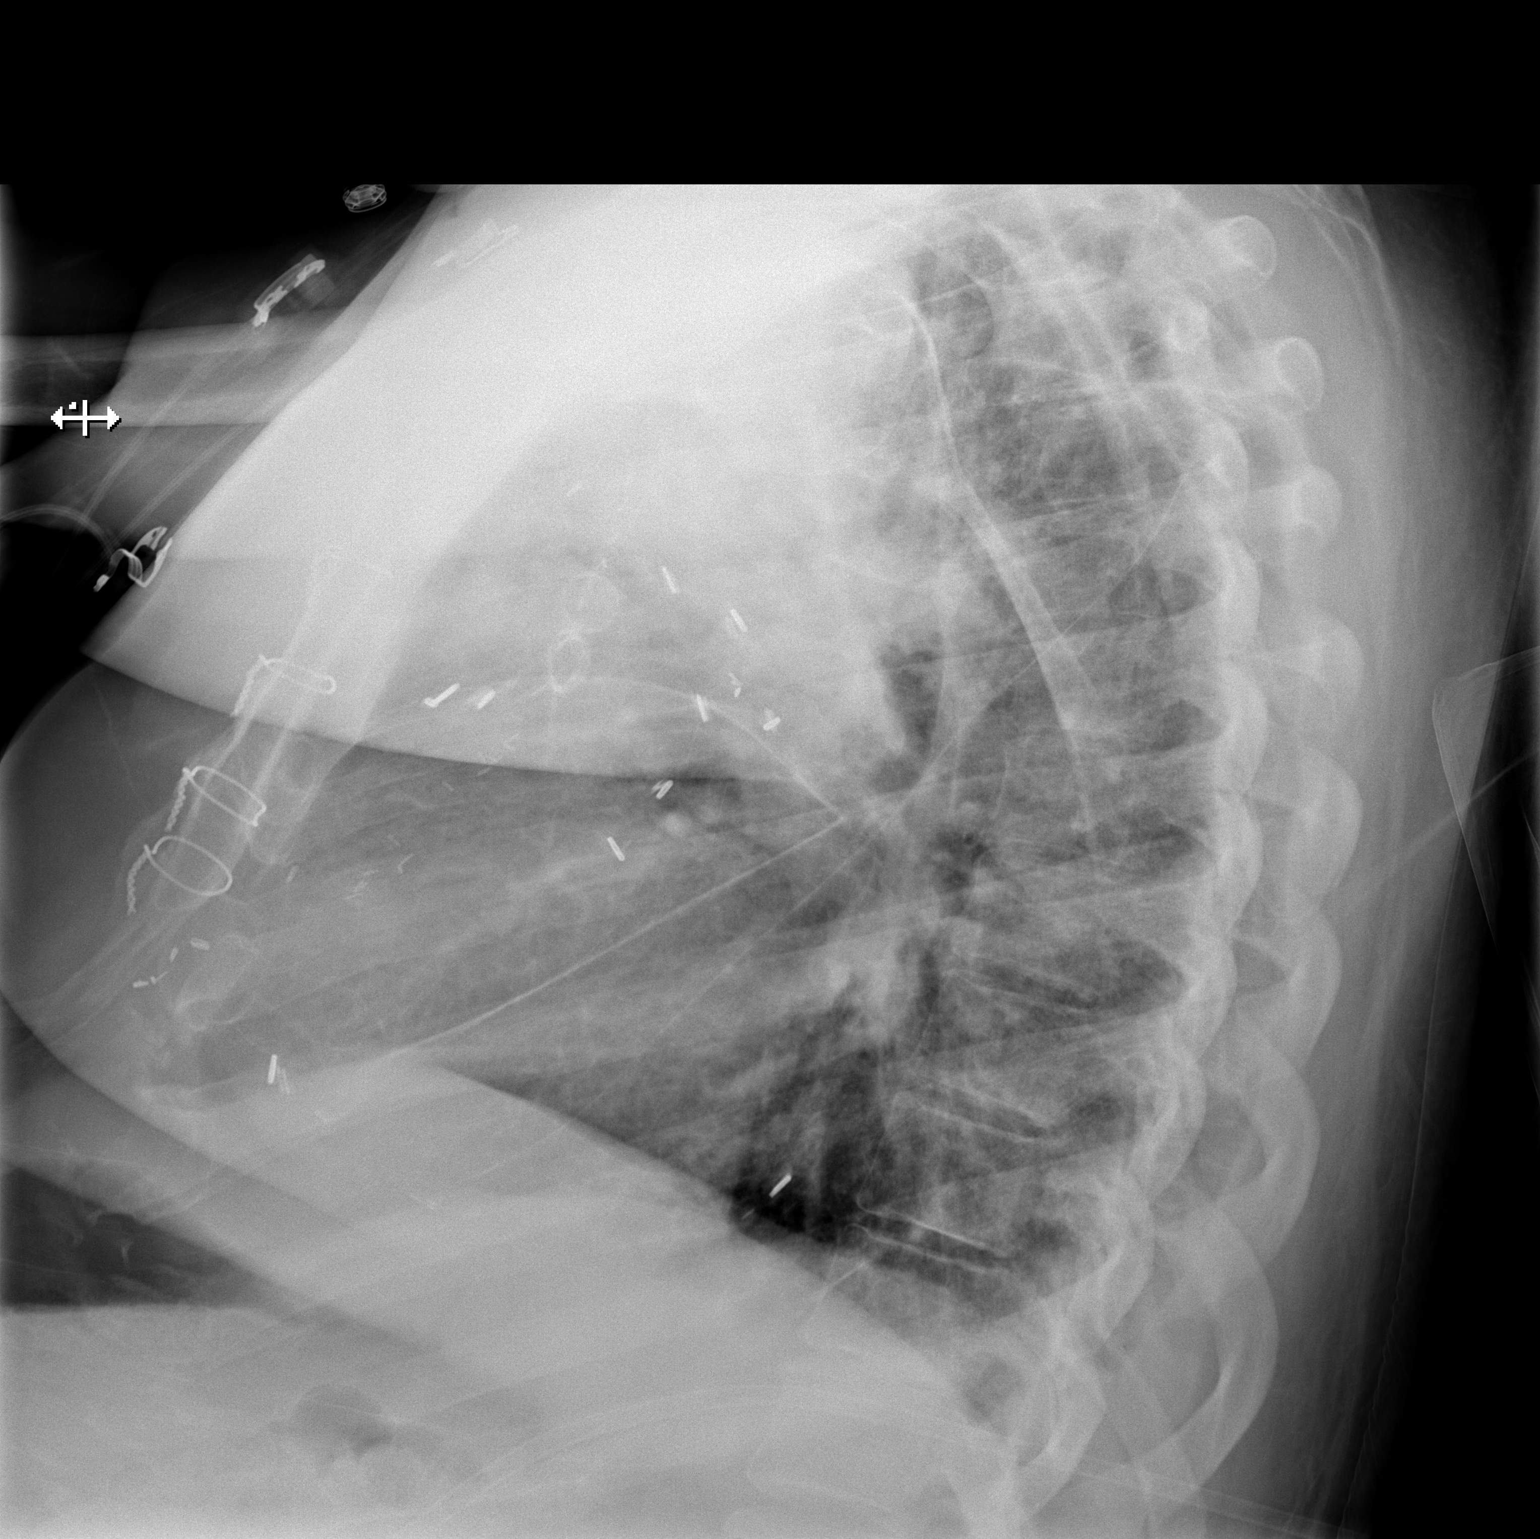

[x chest ap]
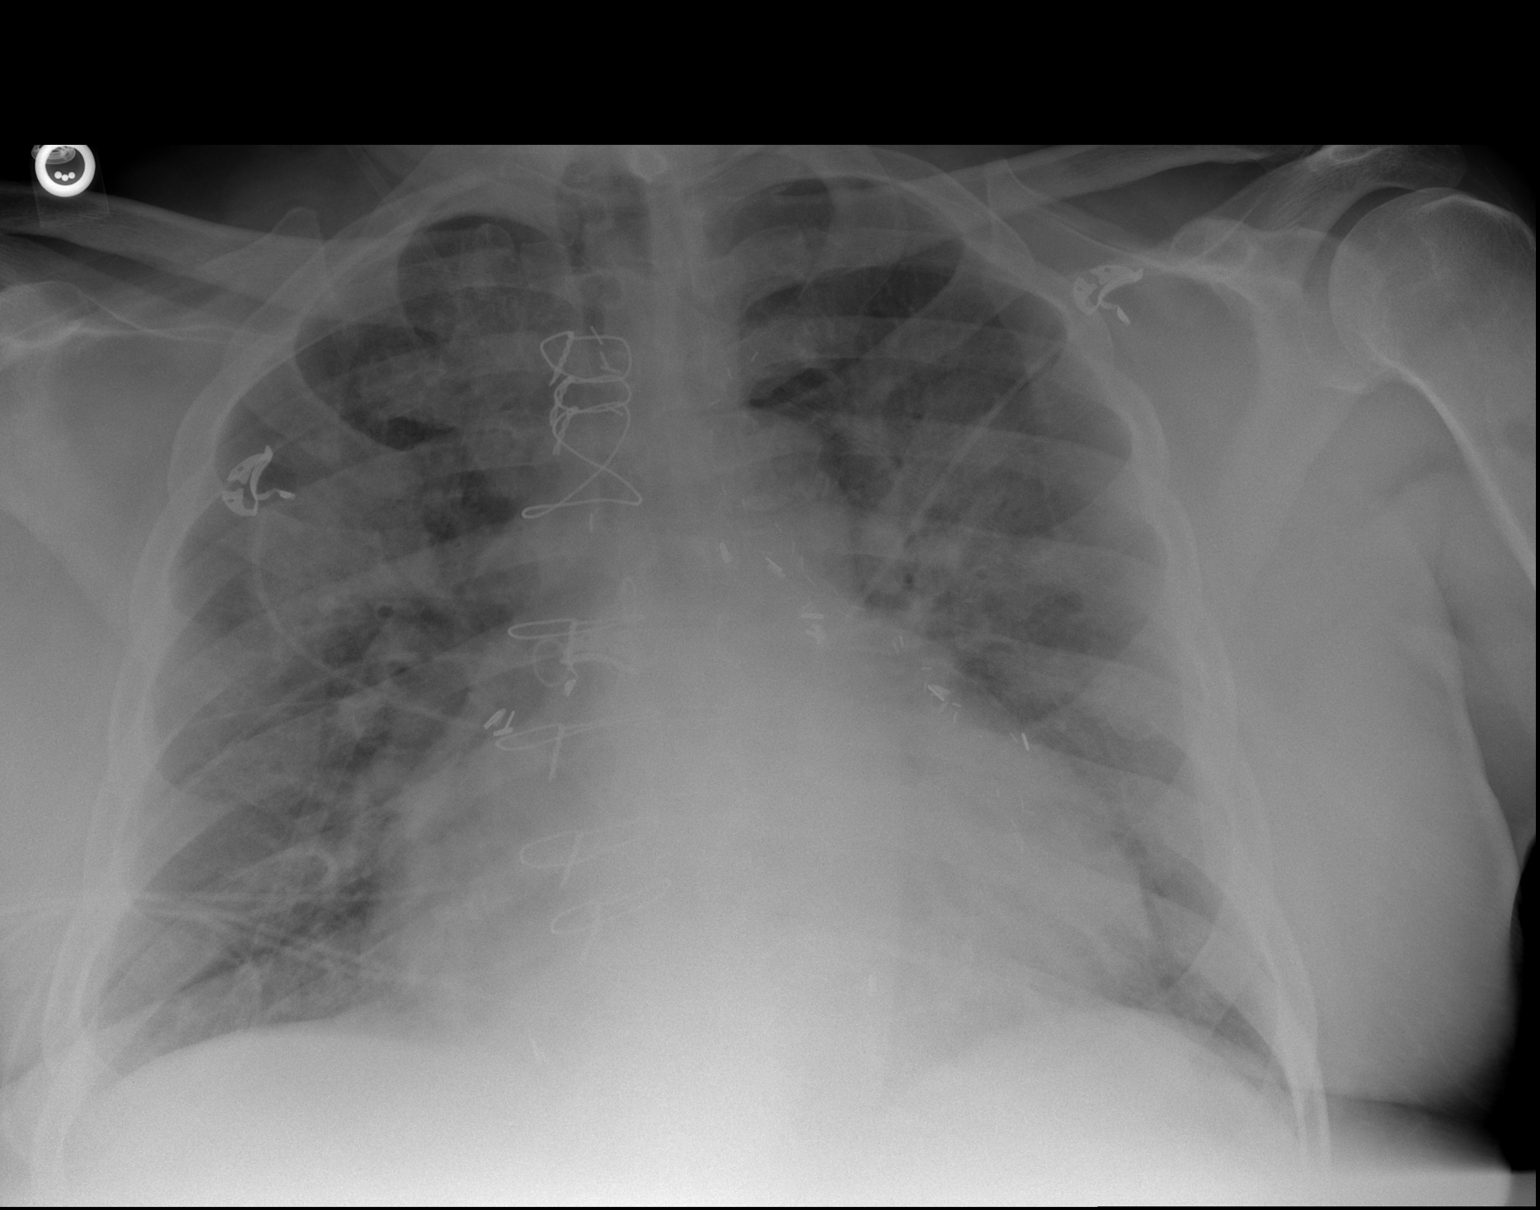

[2 of 2 positions shown; findings below may reference images not displayed]

FINDINGS: Mild to moderate cardiomegaly.  Diffuse interstitial and
alveolar opacities compatible with CHF.  Prior CABG.  No effusions.
No acute bony abnormality.
IMPRESSION: Mild CHF.
